# Patient Record
Sex: Female | Born: 1945
Health system: Southern US, Community
[De-identification: ages and names within clinical notes are randomized; demographics above are authoritative.]

## PROBLEM LIST (undated history)

## (undated) DIAGNOSIS — Z201 Contact with and (suspected) exposure to tuberculosis: Secondary | ICD-10-CM

## (undated) DIAGNOSIS — I10 Essential (primary) hypertension: Secondary | ICD-10-CM

## (undated) DIAGNOSIS — Z205 Contact with and (suspected) exposure to viral hepatitis: Secondary | ICD-10-CM

## (undated) DIAGNOSIS — N814 Uterovaginal prolapse, unspecified: Secondary | ICD-10-CM

## (undated) DIAGNOSIS — N390 Urinary tract infection, site not specified: Secondary | ICD-10-CM

## (undated) DIAGNOSIS — D219 Benign neoplasm of connective and other soft tissue, unspecified: Secondary | ICD-10-CM

## (undated) DIAGNOSIS — C801 Malignant (primary) neoplasm, unspecified: Secondary | ICD-10-CM

## (undated) DIAGNOSIS — E119 Type 2 diabetes mellitus without complications: Secondary | ICD-10-CM

## (undated) DIAGNOSIS — D649 Anemia, unspecified: Secondary | ICD-10-CM

## (undated) DIAGNOSIS — D179 Benign lipomatous neoplasm, unspecified: Secondary | ICD-10-CM

## (undated) DIAGNOSIS — Z923 Personal history of irradiation: Secondary | ICD-10-CM

## (undated) DIAGNOSIS — N189 Chronic kidney disease, unspecified: Secondary | ICD-10-CM

## (undated) HISTORY — DX: Essential (primary) hypertension: I10

## (undated) HISTORY — DX: Uterovaginal prolapse, unspecified: N81.4

## (undated) HISTORY — DX: Contact with and (suspected) exposure to viral hepatitis: Z20.5

## (undated) HISTORY — DX: Type 2 diabetes mellitus without complications: E11.9

## (undated) HISTORY — PX: OTHER SURGICAL HISTORY: SHX169

## (undated) HISTORY — DX: Contact with and (suspected) exposure to tuberculosis: Z20.1

---

## 1971-01-15 DIAGNOSIS — Z201 Contact with and (suspected) exposure to tuberculosis: Secondary | ICD-10-CM

## 1971-01-15 HISTORY — DX: Contact with and (suspected) exposure to tuberculosis: Z20.1

## 1998-05-30 ENCOUNTER — Other Ambulatory Visit: Admission: RE | Admit: 1998-05-30 | Discharge: 1998-05-30 | Payer: Self-pay | Admitting: Gynecology

## 1998-10-18 ENCOUNTER — Other Ambulatory Visit: Admission: RE | Admit: 1998-10-18 | Discharge: 1998-10-18 | Payer: Self-pay | Admitting: Gynecology

## 1998-10-18 ENCOUNTER — Encounter (INDEPENDENT_AMBULATORY_CARE_PROVIDER_SITE_OTHER): Payer: Self-pay

## 2004-08-23 ENCOUNTER — Other Ambulatory Visit: Admission: RE | Admit: 2004-08-23 | Discharge: 2004-08-23 | Payer: Self-pay | Admitting: Gynecology

## 2005-08-12 ENCOUNTER — Ambulatory Visit (HOSPITAL_COMMUNITY): Admission: RE | Admit: 2005-08-12 | Discharge: 2005-08-12 | Payer: Self-pay | Admitting: Internal Medicine

## 2005-09-23 ENCOUNTER — Ambulatory Visit (HOSPITAL_BASED_OUTPATIENT_CLINIC_OR_DEPARTMENT_OTHER): Admission: RE | Admit: 2005-09-23 | Discharge: 2005-09-23 | Payer: Self-pay | Admitting: General Surgery

## 2005-09-23 ENCOUNTER — Encounter (INDEPENDENT_AMBULATORY_CARE_PROVIDER_SITE_OTHER): Payer: Self-pay | Admitting: *Deleted

## 2006-12-22 ENCOUNTER — Ambulatory Visit (HOSPITAL_COMMUNITY): Admission: RE | Admit: 2006-12-22 | Discharge: 2006-12-22 | Payer: Self-pay | Admitting: Gynecology

## 2006-12-29 ENCOUNTER — Other Ambulatory Visit: Admission: RE | Admit: 2006-12-29 | Discharge: 2006-12-29 | Payer: Self-pay | Admitting: Gynecology

## 2008-12-19 ENCOUNTER — Encounter: Admission: RE | Admit: 2008-12-19 | Discharge: 2008-12-19 | Payer: Self-pay | Admitting: Gynecology

## 2010-02-04 ENCOUNTER — Encounter: Payer: Self-pay | Admitting: Gynecology

## 2010-02-08 ENCOUNTER — Encounter
Admission: RE | Admit: 2010-02-08 | Discharge: 2010-02-08 | Payer: Self-pay | Source: Home / Self Care | Attending: Gynecology | Admitting: Gynecology

## 2010-05-29 ENCOUNTER — Encounter (INDEPENDENT_AMBULATORY_CARE_PROVIDER_SITE_OTHER): Payer: Self-pay | Admitting: Surgery

## 2010-06-01 NOTE — Op Note (Signed)
NAME:  Jamie Macdonald, Jamie Macdonald                   ACCOUNT NO.:  [id number]   MEDICAL RECORD NO.:  [id number]          PATIENT TYPE:  AMB   LOCATION:  [facility]                         FACILITY:  [facility]   PHYSICIAN:  Timothy E. Earlene Plater, M.D. DATE OF BIRTH:  21-May-1945   DATE OF PROCEDURE:  DATE OF DISCHARGE:                                 OPERATIVE REPORT   PREOPERATIVE DIAGNOSIS:  Lipoma, left shoulder.   POSTOPERATIVE DIAGNOSIS:  Lipoma, left shoulder.   PROCEDURES PERFORMED:  Excision, lipoma, left shoulder.   SURGEON:  Timothy E. Earlene Plater, M.D.   ANESTHESIA:  Local with standby.   [name] is otherwise healthy 43 with an enlarging, painful, chronic lipoma  posterior left shoulder.  She wishes now to have this removed.  It does  measure 10 x 11 cm vertical horizontal.  She is seen, identified, the area  marked and the permit signed.   She is taken to the operating room where she positions herself right  lateral, left shoulder exposed.  IV sedation given until she is comfortable.  The area is prepped and draped in the usual fashion.  Marcaine 0.5% with  epinephrine is used throughout for local anesthesia.  The skin is carefully  marked along the skin line.  A skin incision is made, and the multilobulated  lipoma is carefully dissected from the surrounding, otherwise normal tissue.  There are no unusual characteristics.  It is completely removed.  Bleeding  is carefully controlled with the Bovie, and it measures 13 x 11 cm when  measured flat.  Because of the large amount of space I did place a 10-mm  flat drain, brought it through the inferior skin margins, sutured that to  the skin with a 2-0 nylon, carefully inspected the wound again.  It is all  clean and dry.  The wound is closed in layers with 3-0 Vicryl, 4-0 Monocryl,  Steri-Strips.  Final counts correct.  She tolerated it well and was removed  to the recovery room in good condition.  Written and verbal instructions  given to her and her  daughters, and she will be followed in the office.      Timothy E. Earlene Plater, M.D.  Electronically Signed     TED/MEDQ  D:  09/23/2005  T:  09/24/2005  Job:  [id number]

## 2010-06-19 ENCOUNTER — Other Ambulatory Visit: Payer: Self-pay | Admitting: Gynecology

## 2012-02-19 ENCOUNTER — Other Ambulatory Visit: Payer: Self-pay | Admitting: Gynecology

## 2012-02-19 DIAGNOSIS — Z1231 Encounter for screening mammogram for malignant neoplasm of breast: Secondary | ICD-10-CM

## 2012-02-25 ENCOUNTER — Ambulatory Visit: Payer: Self-pay

## 2012-10-27 ENCOUNTER — Other Ambulatory Visit (HOSPITAL_COMMUNITY)
Admission: RE | Admit: 2012-10-27 | Discharge: 2012-10-27 | Disposition: A | Discharge status: Home / Self Care | Payer: Medicare Other | Source: Ambulatory Visit | Attending: Family Medicine | Admitting: Family Medicine

## 2012-10-27 ENCOUNTER — Other Ambulatory Visit: Payer: Self-pay | Admitting: Family Medicine

## 2012-10-27 DIAGNOSIS — Z124 Encounter for screening for malignant neoplasm of cervix: Secondary | ICD-10-CM | POA: Insufficient documentation

## 2013-04-06 ENCOUNTER — Other Ambulatory Visit: Payer: Self-pay

## 2013-04-06 DIAGNOSIS — Z1231 Encounter for screening mammogram for malignant neoplasm of breast: Secondary | ICD-10-CM

## 2013-04-08 ENCOUNTER — Ambulatory Visit
Admission: RE | Admit: 2013-04-08 | Discharge: 2013-04-08 | Disposition: A | Discharge status: Home / Self Care | Payer: Medicare Other | Source: Ambulatory Visit

## 2013-04-08 DIAGNOSIS — Z1231 Encounter for screening mammogram for malignant neoplasm of breast: Secondary | ICD-10-CM

## 2013-04-15 ENCOUNTER — Other Ambulatory Visit: Payer: Self-pay | Admitting: Obstetrics & Gynecology

## 2013-04-15 ENCOUNTER — Other Ambulatory Visit: Payer: Self-pay

## 2013-04-15 DIAGNOSIS — N644 Mastodynia: Secondary | ICD-10-CM

## 2013-04-21 ENCOUNTER — Ambulatory Visit
Admission: RE | Admit: 2013-04-21 | Discharge: 2013-04-21 | Disposition: A | Discharge status: Home / Self Care | Payer: Medicare Other | Source: Ambulatory Visit | Attending: Obstetrics & Gynecology | Admitting: Obstetrics & Gynecology

## 2013-04-21 ENCOUNTER — Ambulatory Visit
Admission: RE | Admit: 2013-04-21 | Discharge: 2013-04-21 | Disposition: A | Discharge status: Home / Self Care | Payer: Self-pay | Source: Ambulatory Visit | Attending: Obstetrics & Gynecology | Admitting: Obstetrics & Gynecology

## 2013-04-21 DIAGNOSIS — N644 Mastodynia: Secondary | ICD-10-CM

## 2014-03-18 ENCOUNTER — Other Ambulatory Visit: Payer: Self-pay | Admitting: Obstetrics & Gynecology

## 2014-03-18 DIAGNOSIS — N644 Mastodynia: Secondary | ICD-10-CM

## 2014-03-22 ENCOUNTER — Other Ambulatory Visit: Payer: Self-pay

## 2014-03-22 DIAGNOSIS — E2839 Other primary ovarian failure: Secondary | ICD-10-CM

## 2014-04-27 ENCOUNTER — Ambulatory Visit
Admission: RE | Admit: 2014-04-27 | Discharge: 2014-04-27 | Disposition: A | Discharge status: Home / Self Care | Payer: Medicare Other | Source: Ambulatory Visit | Attending: Obstetrics & Gynecology | Admitting: Obstetrics & Gynecology

## 2014-04-27 ENCOUNTER — Other Ambulatory Visit: Payer: Self-pay | Admitting: Obstetrics & Gynecology

## 2014-04-27 DIAGNOSIS — N644 Mastodynia: Secondary | ICD-10-CM

## 2016-04-11 DIAGNOSIS — E119 Type 2 diabetes mellitus without complications: Secondary | ICD-10-CM | POA: Diagnosis not present

## 2016-04-11 DIAGNOSIS — H5210 Myopia, unspecified eye: Secondary | ICD-10-CM | POA: Diagnosis not present

## 2016-04-11 DIAGNOSIS — I10 Essential (primary) hypertension: Secondary | ICD-10-CM | POA: Diagnosis not present

## 2016-04-11 DIAGNOSIS — H2513 Age-related nuclear cataract, bilateral: Secondary | ICD-10-CM | POA: Diagnosis not present

## 2016-04-23 DIAGNOSIS — Z1389 Encounter for screening for other disorder: Secondary | ICD-10-CM | POA: Diagnosis not present

## 2016-04-23 DIAGNOSIS — N189 Chronic kidney disease, unspecified: Secondary | ICD-10-CM | POA: Diagnosis not present

## 2016-04-23 DIAGNOSIS — N183 Chronic kidney disease, stage 3 (moderate): Secondary | ICD-10-CM | POA: Diagnosis not present

## 2016-04-23 DIAGNOSIS — I10 Essential (primary) hypertension: Secondary | ICD-10-CM | POA: Diagnosis not present

## 2016-04-23 DIAGNOSIS — F419 Anxiety disorder, unspecified: Secondary | ICD-10-CM | POA: Diagnosis not present

## 2016-04-23 DIAGNOSIS — E1122 Type 2 diabetes mellitus with diabetic chronic kidney disease: Secondary | ICD-10-CM | POA: Diagnosis not present

## 2016-04-23 DIAGNOSIS — E781 Pure hyperglyceridemia: Secondary | ICD-10-CM | POA: Diagnosis not present

## 2016-04-23 DIAGNOSIS — M810 Age-related osteoporosis without current pathological fracture: Secondary | ICD-10-CM | POA: Diagnosis not present

## 2016-04-23 DIAGNOSIS — Z0000 Encounter for general adult medical examination without abnormal findings: Secondary | ICD-10-CM | POA: Diagnosis not present

## 2016-05-03 DIAGNOSIS — N183 Chronic kidney disease, stage 3 (moderate): Secondary | ICD-10-CM | POA: Diagnosis not present

## 2016-05-09 DIAGNOSIS — I129 Hypertensive chronic kidney disease with stage 1 through stage 4 chronic kidney disease, or unspecified chronic kidney disease: Secondary | ICD-10-CM | POA: Diagnosis not present

## 2016-05-09 DIAGNOSIS — N183 Chronic kidney disease, stage 3 (moderate): Secondary | ICD-10-CM | POA: Diagnosis not present

## 2016-05-09 DIAGNOSIS — E1122 Type 2 diabetes mellitus with diabetic chronic kidney disease: Secondary | ICD-10-CM | POA: Diagnosis not present

## 2016-05-14 DIAGNOSIS — Z1211 Encounter for screening for malignant neoplasm of colon: Secondary | ICD-10-CM | POA: Diagnosis not present

## 2016-05-23 DIAGNOSIS — M545 Low back pain: Secondary | ICD-10-CM | POA: Diagnosis not present

## 2016-05-24 ENCOUNTER — Other Ambulatory Visit: Payer: Self-pay | Admitting: Obstetrics & Gynecology

## 2016-05-24 ENCOUNTER — Other Ambulatory Visit: Payer: Self-pay

## 2016-05-24 DIAGNOSIS — Z1239 Encounter for other screening for malignant neoplasm of breast: Secondary | ICD-10-CM

## 2016-05-31 ENCOUNTER — Ambulatory Visit: Payer: Medicare HMO

## 2016-05-31 DIAGNOSIS — Z1231 Encounter for screening mammogram for malignant neoplasm of breast: Secondary | ICD-10-CM | POA: Diagnosis not present

## 2016-05-31 DIAGNOSIS — Z1239 Encounter for other screening for malignant neoplasm of breast: Secondary | ICD-10-CM

## 2016-06-12 DIAGNOSIS — N94819 Vulvodynia, unspecified: Secondary | ICD-10-CM | POA: Diagnosis not present

## 2016-06-12 DIAGNOSIS — N949 Unspecified condition associated with female genital organs and menstrual cycle: Secondary | ICD-10-CM | POA: Diagnosis not present

## 2016-06-12 DIAGNOSIS — N952 Postmenopausal atrophic vaginitis: Secondary | ICD-10-CM | POA: Diagnosis not present

## 2016-06-12 DIAGNOSIS — N814 Uterovaginal prolapse, unspecified: Secondary | ICD-10-CM | POA: Diagnosis not present

## 2016-08-28 DIAGNOSIS — N812 Incomplete uterovaginal prolapse: Secondary | ICD-10-CM | POA: Diagnosis not present

## 2016-08-28 DIAGNOSIS — N952 Postmenopausal atrophic vaginitis: Secondary | ICD-10-CM | POA: Diagnosis not present

## 2016-10-29 DIAGNOSIS — Z23 Encounter for immunization: Secondary | ICD-10-CM | POA: Diagnosis not present

## 2016-11-05 DIAGNOSIS — N183 Chronic kidney disease, stage 3 (moderate): Secondary | ICD-10-CM | POA: Diagnosis not present

## 2016-11-11 DIAGNOSIS — N183 Chronic kidney disease, stage 3 (moderate): Secondary | ICD-10-CM | POA: Diagnosis not present

## 2016-11-11 DIAGNOSIS — E1122 Type 2 diabetes mellitus with diabetic chronic kidney disease: Secondary | ICD-10-CM | POA: Diagnosis not present

## 2016-11-11 DIAGNOSIS — I129 Hypertensive chronic kidney disease with stage 1 through stage 4 chronic kidney disease, or unspecified chronic kidney disease: Secondary | ICD-10-CM | POA: Diagnosis not present

## 2016-11-18 DIAGNOSIS — M810 Age-related osteoporosis without current pathological fracture: Secondary | ICD-10-CM | POA: Diagnosis not present

## 2016-11-18 DIAGNOSIS — E559 Vitamin D deficiency, unspecified: Secondary | ICD-10-CM | POA: Diagnosis not present

## 2016-11-18 DIAGNOSIS — E1122 Type 2 diabetes mellitus with diabetic chronic kidney disease: Secondary | ICD-10-CM | POA: Diagnosis not present

## 2016-11-18 DIAGNOSIS — N183 Chronic kidney disease, stage 3 (moderate): Secondary | ICD-10-CM | POA: Diagnosis not present

## 2016-11-22 DIAGNOSIS — N183 Chronic kidney disease, stage 3 (moderate): Secondary | ICD-10-CM | POA: Diagnosis not present

## 2016-11-22 DIAGNOSIS — E1122 Type 2 diabetes mellitus with diabetic chronic kidney disease: Secondary | ICD-10-CM | POA: Diagnosis not present

## 2016-11-22 DIAGNOSIS — M810 Age-related osteoporosis without current pathological fracture: Secondary | ICD-10-CM | POA: Diagnosis not present

## 2016-11-22 DIAGNOSIS — E559 Vitamin D deficiency, unspecified: Secondary | ICD-10-CM | POA: Diagnosis not present

## 2016-12-30 DIAGNOSIS — M810 Age-related osteoporosis without current pathological fracture: Secondary | ICD-10-CM | POA: Diagnosis not present

## 2016-12-30 DIAGNOSIS — N189 Chronic kidney disease, unspecified: Secondary | ICD-10-CM | POA: Diagnosis not present

## 2016-12-30 DIAGNOSIS — I10 Essential (primary) hypertension: Secondary | ICD-10-CM | POA: Diagnosis not present

## 2016-12-30 DIAGNOSIS — E1122 Type 2 diabetes mellitus with diabetic chronic kidney disease: Secondary | ICD-10-CM | POA: Diagnosis not present

## 2016-12-30 DIAGNOSIS — Z7984 Long term (current) use of oral hypoglycemic drugs: Secondary | ICD-10-CM | POA: Diagnosis not present

## 2016-12-30 DIAGNOSIS — E119 Type 2 diabetes mellitus without complications: Secondary | ICD-10-CM | POA: Diagnosis not present

## 2017-04-11 DIAGNOSIS — H5210 Myopia, unspecified eye: Secondary | ICD-10-CM | POA: Diagnosis not present

## 2017-04-11 DIAGNOSIS — E119 Type 2 diabetes mellitus without complications: Secondary | ICD-10-CM | POA: Diagnosis not present

## 2017-04-11 DIAGNOSIS — I10 Essential (primary) hypertension: Secondary | ICD-10-CM | POA: Diagnosis not present

## 2017-05-28 DIAGNOSIS — N183 Chronic kidney disease, stage 3 (moderate): Secondary | ICD-10-CM | POA: Diagnosis not present

## 2017-06-03 DIAGNOSIS — I129 Hypertensive chronic kidney disease with stage 1 through stage 4 chronic kidney disease, or unspecified chronic kidney disease: Secondary | ICD-10-CM | POA: Diagnosis not present

## 2017-06-03 DIAGNOSIS — E1122 Type 2 diabetes mellitus with diabetic chronic kidney disease: Secondary | ICD-10-CM | POA: Diagnosis not present

## 2017-06-03 DIAGNOSIS — N183 Chronic kidney disease, stage 3 (moderate): Secondary | ICD-10-CM | POA: Diagnosis not present

## 2017-06-05 DIAGNOSIS — I10 Essential (primary) hypertension: Secondary | ICD-10-CM | POA: Diagnosis not present

## 2017-06-05 DIAGNOSIS — E1122 Type 2 diabetes mellitus with diabetic chronic kidney disease: Secondary | ICD-10-CM | POA: Diagnosis not present

## 2017-06-05 DIAGNOSIS — N183 Chronic kidney disease, stage 3 (moderate): Secondary | ICD-10-CM | POA: Diagnosis not present

## 2017-06-05 DIAGNOSIS — Z7984 Long term (current) use of oral hypoglycemic drugs: Secondary | ICD-10-CM | POA: Diagnosis not present

## 2017-06-05 DIAGNOSIS — E781 Pure hyperglyceridemia: Secondary | ICD-10-CM | POA: Diagnosis not present

## 2017-09-22 DIAGNOSIS — H524 Presbyopia: Secondary | ICD-10-CM | POA: Diagnosis not present

## 2017-09-22 DIAGNOSIS — H5213 Myopia, bilateral: Secondary | ICD-10-CM | POA: Diagnosis not present

## 2017-10-13 DIAGNOSIS — Z23 Encounter for immunization: Secondary | ICD-10-CM | POA: Diagnosis not present

## 2017-11-18 DIAGNOSIS — N183 Chronic kidney disease, stage 3 (moderate): Secondary | ICD-10-CM | POA: Diagnosis not present

## 2017-11-18 DIAGNOSIS — M810 Age-related osteoporosis without current pathological fracture: Secondary | ICD-10-CM | POA: Diagnosis not present

## 2017-11-18 DIAGNOSIS — E1122 Type 2 diabetes mellitus with diabetic chronic kidney disease: Secondary | ICD-10-CM | POA: Diagnosis not present

## 2017-11-18 DIAGNOSIS — E781 Pure hyperglyceridemia: Secondary | ICD-10-CM | POA: Diagnosis not present

## 2017-11-25 DIAGNOSIS — I10 Essential (primary) hypertension: Secondary | ICD-10-CM | POA: Diagnosis not present

## 2017-11-25 DIAGNOSIS — N183 Chronic kidney disease, stage 3 (moderate): Secondary | ICD-10-CM | POA: Diagnosis not present

## 2017-11-25 DIAGNOSIS — E1122 Type 2 diabetes mellitus with diabetic chronic kidney disease: Secondary | ICD-10-CM | POA: Diagnosis not present

## 2017-11-25 DIAGNOSIS — Z1211 Encounter for screening for malignant neoplasm of colon: Secondary | ICD-10-CM | POA: Diagnosis not present

## 2017-11-26 DIAGNOSIS — Z1211 Encounter for screening for malignant neoplasm of colon: Secondary | ICD-10-CM | POA: Diagnosis not present

## 2017-11-27 DIAGNOSIS — N183 Chronic kidney disease, stage 3 (moderate): Secondary | ICD-10-CM | POA: Diagnosis not present

## 2017-11-28 DIAGNOSIS — N814 Uterovaginal prolapse, unspecified: Secondary | ICD-10-CM | POA: Diagnosis not present

## 2017-11-28 DIAGNOSIS — N898 Other specified noninflammatory disorders of vagina: Secondary | ICD-10-CM | POA: Diagnosis not present

## 2017-11-28 DIAGNOSIS — N952 Postmenopausal atrophic vaginitis: Secondary | ICD-10-CM | POA: Diagnosis not present

## 2017-11-28 DIAGNOSIS — N939 Abnormal uterine and vaginal bleeding, unspecified: Secondary | ICD-10-CM | POA: Diagnosis not present

## 2017-12-04 DIAGNOSIS — N183 Chronic kidney disease, stage 3 (moderate): Secondary | ICD-10-CM | POA: Diagnosis not present

## 2017-12-04 DIAGNOSIS — E1122 Type 2 diabetes mellitus with diabetic chronic kidney disease: Secondary | ICD-10-CM | POA: Diagnosis not present

## 2017-12-04 DIAGNOSIS — I129 Hypertensive chronic kidney disease with stage 1 through stage 4 chronic kidney disease, or unspecified chronic kidney disease: Secondary | ICD-10-CM | POA: Diagnosis not present

## 2017-12-17 DIAGNOSIS — N950 Postmenopausal bleeding: Secondary | ICD-10-CM | POA: Diagnosis not present

## 2017-12-17 DIAGNOSIS — N952 Postmenopausal atrophic vaginitis: Secondary | ICD-10-CM | POA: Diagnosis not present

## 2017-12-17 DIAGNOSIS — F418 Other specified anxiety disorders: Secondary | ICD-10-CM | POA: Diagnosis not present

## 2017-12-17 DIAGNOSIS — N814 Uterovaginal prolapse, unspecified: Secondary | ICD-10-CM | POA: Diagnosis not present

## 2017-12-17 DIAGNOSIS — N858 Other specified noninflammatory disorders of uterus: Secondary | ICD-10-CM | POA: Diagnosis not present

## 2017-12-31 DIAGNOSIS — Z4689 Encounter for fitting and adjustment of other specified devices: Secondary | ICD-10-CM | POA: Diagnosis not present

## 2017-12-31 DIAGNOSIS — N814 Uterovaginal prolapse, unspecified: Secondary | ICD-10-CM | POA: Diagnosis not present

## 2017-12-31 DIAGNOSIS — N952 Postmenopausal atrophic vaginitis: Secondary | ICD-10-CM | POA: Diagnosis not present

## 2017-12-31 DIAGNOSIS — F418 Other specified anxiety disorders: Secondary | ICD-10-CM | POA: Diagnosis not present

## 2018-01-02 DIAGNOSIS — Z0000 Encounter for general adult medical examination without abnormal findings: Secondary | ICD-10-CM | POA: Diagnosis not present

## 2018-01-02 DIAGNOSIS — Z7984 Long term (current) use of oral hypoglycemic drugs: Secondary | ICD-10-CM | POA: Diagnosis not present

## 2018-01-02 DIAGNOSIS — R9431 Abnormal electrocardiogram [ECG] [EKG]: Secondary | ICD-10-CM | POA: Diagnosis not present

## 2018-01-02 DIAGNOSIS — M810 Age-related osteoporosis without current pathological fracture: Secondary | ICD-10-CM | POA: Diagnosis not present

## 2018-01-02 DIAGNOSIS — I10 Essential (primary) hypertension: Secondary | ICD-10-CM | POA: Diagnosis not present

## 2018-01-02 DIAGNOSIS — E1122 Type 2 diabetes mellitus with diabetic chronic kidney disease: Secondary | ICD-10-CM | POA: Diagnosis not present

## 2018-01-02 DIAGNOSIS — N183 Chronic kidney disease, stage 3 (moderate): Secondary | ICD-10-CM | POA: Diagnosis not present

## 2018-01-02 DIAGNOSIS — Z01818 Encounter for other preprocedural examination: Secondary | ICD-10-CM | POA: Diagnosis not present

## 2018-01-02 DIAGNOSIS — F419 Anxiety disorder, unspecified: Secondary | ICD-10-CM | POA: Diagnosis not present

## 2018-01-12 ENCOUNTER — Telehealth: Payer: Self-pay

## 2018-01-12 NOTE — Telephone Encounter (Signed)
Error

## 2018-01-12 NOTE — Telephone Encounter (Signed)
-----   Message from Corinna Lines sent at 01/12/2018  8:05 AM EST ----- Regarding: questions about appointment   Good Morning Jamie Macdonald  This patient has an appointment with Dr. Oval Linsey on 01-16-18 and has questions as to whether or not she needs another EKG with Dr. Oval Linsey and other questions regarding her appointment.  Thanks [facility]

## 2018-01-12 NOTE — Telephone Encounter (Signed)
Call and spoke with pt regarding the staff message received below. Adv pt that she will more than likely need to have an EKG repeated when she sees Dr.Spreckels on 01/16/18 for consult. Pt was ref by per pcp for abnormal EKG and pt is scheduled for sx in late Jan 2020. Adv pt that we will be able to access a copy of her EKG for Dr.Magnolia to review at her visit. She will need to contact her insurance co regarding any co-pays that will be associated with the appt. Verified date and location of the appt. Pt questions answered nothing further needed at this time.

## 2018-01-13 ENCOUNTER — Encounter: Payer: Self-pay | Admitting: *Deleted

## 2018-01-16 ENCOUNTER — Ambulatory Visit: Payer: Medicare HMO | Admitting: Cardiovascular Disease

## 2018-01-16 ENCOUNTER — Encounter: Payer: Self-pay | Admitting: Cardiovascular Disease

## 2018-01-16 VITALS — BP 144/80 | HR 86 | Ht 60.0 in | Wt 141.2 lb

## 2018-01-16 DIAGNOSIS — N814 Uterovaginal prolapse, unspecified: Secondary | ICD-10-CM | POA: Diagnosis not present

## 2018-01-16 DIAGNOSIS — Z01818 Encounter for other preprocedural examination: Secondary | ICD-10-CM | POA: Diagnosis not present

## 2018-01-16 DIAGNOSIS — I10 Essential (primary) hypertension: Secondary | ICD-10-CM

## 2018-01-16 DIAGNOSIS — E119 Type 2 diabetes mellitus without complications: Secondary | ICD-10-CM

## 2018-01-16 HISTORY — DX: Essential (primary) hypertension: I10

## 2018-01-16 HISTORY — DX: Uterovaginal prolapse, unspecified: N81.4

## 2018-01-16 HISTORY — DX: Type 2 diabetes mellitus without complications: E11.9

## 2018-01-16 NOTE — Patient Instructions (Signed)
Medication Instructions:  NO CHANGE If you need a refill on your cardiac medications before your next appointment, please call your pharmacy.   Lab work: If you have labs (blood work) drawn today and your tests are completely normal, you will receive your results only by: . MyChart Message (if you have MyChart) OR . A paper copy in the mail If you have any lab test that is abnormal or we need to change your treatment, we will call you to review the results.  Follow-Up: At [facility], you and your health needs are our priority.  As part of our continuing mission to provide you with exceptional heart care, we have created designated Provider Care Teams.  These Care Teams include your primary Cardiologist (physician) and Advanced Practice Providers (APPs -  Physician Assistants and Nurse Practitioners) who all work together to provide you with the care you need, when you need it. Your physician recommends that you schedule a follow-up appointment in: AS NEEDED     

## 2018-01-16 NOTE — Progress Notes (Signed)
Cardiology Office Note   Date:  01/16/2018   ID:  Jamie Macdonald, DOB 08/21/1945, MRN [id number]  PCP:  Leighton Ruff, MD  Cardiologist:   Skeet Latch, MD   Chief Complaint  Patient presents with  . Medical Clearance    biopsy on uterus     History of Present Illness: Jamie Macdonald is a 73 y.o. female with diabetes and hypertension who is being seen today for the evaluation of presurgical risk assessment at the request of Leighton Ruff, MD.  Jamie Macdonald is scheduled for [facility] and possible surgery for uterine prolapse/uterine bladder prolapse.She had an EKG that reported ventricular couplets though none were seen on the tracing.  She was referred to cardiology for clearance.  In general she has been feeling well.  She previously liked to walk for 2 miles daily.  However she had to stop doing that due to urinary urgency.  She develops a lot of pressure in her lower abdomen that makes it hard for her to walk long distances.  She has no exertional chest pain or shortness of breath.  She also denies lower extremity edema, orthopnea, or PND.  She had palpitations that lasted a few seconds.  This occurred twice over the last year.  It is not associated with lightheadedness or dizziness.  It is not associated with exertion.  She is able to walk around [place] without exertional symptoms.  She is concerned about the estrogen therapy that was prescribed.  She is also very averse to taking any medications and is concerned about potential side effects.  She brings a log of her blood pressure at home showing that it has been in the 314H systolic.    Past Medical History:  Diagnosis Date  . Diabetes mellitus   . Diabetes mellitus type 2 in nonobese ([employer]) 01/16/2018  . Essential hypertension 01/16/2018  . Exposure to hepatitis C   . Hypertension    SLIGHT  . Uterine prolapse 01/16/2018    Past Surgical History:  Procedure Laterality Date  . CERVICAL CONING  74 OR 75     Current Outpatient  Medications  Medication Sig Dispense Refill  . Cholecalciferol (VITAMIN D) 50 MCG (2000 UT) tablet Take 2,000 Units by mouth daily.    . Flax Oil-Fish Oil-Borage Oil (FISH OIL-FLAX OIL-BORAGE OIL PO) Take by mouth.    Marland Kitchen lisinopril (PRINIVIL,ZESTRIL) 10 MG tablet Take 10 mg by mouth daily.    . metFORMIN (GLUCOPHAGE) 500 MG tablet Take 1 tablet by mouth 2 (two) times daily.    . Multiple Vitamins-Minerals (MULTIVITAMIN ADULT PO) Take by mouth.     No current facility-administered medications for this visit.     Allergies:   Patient has no known allergies.    Social History:  The patient  reports that she has never smoked. She has never used smokeless tobacco. She reports that she does not drink alcohol or use drugs.   Family History:  The patient's family history includes Diabetes in her father and sister; Heart disease in her father; Prostate cancer in her father; Stroke in her mother.    ROS:  Please see the history of present illness.   Otherwise, review of systems are positive for none.   All other systems are reviewed and negative.    PHYSICAL EXAM: VS:  BP (!) 144/80   Pulse 86   Ht 5' (1.524 m)   Wt 141 lb 3.2 oz (64 kg)   BMI 27.58 kg/m  ,  BMI Body mass index is 27.58 kg/m. GENERAL:  Well appearing HEENT:  Pupils equal round and reactive, fundi not visualized, oral mucosa unremarkable NECK:  No jugular venous distention, waveform within normal limits, carotid upstroke brisk and symmetric, no bruits LUNGS:  Clear to auscultation bilaterally HEART:  RRR.  PMI not displaced or sustained,S1 and S2 within normal limits, no S3, no S4, no clicks, no rubs, no murmurs ABD:  Flat, positive bowel sounds normal in frequency in pitch, no bruits, no rebound, no guarding, no midline pulsatile mass, no hepatomegaly, no splenomegaly EXT:  2 plus pulses throughout, no edema, no cyanosis no clubbing SKIN:  No rashes no nodules NEURO:  Cranial nerves II through XII grossly intact, motor  grossly intact throughout PSYCH:  Cognitively intact, oriented to person place and time   EKG:  EKG is ordered today. The ekg ordered today demonstrates sinus rhythm.  Rate 86 bpm.   Recent Labs: No results found for requested labs within last 8760 hours.   11/18/2017: Sodium 137, potassium 4.8, BUN 24, creatinine 1.5 AST 20, ALT 17 Hemoglobin A1c 5.8% Total cholesterol 156, triglycerides 94, HDL 72, LDL 65   Lipid Panel No results found for: CHOL, TRIG, HDL, CHOLHDL, VLDL, LDLCALC, LDLDIRECT    Wt Readings from Last 3 Encounters:  01/16/18 141 lb 3.2 oz (64 kg)      ASSESSMENT AND PLAN:  # Pre-surgical risk assessment: The patient does not have any unstable cardiac conditions.  Upon evaluation today, she can achieve 4 METs or greater without anginal symptoms.  According to [facility] and AHA guidelines, she requires no further cardiac workup prior to her noncardiac surgery and should be at acceptable risk.  her NSQIP risk of peri-procedural MI or cardiac arrest is 0.2%.  Our service is available as necessary in the perioperative period.  No abnormalities were noted on her prior EKG or the one performed in our office today.  # Hypertension:  BP is poorly controlled here but has been well-controlled at home.  She is quite anxious and averse to medication.  Continue lisinopril and continue to monitor at home.  I recommended that she bring her BP machine to her next PCP appointment.    Current medicines are reviewed at length with the patient today.  The patient does not have concerns regarding medicines.  The following changes have been made:  no change  Labs/ tests ordered today include:   Orders Placed This Encounter  Procedures  . EKG 12-Lead     Disposition:   FU with Jamie Mcfadden C. Jamie Linsey, MD, [facility] as needed     Signed, [place] Jamie Linsey, MD, [facility]  01/16/2018 9:49 AM    [place]

## 2018-01-22 ENCOUNTER — Other Ambulatory Visit: Payer: Self-pay

## 2018-01-22 ENCOUNTER — Encounter (HOSPITAL_BASED_OUTPATIENT_CLINIC_OR_DEPARTMENT_OTHER): Payer: Self-pay | Admitting: *Deleted

## 2018-01-22 DIAGNOSIS — N858 Other specified noninflammatory disorders of uterus: Secondary | ICD-10-CM | POA: Diagnosis not present

## 2018-01-22 DIAGNOSIS — F418 Other specified anxiety disorders: Secondary | ICD-10-CM | POA: Diagnosis not present

## 2018-01-22 DIAGNOSIS — N950 Postmenopausal bleeding: Secondary | ICD-10-CM | POA: Diagnosis not present

## 2018-01-22 DIAGNOSIS — N952 Postmenopausal atrophic vaginitis: Secondary | ICD-10-CM | POA: Diagnosis not present

## 2018-01-22 DIAGNOSIS — N814 Uterovaginal prolapse, unspecified: Secondary | ICD-10-CM | POA: Diagnosis not present

## 2018-01-22 DIAGNOSIS — Z01818 Encounter for other preprocedural examination: Secondary | ICD-10-CM | POA: Diagnosis not present

## 2018-01-26 ENCOUNTER — Encounter (HOSPITAL_BASED_OUTPATIENT_CLINIC_OR_DEPARTMENT_OTHER)
Admission: RE | Admit: 2018-01-26 | Discharge: 2018-01-26 | Disposition: A | Discharge status: Home / Self Care | Payer: Medicare HMO | Source: Ambulatory Visit | Attending: Obstetrics & Gynecology | Admitting: Obstetrics & Gynecology

## 2018-01-26 DIAGNOSIS — Z01812 Encounter for preprocedural laboratory examination: Secondary | ICD-10-CM | POA: Insufficient documentation

## 2018-01-26 LAB — COMPREHENSIVE METABOLIC PANEL
ALBUMIN: 3.9 g/dL (ref 3.5–5.0)
ALT: 17 U/L (ref 0–44)
AST: 22 U/L (ref 15–41)
Alkaline Phosphatase: 92 U/L (ref 38–126)
Anion gap: 6 (ref 5–15)
BUN: 22 mg/dL (ref 8–23)
CALCIUM: 9.9 mg/dL (ref 8.9–10.3)
CO2: 25 mmol/L (ref 22–32)
Chloride: 106 mmol/L (ref 98–111)
Creatinine, Ser: 1.5 mg/dL — ABNORMAL HIGH (ref 0.44–1.00)
GFR calc Af Amer: 40 mL/min — ABNORMAL LOW (ref >60)
GFR calc non Af Amer: 34 mL/min — ABNORMAL LOW (ref >60)
Glucose, Bld: 172 mg/dL — ABNORMAL HIGH (ref 70–99)
Potassium: 4.7 mmol/L (ref 3.5–5.1)
SODIUM: 137 mmol/L (ref 135–145)
Total Bilirubin: 0.6 mg/dL (ref 0.3–1.2)
Total Protein: 7 g/dL (ref 6.5–8.1)

## 2018-01-26 LAB — CBC
HCT: 39.5 % (ref 36.0–46.0)
Hemoglobin: 12.9 g/dL (ref 12.0–15.0)
MCH: 28.2 pg (ref 26.0–34.0)
MCHC: 32.7 g/dL (ref 30.0–36.0)
MCV: 86.4 fL (ref 80.0–100.0)
Platelets: 191 10*3/uL (ref 150–400)
RBC: 4.57 MIL/uL (ref 3.87–5.11)
RDW: 12.8 % (ref 11.5–15.5)
WBC: 4.6 10*3/uL (ref 4.0–10.5)
nRBC: 0 % (ref 0.0–0.2)

## 2018-01-26 NOTE — Progress Notes (Signed)
Pt evaluated by Dr Marcie Bal for anesthesia consult. She had many questions and concerns regarding her kidney function and staying hydrated for surgery. Per Dr Marcie Bal, pt is ok for day surgery center, and is allowed to drink clear liquids until 0530 DOS. Pt verbalized understanding.

## 2018-01-27 NOTE — H&P (Signed)
73yo PM female who presents for [facility], D&C and possible polypectomy- scheduled for 1/17 due to postmenopausal bleeding. She is feeling better than she was before, but did have one trace episode of light pink spotting 2 weeks ago, no further bleeding since that time. Pessary in place and trying to use the estradiol cream as best as she can. In review, she had reported a few days of bright red spotting. It was unclear if the bleeding was due to atrophy vs another underlying etiology. [place] completed today: 7.3 cm uterus with 3.5 post submucosal fibroid. Fluid-filled endometrium with ? hyperechoic fundal mass ~ 2.6cm, no blood flow noted. Neither ovary visualized. Last time she had mentioned that she thought it was related to her activity level, but that has not changed -POP: Currently pessary in place and denies any issues. Using estradiol cream. In review, she was previously seen by Dr. Zigmund Daniel to discuss management options and was placed with a pessary- she wishes to reconsider surgery once the bleeidng issue has been clarified.   Current Medications  Taking   Metformin HCl 500 MG Tablet 1 tablet Orally Twice a day, Notes: BARNES   Lisinopril 10 MG Tablet 1 tablet Orally Once a day, Notes: BARNES   Accu-Chek Aviva Plus(Blood Glucose Test) w/Device Kit as directed once a day testing (Dx E11.9), Notes: BARNES   Accu-Chek AvivaTest Strp(Blood Glucose Test) as directed twice a day Dx E11.22, Notes: BARNES   Accu-Chek Softclix Lancets - Miscellaneous as directed twice a day Dx E11.22, Notes: BARNES   Fish Oil with flax seed daily, Notes: OTC   Multivitamin . Tablet 1 tablet by mouth Once daily, Notes: OTC   Vitamin C daily, Notes: OTC   Vitamin D3 2000 UNIT Capsule 1 capsule Orally Once a day, Notes: OTC   Premarin(Estrogens Conjugated) 0.625 MG/GM Cream as directed Vaginal   Medication List reviewed and reconciled with the patient    Past Medical History  Diabetes.   Dysphagia with vomiting.   History  of anxiety and depression.   chronic kidney disease (Dr. Neta Ehlers).   Type 2 diabetes mellitus with diabetic chronic kidney disease (her nephrologist does not want her on statins as long as the cholesterol is okay.).   Hypertension.   vitamin D deficiency.   osteoporosis, refused Prolia (Dr.Levy).   GYN, Dr. Nelda Marseille.   Dentist, currently seeking a provider.   Ophtha, Dr. Kenton Kingfisher, [place].           Surgical History  surgical coning 1975  fatty tumor removal from shoulder 2000  tubal ligation    Family History  Father: deceased, prostate cancer, heart problems, diabetes, diagnosed with Diabetes, Prostate CA  Mother: deceased, parkinsons, stroke, CVA  Sister 1: deceased, diabetes, hypertension, Diabetes, Hypertension  Patient states that she does have children but wishes to not list them on family history // denies any GYN family cancer hx/dc // No known family history of colon cancer.   Social History  General:  Alcohol: yes, rare.  Children: two.  Caffeine: yes, mostly decaf, coffee in the am sometimes.  DIET: healthy, regular.  Tobacco use  cigarettes: Never smoked Tobacco history last updated 01/02/2018 Marital Status: single, Divorced.  no Recreational drug use.  OCCUPATION: retired.  Exercise: yes, walking 2-3 miles daily.    Gyn History  Sexual activity not currently sexually active.  Periods : postmenopausal.  LMP 2001.  Last pap smear date 10/27/12 Normal.  Last mammogram date 05/31/2016-normal .  Denies H/O Abnormal pap smear.  Denies H/O STD.  Other: Does have a pessary but rarely uses it.    OB History  Pregnancy # 1 live birth, vaginal delivery, girl.  Pregnancy # 2 live birth, vaginal delivery, girl.    Allergies  N.K.D.A.   Hospitalization/Major Diagnostic Procedure  childbirth x 2   Not in the past year 12/2017   Review of Systems  CONSTITUTIONAL:  no Appetite changes. no Chills. no Fatigue. no Fever.  CARDIOLOGY:  no Chest pain.   RESPIRATORY:  no Shortness of breath. no Cough.  GASTROENTEROLOGY:  no Abdominal pain. no Change in bowel habits. no Change in bowel movements.  FEMALE REPRODUCTIVE:  no Abnormal vaginal discharge. no Breast lumps or discharge. no Breast pain. no Hot flashes. Sexual problems N/A. Vaginal irritation yes, see HPI. no Vaginal itching.  NEUROLOGY:  no Dizziness. no Headache.  PSYCHOLOGY:  no Anxiety. no Depression.  SKIN:  no Rash. no Hives.  HEMATOLOGY/LYMPH:  no Anemia. Using Blood Thinners no.     Vital Signs  Wt 140.7, Wt change -2.3 lb, Ht 60, BMI 27.48, BP sitting 130/80.   Examination  General Examination: CONSTITUTIONAL: well developed, well nourished.  SKIN: warm and dry, no rashes.  NECK: supple, normal appearance.  LUNGS: clear to auscultation bilaterally, no wheezes, rhonchi, rales.  HEART: no murmurs, regular rate and rhythm.  ABDOMEN: soft and not tender, no rebound, no rigidity.  MUSCULOSKELETAL no calf tenderness bilaterally.  EXTREMITIES: no edema present.  NEUROLOGIC EXAM: alert and oriented x 3.  PSYCH: appropriate mood and affect.     A/P: 73yo postmenopausal female who presents for hysteroscopy, D&C, myosure polypectomy -NPO -LR @ 125cc/hr -SCDs to OR -Risk/benefits reviewed with patient including but not limited to risk of bleeding, infection and potential injury to surrounding organs due to uterine perforation.  Pt aware, questions were addressed and desires to proceed  Janyth Pupa, DO [phone] (cell) [phone] (office)

## 2018-01-29 ENCOUNTER — Encounter (HOSPITAL_BASED_OUTPATIENT_CLINIC_OR_DEPARTMENT_OTHER): Payer: Self-pay | Admitting: Anesthesiology

## 2018-01-29 NOTE — Anesthesia Preprocedure Evaluation (Addendum)
Anesthesia Evaluation  Patient identified by MRN, date of birth, ID band Patient awake    Reviewed: Allergy & Precautions, NPO status , Patient's Chart, lab work & pertinent test results  Airway Mallampati: II  TM Distance: >3 FB Neck ROM: Full    Dental  (+) Teeth Intact, Dental Advisory Given   Pulmonary neg pulmonary ROS,    breath sounds clear to auscultation       Cardiovascular hypertension, Pt. on medications  Rhythm:Regular Rate:Normal     Neuro/Psych negative neurological ROS     GI/Hepatic negative GI ROS, Neg liver ROS,   Endo/Other  diabetes, Type 2, Oral Hypoglycemic Agents  Renal/GU Renal InsufficiencyRenal disease     Musculoskeletal negative musculoskeletal ROS (+)   Abdominal Normal abdominal exam  (+)   Peds  Hematology negative hematology ROS (+)   Anesthesia Other Findings   Reproductive/Obstetrics                            Anesthesia Physical Anesthesia Plan  ASA: II  Anesthesia Plan: General   Post-op Pain Management:    Induction: Intravenous  PONV Risk Score and Plan: 4 or greater and Ondansetron, Dexamethasone, Midazolam and Scopolamine patch - Pre-op  Airway Management Planned: LMA  Additional Equipment: None  Intra-op Plan:   Post-operative Plan: Extubation in OR  Informed Consent:   Plan Discussed with: CRNA  Anesthesia Plan Comments:         Anesthesia Quick Evaluation

## 2018-01-30 ENCOUNTER — Encounter

## 2018-01-30 ENCOUNTER — Ambulatory Visit (HOSPITAL_BASED_OUTPATIENT_CLINIC_OR_DEPARTMENT_OTHER)
Admission: RE | Admit: 2018-01-30 | Discharge: 2018-01-30 | Disposition: A | Discharge status: Home / Self Care | Payer: Medicare HMO | Attending: Obstetrics & Gynecology | Admitting: Obstetrics & Gynecology

## 2018-01-30 ENCOUNTER — Other Ambulatory Visit: Payer: Self-pay

## 2018-01-30 ENCOUNTER — Ambulatory Visit (HOSPITAL_BASED_OUTPATIENT_CLINIC_OR_DEPARTMENT_OTHER): Payer: Medicare HMO | Admitting: Anesthesiology

## 2018-01-30 ENCOUNTER — Encounter (HOSPITAL_BASED_OUTPATIENT_CLINIC_OR_DEPARTMENT_OTHER): Payer: Self-pay | Admitting: Certified Registered"

## 2018-01-30 ENCOUNTER — Encounter (HOSPITAL_BASED_OUTPATIENT_CLINIC_OR_DEPARTMENT_OTHER)
Admission: RE | Disposition: A | Discharge status: Home / Self Care | Payer: Self-pay | Source: Home / Self Care | Attending: Obstetrics & Gynecology

## 2018-01-30 DIAGNOSIS — M810 Age-related osteoporosis without current pathological fracture: Secondary | ICD-10-CM | POA: Insufficient documentation

## 2018-01-30 DIAGNOSIS — E559 Vitamin D deficiency, unspecified: Secondary | ICD-10-CM | POA: Diagnosis not present

## 2018-01-30 DIAGNOSIS — Z79890 Hormone replacement therapy (postmenopausal): Secondary | ICD-10-CM | POA: Diagnosis not present

## 2018-01-30 DIAGNOSIS — Z833 Family history of diabetes mellitus: Secondary | ICD-10-CM | POA: Insufficient documentation

## 2018-01-30 DIAGNOSIS — Z79899 Other long term (current) drug therapy: Secondary | ICD-10-CM | POA: Insufficient documentation

## 2018-01-30 DIAGNOSIS — E1122 Type 2 diabetes mellitus with diabetic chronic kidney disease: Secondary | ICD-10-CM | POA: Insufficient documentation

## 2018-01-30 DIAGNOSIS — C541 Malignant neoplasm of endometrium: Secondary | ICD-10-CM | POA: Diagnosis not present

## 2018-01-30 DIAGNOSIS — N858 Other specified noninflammatory disorders of uterus: Secondary | ICD-10-CM | POA: Diagnosis not present

## 2018-01-30 DIAGNOSIS — Z8249 Family history of ischemic heart disease and other diseases of the circulatory system: Secondary | ICD-10-CM | POA: Diagnosis not present

## 2018-01-30 DIAGNOSIS — I129 Hypertensive chronic kidney disease with stage 1 through stage 4 chronic kidney disease, or unspecified chronic kidney disease: Secondary | ICD-10-CM | POA: Insufficient documentation

## 2018-01-30 DIAGNOSIS — Z7984 Long term (current) use of oral hypoglycemic drugs: Secondary | ICD-10-CM | POA: Insufficient documentation

## 2018-01-30 DIAGNOSIS — E119 Type 2 diabetes mellitus without complications: Secondary | ICD-10-CM | POA: Diagnosis not present

## 2018-01-30 DIAGNOSIS — N189 Chronic kidney disease, unspecified: Secondary | ICD-10-CM | POA: Insufficient documentation

## 2018-01-30 DIAGNOSIS — N950 Postmenopausal bleeding: Secondary | ICD-10-CM | POA: Diagnosis not present

## 2018-01-30 DIAGNOSIS — I10 Essential (primary) hypertension: Secondary | ICD-10-CM | POA: Diagnosis not present

## 2018-01-30 HISTORY — PX: DILATATION & CURETTAGE/HYSTEROSCOPY WITH MYOSURE: SHX6511

## 2018-01-30 HISTORY — DX: Chronic kidney disease, unspecified: N18.9

## 2018-01-30 LAB — GLUCOSE, CAPILLARY
Glucose-Capillary: 149 mg/dL — ABNORMAL HIGH (ref 70–99)
Glucose-Capillary: 144 mg/dL — ABNORMAL HIGH (ref 70–99)

## 2018-01-30 SURGERY — DILATATION & CURETTAGE/HYSTEROSCOPY WITH MYOSURE
Anesthesia: General | Site: Perineum

## 2018-01-30 MED ORDER — SILVER NITRATE-POT NITRATE 75-25 % EX MISC
CUTANEOUS | Status: AC
  Filled 2018-01-30: qty 1

## 2018-01-30 MED ORDER — GLYCOPYRROLATE PF 0.2 MG/ML IJ SOSY
PREFILLED_SYRINGE | INTRAMUSCULAR | Status: AC
  Filled 2018-01-30: qty 1

## 2018-01-30 MED ORDER — PROPOFOL 10 MG/ML IV BOLUS
INTRAVENOUS | Status: DC | PRN
  Administered 2018-01-30: 100 mg via INTRAVENOUS

## 2018-01-30 MED ORDER — ONDANSETRON HCL 4 MG/2ML IJ SOLN
INTRAMUSCULAR | Status: DC | PRN
  Administered 2018-01-30: 4 mg via INTRAVENOUS

## 2018-01-30 MED ORDER — DEXMEDETOMIDINE HCL IN NACL 200 MCG/50ML IV SOLN
INTRAVENOUS | Status: AC
  Filled 2018-01-30: qty 50

## 2018-01-30 MED ORDER — SODIUM CHLORIDE 0.9 % IR SOLN
Status: DC | PRN
  Administered 2018-01-30: 1

## 2018-01-30 MED ORDER — MIDAZOLAM HCL 2 MG/2ML IJ SOLN: 1.0000 mg | INTRAMUSCULAR | Status: DC | PRN

## 2018-01-30 MED ORDER — LACTATED RINGERS IV SOLN: INTRAVENOUS | Status: DC

## 2018-01-30 MED ORDER — LIDOCAINE-EPINEPHRINE 0.5 %-1:200000 IJ SOLN
INTRAMUSCULAR | Status: AC
  Filled 2018-01-30: qty 1

## 2018-01-30 MED ORDER — FENTANYL CITRATE (PF) 100 MCG/2ML IJ SOLN
INTRAMUSCULAR | Status: AC
  Filled 2018-01-30: qty 2

## 2018-01-30 MED ORDER — LIDOCAINE-EPINEPHRINE 1 %-1:100000 IJ SOLN
INTRAMUSCULAR | Status: DC | PRN
  Administered 2018-01-30: 20 mL

## 2018-01-30 MED ORDER — LIDOCAINE 2% (20 MG/ML) 5 ML SYRINGE
INTRAMUSCULAR | Status: AC
  Filled 2018-01-30: qty 15

## 2018-01-30 MED ORDER — DEXAMETHASONE SODIUM PHOSPHATE 4 MG/ML IJ SOLN
INTRAMUSCULAR | Status: DC | PRN
  Administered 2018-01-30: 8 mg via INTRAVENOUS

## 2018-01-30 MED ORDER — FENTANYL CITRATE (PF) 100 MCG/2ML IJ SOLN
50.0000 ug | INTRAMUSCULAR | Status: DC | PRN
  Administered 2018-01-30: 50 ug via INTRAVENOUS

## 2018-01-30 MED ORDER — PHENYLEPHRINE HCL 10 MG/ML IJ SOLN
INTRAMUSCULAR | Status: DC | PRN
  Administered 2018-01-30 (×2): 40 ug via INTRAVENOUS

## 2018-01-30 MED ORDER — PROMETHAZINE HCL 25 MG/ML IJ SOLN: 6.2500 mg | INTRAMUSCULAR | Status: DC | PRN

## 2018-01-30 MED ORDER — SCOPOLAMINE 1 MG/3DAYS TD PT72: 1.0000 | MEDICATED_PATCH | Freq: Once | TRANSDERMAL | Status: DC | PRN

## 2018-01-30 MED ORDER — CIPROFLOXACIN-DEXAMETHASONE 0.3-0.1 % OT SUSP
OTIC | Status: AC
  Filled 2018-01-30: qty 7.5

## 2018-01-30 MED ORDER — DEXAMETHASONE SODIUM PHOSPHATE 10 MG/ML IJ SOLN
INTRAMUSCULAR | Status: AC
  Filled 2018-01-30: qty 3

## 2018-01-30 MED ORDER — LIDOCAINE-EPINEPHRINE 1 %-1:100000 IJ SOLN
INTRAMUSCULAR | Status: AC
  Filled 2018-01-30: qty 1

## 2018-01-30 MED ORDER — FENTANYL CITRATE (PF) 100 MCG/2ML IJ SOLN: 25.0000 ug | INTRAMUSCULAR | Status: DC | PRN

## 2018-01-30 MED ORDER — ONDANSETRON HCL 4 MG/2ML IJ SOLN
INTRAMUSCULAR | Status: AC
  Filled 2018-01-30: qty 10

## 2018-01-30 MED ORDER — OXYCODONE HCL 5 MG/5ML PO SOLN: 5.0000 mg | Freq: Once | ORAL | Status: DC | PRN

## 2018-01-30 MED ORDER — MEPERIDINE HCL 25 MG/ML IJ SOLN: 6.2500 mg | INTRAMUSCULAR | Status: DC | PRN

## 2018-01-30 MED ORDER — ACETAMINOPHEN 500 MG PO TABS: 1000.0000 mg | ORAL_TABLET | Freq: Once | ORAL | Status: DC

## 2018-01-30 MED ORDER — LACTATED RINGERS IV SOLN
INTRAVENOUS | Status: DC
  Administered 2018-01-30: 07:00:00 via INTRAVENOUS

## 2018-01-30 MED ORDER — OXYCODONE HCL 5 MG PO TABS: 5.0000 mg | ORAL_TABLET | Freq: Once | ORAL | Status: DC | PRN

## 2018-01-30 MED ORDER — LIDOCAINE HCL (CARDIAC) PF 100 MG/5ML IV SOSY
PREFILLED_SYRINGE | INTRAVENOUS | Status: DC | PRN
  Administered 2018-01-30: 60 mg via INTRAVENOUS

## 2018-01-30 SURGICAL SUPPLY — 24 items
BRIEF STRETCH FOR OB PAD XXL (UNDERPADS AND DIAPERS) ×2 IMPLANT
CANISTER SUCT 3000ML PPV (MISCELLANEOUS) ×1 IMPLANT
CATH ROBINSON RED A/P 16FR (CATHETERS) ×2 IMPLANT
DEVICE MYOSURE LITE (MISCELLANEOUS) IMPLANT
DEVICE MYOSURE REACH (MISCELLANEOUS) ×1 IMPLANT
DILATOR CANAL MILEX (MISCELLANEOUS) IMPLANT
GLOVE BIO SURGEON STRL SZ 6.5 (GLOVE) ×1 IMPLANT
GLOVE BIO SURGEON STRL SZ7 (GLOVE) ×1 IMPLANT
GLOVE BIOGEL PI IND STRL 6.5 (GLOVE) ×1 IMPLANT
GLOVE BIOGEL PI IND STRL 7.0 (GLOVE) ×1 IMPLANT
GLOVE BIOGEL PI IND STRL 7.5 (GLOVE) IMPLANT
GLOVE BIOGEL PI INDICATOR 6.5 (GLOVE) ×1
GLOVE BIOGEL PI INDICATOR 7.0 (GLOVE) ×2
GLOVE BIOGEL PI INDICATOR 7.5 (GLOVE) ×2
GLOVE ECLIPSE 6.5 STRL STRAW (GLOVE) ×2 IMPLANT
GOWN STRL REUS W/ TWL LRG LVL3 (GOWN DISPOSABLE) IMPLANT
GOWN STRL REUS W/TWL LRG LVL3 (GOWN DISPOSABLE) ×7 IMPLANT
KIT PROCEDURE FLUENT (KITS) ×2 IMPLANT
PACK VAGINAL MINOR WOMEN LF (CUSTOM PROCEDURE TRAY) ×2 IMPLANT
PAD OB MATERNITY 4.3X12.25 (PERSONAL CARE ITEMS) ×2 IMPLANT
PAD PREP 24X48 CUFFED NSTRL (MISCELLANEOUS) ×2 IMPLANT
SEAL ROD LENS SCOPE MYOSURE (ABLATOR) ×2 IMPLANT
SLEEVE SCD COMPRESS KNEE MED (MISCELLANEOUS) ×2 IMPLANT
TOWEL GREEN STERILE FF (TOWEL DISPOSABLE) ×4 IMPLANT

## 2018-01-30 NOTE — Op Note (Signed)
Operative Report  PreOp: postmenopausal bleeding, uterine mass PostOp: same Procedure:  Hysteroscopy, Dilation and Curettage, Endometrial ablation Surgeon: Dr. Janyth Pupa Anesthesia: General Complications:none EBL: 65KP UOP: 30cc IVF:800 Discrepancy: 320cc  Findings:7cm anteverted uterus, thickened endometrium with 2 small polyps noted within cavity, ostia visualized bilaterally  Specimens: 1) endometrial curetting with polyp  Procedure: The patient was taken to the operating room where she underwent general anesthesia without difficulty. The patient was placed in a low lithotomy position using Allen stirrups. The patient was examined with the findings as noted above.  She was then prepped and draped in the normal sterile fashion. The bladder was drained using a red rubber urethral catheter. A sterile speculum was inserted into the vagina. A single tooth tenaculum was placed on the anterior lip of the cervix. The uterus was then sounded to 7cm. The endocervical canal was then serially dilated to using Hank dilators.  The diagnostic hysteroscope was then inserted without difficulty and noted to have the findings as listed above. Visualization was achieved using NS as a distending medium. The myosure Reach was then used for resection of the uterine polyps.  The hysteroscope was removed and sharp curettage was performed. The tissue was sent to pathology. The hysteroscope was reinserted, no uterine perforation was seen. All instrument were then removed. Hemostasis was observed at the cervical site. The patient was repositioned to the supine position. The patient tolerated the procedure without any complications and taken to recovery in stable condition.   Janyth Pupa, DO [phone] (pager) [phone] (office)

## 2018-01-30 NOTE — Transfer of Care (Signed)
Immediate Anesthesia Transfer of Care Note  Patient: Jamie Macdonald  Procedure(s) Performed: DILATATION & CURETTAGE/HYSTEROSCOPY WITH MYOSURE (N/A )  Patient Location: PACU  Anesthesia Type:General  Level of Consciousness: sedated and patient cooperative  Airway & Oxygen Therapy: Patient Spontanous Breathing and Patient connected to face mask oxygen  Post-op Assessment: Report given to RN and Post -op Vital signs reviewed and stable  Post vital signs: Reviewed and stable  Last Vitals:  Vitals Value Taken Time  BP    Temp    Pulse 101 01/30/2018  8:10 AM  Resp    SpO2 100 % 01/30/2018  8:10 AM  Vitals shown include unvalidated device data.  Last Pain:  Vitals:   01/30/18 0634  TempSrc: Oral  PainSc: 0-No pain      Patients Stated Pain Goal: 0 (44/73/95 [id number])  Complications: No apparent anesthesia complications

## 2018-01-30 NOTE — Anesthesia Postprocedure Evaluation (Signed)
Anesthesia Post Note  Patient: Jamie Macdonald  Procedure(s) Performed: DILATATION & CURETTAGE/HYSTEROSCOPY WITH MYOSURE (N/A Perineum)     Patient location during evaluation: PACU Anesthesia Type: General Level of consciousness: awake and alert Pain management: pain level controlled Vital Signs Assessment: post-procedure vital signs reviewed and stable Respiratory status: spontaneous breathing, nonlabored ventilation, respiratory function stable and patient connected to nasal cannula oxygen Cardiovascular status: blood pressure returned to baseline and stable Postop Assessment: no apparent nausea or vomiting Anesthetic complications: no    Last Vitals:  Vitals:   01/30/18 0830 01/30/18 0848  BP:    Pulse:    Resp: 16 18  Temp:  36.5 C  SpO2: 98% 97%    Last Pain:  Vitals:   01/30/18 0848  TempSrc:   PainSc: 0-No pain                 Effie Berkshire

## 2018-01-30 NOTE — Interval H&P Note (Signed)
History and Physical Interval Note:  01/30/2018 7:04 AM  [place]  has presented today for surgery, with the diagnosis of N85.8 uterine mass N95.0 postmenopausal bleeding  The various methods of treatment have been discussed with the patient and family. After consideration of risks, benefits and other options for treatment, the patient has consented to  Procedure(s): [place] (N/A) as a surgical intervention .  The patient's history has been reviewed, patient examined, no change in status, stable for surgery.  I have reviewed the patient's chart and labs.  Questions were answered to the patient's satisfaction.     Annalee Genta

## 2018-01-30 NOTE — Discharge Instructions (Addendum)
HOME INSTRUCTIONS  Please note any unusual or excessive bleeding, pain, swelling. Mild dizziness or drowsiness are normal for about 24 hours after surgery.   Shower when comfortable  Restrictions: No driving for 24 hours or while taking pain medications.  Activity:  No heavy lifting (> 10 lbs), nothing in vagina (no tampons, douching, or intercourse) x 2 weeks; no tub baths for 2 weeks Vaginal spotting is expected but if your bleeding is heavy, period like,  please call the office   Diet:  You may return to your regular diet.  Do not eat large meals.  Eat small frequent meals throughout the day.  Continue to drink a good amount of water at least 6-8 glasses of water per day, hydration is very important for the healing process.  Pain Management: Take Tylenol over the counter as needed  Always take prescription pain medication with food, it may cause constipation, increase fluids and fiber and you may want to take an over-the-counter stool softener like Colace as needed up to 2x a day.    Alcohol -- Avoid for 24 hours and while taking pain medications.  Nausea: Take sips of ginger ale or soda  Fever -- Call physician if temperature over 101 degrees  Follow up:  If you do not already have a follow up appointment scheduled, please call the office at [phone].  If you experience fever (a temperature greater than 100.4), pain unrelieved by pain medication, shortness of breath, swelling of a single leg, or any other symptoms which are concerning to you please the office immediately.    Post Anesthesia Home Care Instructions  Activity: Get plenty of rest for the remainder of the day. A responsible individual must stay with you for 24 hours following the procedure.  For the next 24 hours, DO NOT: -Drive a car -Paediatric nurse -Drink alcoholic beverages -Take any medication unless instructed by your physician -Make any legal decisions or sign important papers.  Meals: Start with  liquid foods such as gelatin or soup. Progress to regular foods as tolerated. Avoid greasy, spicy, heavy foods. If nausea and/or vomiting occur, drink only clear liquids until the nausea and/or vomiting subsides. Call your physician if vomiting continues.  Special Instructions/Symptoms: Your throat may feel dry or sore from the anesthesia or the breathing tube placed in your throat during surgery. If this causes discomfort, gargle with warm salt water. The discomfort should disappear within 24 hours.  If you had a scopolamine patch placed behind your ear for the management of post- operative nausea and/or vomiting:  1. The medication in the patch is effective for 72 hours, after which it should be removed.  Wrap patch in a tissue and discard in the trash. Wash hands thoroughly with soap and water. 2. You may remove the patch earlier than 72 hours if you experience unpleasant side effects which may include dry mouth, dizziness or visual disturbances. 3. Avoid touching the patch. Wash your hands with soap and water after contact with the patch.

## 2018-01-30 NOTE — Anesthesia Procedure Notes (Signed)
Procedure Name: LMA Insertion Date/Time: 01/30/2018 7:25 AM Performed by: Signe Colt, CRNA Pre-anesthesia Checklist: Patient identified, Emergency Drugs available, Suction available and Patient being monitored Patient Re-evaluated:Patient Re-evaluated prior to induction Oxygen Delivery Method: Circle system utilized Preoxygenation: Pre-oxygenation with 100% oxygen Induction Type: IV induction Ventilation: Mask ventilation without difficulty LMA: LMA inserted LMA Size: 4.0 Number of attempts: 1 Airway Equipment and Method: Bite block Placement Confirmation: positive ETCO2 Tube secured with: Tape Dental Injury: Teeth and Oropharynx as per pre-operative assessment

## 2018-02-02 ENCOUNTER — Encounter (HOSPITAL_BASED_OUTPATIENT_CLINIC_OR_DEPARTMENT_OTHER): Payer: Self-pay | Admitting: Obstetrics & Gynecology

## 2018-02-02 NOTE — Addendum Note (Signed)
Addendum  created 02/02/18 1215 by Tawni Millers, CRNA   Charge Capture section accepted

## 2018-02-04 ENCOUNTER — Telehealth: Payer: Self-pay | Admitting: *Deleted

## 2018-02-04 NOTE — Telephone Encounter (Signed)
Called and left Jamie Macdonald at [facility] with the appt date/time of 1/31 at 11:30am. Patient not aware of appt yet, placed a hold for the appt.

## 2018-02-06 DIAGNOSIS — Z4689 Encounter for fitting and adjustment of other specified devices: Secondary | ICD-10-CM | POA: Diagnosis not present

## 2018-02-06 DIAGNOSIS — C541 Malignant neoplasm of endometrium: Secondary | ICD-10-CM | POA: Diagnosis not present

## 2018-02-06 DIAGNOSIS — N819 Female genital prolapse, unspecified: Secondary | ICD-10-CM | POA: Diagnosis not present

## 2018-02-11 ENCOUNTER — Telehealth: Payer: Self-pay

## 2018-02-11 ENCOUNTER — Telehealth: Payer: Self-pay | Admitting: *Deleted

## 2018-02-11 NOTE — Telephone Encounter (Addendum)
Patient called regarding her new patient appt this Friday. Patient had several questions and concerns, all that were answered. Patient stated that "I only want to have one surgery if possible, everything done in the same day.Plus I was told that you are under [facility], but I want my surgery with [facility]." Explained to the patient that all her concerns will be addressed at her appt.  Gave the patient the address and phone number to the clinic. Also told the patient about the free valet and pelvic exam.

## 2018-02-11 NOTE — Telephone Encounter (Signed)
Dr. Nelda Marseille wanted to inform Dr. Denman George that Ms Nagorski has very high anxiety about her health and especially so with her new endometrial cancer diagnosis. Ms Makepeace also has horrible prolapse. She uses a pessary. She was going to refer her to Dr. Zigmund Daniel prior to the cancer diagnosis. Dr. Nelda Marseille wanted Dr. Denman George to know this prior to her consultation.  If at all possible to do a joint case to help manage Ms Bedingfield's symptoms. Dr. West Pugh cell number is [phone].

## 2018-02-12 DIAGNOSIS — F418 Other specified anxiety disorders: Secondary | ICD-10-CM | POA: Diagnosis not present

## 2018-02-13 ENCOUNTER — Encounter: Payer: Self-pay | Admitting: Gynecologic Oncology

## 2018-02-13 ENCOUNTER — Inpatient Hospital Stay: Payer: Medicare HMO | Attending: Gynecologic Oncology | Admitting: Gynecologic Oncology

## 2018-02-13 VITALS — BP 157/88 | HR 88 | Temp 98.3°F | Resp 20 | Ht 60.0 in | Wt 140.3 lb

## 2018-02-13 DIAGNOSIS — C541 Malignant neoplasm of endometrium: Secondary | ICD-10-CM | POA: Insufficient documentation

## 2018-02-13 NOTE — Patient Instructions (Signed)
Preparing for your Surgerywith Dr.Emma Denman George at [facility] will be scheduled for a Robotic Assisted Total Hysterectomy, Bilateral Salpingo-oophorectomy, and Sentinel Lymph Node Biopsy.  We will call you with a surgery date once joint case with Dr. Louis Meckel is arranged. Dr. Carlton Adam office will call you with the new patient appointment.   Pre-operative Testing -You will receive a phone call from presurgical testing at [facility] to arrange for a pre-operative testing appointment before your surgery.  This appointment normally occurs one to two weeks before your scheduled surgery.   -Bring your insurance card, copy of an advanced directive if applicable, medication list  -At that visit, you will be asked to sign a consent for a possible blood transfusion in case a transfusion becomes necessary during surgery.  The need for a blood transfusion is rare but having consent is a necessary part of your care.     -You should not be taking blood thinners or aspirin at least ten days prior to surgery unless instructed by your surgeon.  Day Before Surgery at [facility] will be asked to take in a light diet the day before surgery.  Avoid carbonated beverages.  You will be advised to have nothing to eat or drink after midnight the evening before.    Eat a light diet the day before surgery.  Examples including soups, broths, toast, yogurt, mashed potatoes.  Things to avoid include carbonated beverages (fizzy beverages), raw fruits and raw vegetables, or beans.   If your bowels are filled with gas, your surgeon will have difficulty visualizing your pelvic organs which increases your surgical risks.  Your role in recovery Your role is to become active as soon as directed by your doctor, while still giving yourself time to heal.  Rest when you feel tired. You will be asked to do the following in order to speed your recovery:  - Cough and breathe deeply. This helps toclear and expand  your lungs and can prevent pneumonia. You may be given a spirometer to practice deep breathing. A staff member will show you how to use the spirometer. - Do mild physical activity. Walking or moving your legs help your circulation and body functions return to normal. A staff member will help you when you try to walk and will provide you with simple exercises. Do not try to get up or walk alone the first time. - Actively manage your pain. Managing your pain lets you move in comfort. We will ask you to rate your pain on a scale of zero to 10. It is your responsibility to tell your doctor or nurse where and how much you hurt so your pain can be treated.  Special Considerations -If you are diabetic, you may be placed on insulin after surgery to have closer control over your blood sugars to promote healing and recovery.  This does not mean that you will be discharged on insulin.  If applicable, your oral antidiabetics will be resumed when you are tolerating a solid diet.  -Your final pathology results from surgery should be available around one week after surgery and the results will be relayed to you when available.  -Hadley Pen is the Surgeon that assists your GYN Oncologist with surgery.  The next day after your surgery you will either see your GYN Oncologist, Dr. Everitt Amber or Dr. Lahoma Crocker.  -FMLA forms can be faxed to [phone] and please allow 5-7 business days for completion.  Eat a light diet the day  before surgery.  Examples including soups, broths, toast, yogurt, mashed potatoes.  Things to avoid include carbonated beverages (fizzy beverages), raw fruits and raw vegetables, or beans.   If your bowels are filled with gas, your surgeon will have difficulty visualizing your pelvic organs which increases your surgical risks.  Blood Transfusion Information WHAT IS A BLOOD TRANSFUSION? A transfusion is the replacement of blood or some of its parts. Blood is made up of  multiple cells which provide different functions.  Red blood cells carry oxygen and are used for blood loss replacement.  White blood cells fight against infection.  Platelets control bleeding.  Plasma helps clot blood.  Other blood products are available for specialized needs, such as hemophilia or other clotting disorders. BEFORE THE TRANSFUSION  Who gives blood for transfusions?   You may be able to donate blood to be used at a later date on yourself (autologous donation).  Relatives can be asked to donate blood. This is generally not any safer than if you have received blood from a stranger. The same precautions are taken to ensure safety when a relative's blood is donated.  Healthy volunteers who are fully evaluated to make sure their blood is safe. This is blood bank blood. Transfusion therapy is the safest it has ever been in the practice of medicine. Before blood is taken from a donor, a complete history is taken to make sure that person has no history of diseases nor engages in risky social behavior (examples are intravenous drug use or sexual activity with multiple partners). The donor's travel history is screened to minimize risk of transmitting infections, such as malaria. The donated blood is tested for signs of infectious diseases, such as HIV and hepatitis. The blood is then tested to be sure it is compatible with you in order to minimize the chance of a transfusion reaction. If you or a relative donates blood, this is often done in anticipation of surgery and is not appropriate for emergency situations. It takes many days to process the donated blood. RISKS AND COMPLICATIONS Although transfusion therapy is very safe and saves many lives, the main dangers of transfusion include:   Getting an infectious disease.  Developing a transfusion reaction. This is an allergic reaction to something in the blood you were given. Every precaution is taken to prevent this. The decision to  have a blood transfusion has been considered carefully by your caregiver before blood is given. Blood is not given unless the benefits outweigh the risks.

## 2018-02-16 ENCOUNTER — Telehealth: Payer: Self-pay | Admitting: Oncology

## 2018-02-16 NOTE — Progress Notes (Signed)
Consult Note: Gyn-Onc  Consult was requested by Dr. Nelda Marseille for the evaluation of Jamie Macdonald 73 y.o. female  CC:  Chief Complaint  Patient presents with  . Endometrial cancer [facility])    Assessment/Plan:  Jamie. Jamie Macdonald  is a 73 y.o.  year old with FIGO grade 1 endometrioid endometrial cancer and severe symptomatic pelvic organ prolapse. She desires both to be surgically managed at the same time, understanding that this may result in a potential for delay in scheduling surgery know to coordinate procedures.  A detailed discussion was held with the patient and her family with regard to to her endometrial cancer diagnosis. We discussed the standard management options for uterine cancer which includes surgery followed possibly by adjuvant therapy depending on the results of surgery. The options for surgical management include a hysterectomy and removal of the tubes and ovaries possibly with removal of pelvic and para-aortic lymph nodes.If feasible, a minimally invasive approach including a robotic hysterectomy or laparoscopic hysterectomy have benefits including shorter hospital stay, recovery time and better wound healing than with open surgery. The patient has been counseled about these surgical options and the risks of surgery in general including infection, bleeding, damage to surrounding structures (including bowel, bladder, ureters, nerves or vessels), and the postoperative risks of PE/ DVT, and lymphedema. I extensively reviewed the additional risks of robotic hysterectomy including possible need for conversion to open laparotomy.  I discussed positioning during surgery of trendelenberg and risks of minor facial swelling and care we take in preoperative positioning.  After counseling and consideration of her options, she desires to proceed with robotic assisted total hysterectomy with bilateral sapingo-oophorectomy and SLN biopsy.   She will be seen by anesthesia for preoperative clearance and  discussion of postoperative pain management.  She was given the opportunity to ask questions, which were answered to her satisfaction, and she is agreement with the above mentioned plan of care.  We will reach out to Dr. Zigmund Daniel and see if we can facilitate timing of her surgery at [facility] expeditiously.  The patient is interested in [place] also reaching out to our colleagues with [facility] in case we are able to develop a more expeditious consultation and coordinated surgery with them.  I discussed that likely they will recommend abdominal sacrocolpopexy, which may involve the placement of mesh.  The patient has some reservations about mesh placement, however I explained that this is frequently necessary in this type of surgery but will be better counseled by the surgeon who was performing this component of her procedure.   HPI: Jamie Macdonald is a 73 year old woman who is seen in consultation at the request of Dr Nelda Marseille for grade 1 endometrial cancer.   The patient reports a history of occasional bleeding symptoms since the beginning of November 2019.  She was on estradiol while using her pessary which had been placed in 2019 for severe pelvic organ prolapse.  She was evaluated by Dr. Nelda Marseille who performed a D&C on January 30, 2018.  This revealed well differentiated endometrial adenocarcinoma with foci approaching moderate differentiation.  The patient has a history of severe pelvic organ prolapse that is symptomatic.  She saw Dr. [place] Pink in May 2018 who prescribed vaginal estrogen.  She then was seen back later in the year and recommended hysterectomy with a tacking procedure.  The patient was concerned about the use of vaginal mesh and therefore declined at this time.  She subsequently saw Dr. Baruch Goldmann who  placed a vaginal pessary which does help with her symptoms of prolapse however the patient does not like having to wear a pessary and desires definitive surgical management.  Her  medical history is significant for very well controlled type 2 diabetes mellitus.  She regular checks her blood glucose levels and takes oral agent (metformin) but no insulin for this.  Her last hemoglobin A1c was less than 6%.  She also has hypertension and some anxiety and depression.  Her surgical history is significant for a surgical cone and a tubal ligation but no other major abdominal surgeries.  Her family history is unremarkable for malignancy  Current Meds:  Outpatient Encounter Medications as of 02/13/2018  Medication Sig  . Cholecalciferol (VITAMIN D) 50 MCG (2000 UT) tablet Take 2,000 Units by mouth daily.  . Flax Oil-Fish Oil-Borage Oil (FISH OIL-FLAX OIL-BORAGE OIL PO) Take by mouth daily.   Marland Kitchen lisinopril (PRINIVIL,ZESTRIL) 10 MG tablet Take 10 mg by mouth daily.  . metFORMIN (GLUCOPHAGE) 500 MG tablet Take 1 tablet by mouth 2 (two) times daily.  . Multiple Vitamins-Minerals (MULTIVITAMIN ADULT PO) Take by mouth daily.    No facility-administered encounter medications on file as of 02/13/2018.     Allergy:  Allergies  Allergen Reactions  . Nsaids Other (See Comments)    Due to chronic kidney disease pt does not take any celebrex, motrin, aleve etc.    Social Hx:   Social History   Socioeconomic History  . Marital status: Divorced    Spouse name: Not on file  . Number of children: Not on file  . Years of education: Not on file  . Highest education level: Not on file  Occupational History  . Not on file  Social Needs  . Financial resource strain: Not on file  . Food insecurity:    Worry: Not on file    Inability: Not on file  . Transportation needs:    Medical: Not on file    Non-medical: Not on file  Tobacco Use  . Smoking status: Never Smoker  . Smokeless tobacco: Never Used  Substance and Sexual Activity  . Alcohol use: No  . Drug use: Never  . Sexual activity: Not on file  Lifestyle  . Physical activity:    Days per week: Not on file    Minutes per  session: Not on file  . Stress: Not on file  Relationships  . Social connections:    Talks on phone: Not on file    Gets together: Not on file    Attends religious service: Not on file    Active member of club or organization: Not on file    Attends meetings of clubs or organizations: Not on file    Relationship status: Not on file  . Intimate partner violence:    Fear of current or ex partner: Not on file    Emotionally abused: Not on file    Physically abused: Not on file    Forced sexual activity: Not on file  Other Topics Concern  . Not on file  Social History Narrative  . Not on file    Past Surgical Hx:  Past Surgical History:  Procedure Laterality Date  . CERVICAL CONING  74 OR 75  . DILATATION & CURETTAGE/HYSTEROSCOPY WITH MYOSURE N/A 01/30/2018   Procedure: DILATATION & CURETTAGE/HYSTEROSCOPY WITH MYOSURE;  Surgeon: Janyth Pupa, DO;  Location: [facility];  Service: Gynecology;  Laterality: N/A;    Past Medical Hx:  Past Medical  History:  Diagnosis Date  . Chronic kidney disease    ckd  . Diabetes mellitus   . Diabetes mellitus type 2 in nonobese ([place]) 01/16/2018  . Essential hypertension 01/16/2018  . Exposure to hepatitis C   . Exposure to TB 1973  . Hypertension    SLIGHT  . Uterine prolapse 01/16/2018    Past Gynecological History:  See HPI No LMP recorded. Patient is postmenopausal.  Family Hx:  Family History  Problem Relation Age of Onset  . Stroke Mother   . Parkinson's disease Mother   . Heart disease Father        HEART ATTACK  . Diabetes Father   . Prostate cancer Father   . Diabetes Sister   . Cancer Sister     Review of Systems:  Constitutional  Feels well,    ENT Normal appearing ears and nares bilaterally Skin/Breast  No rash, sores, jaundice, itching, dryness Cardiovascular  No chest pain, shortness of breath, or edema  Pulmonary  No cough or wheeze.  Gastro Intestinal  No nausea, vomitting, or diarrhoea. No  bright red blood per rectum, no abdominal pain, change in bowel movement, or constipation.  Genito Urinary  No frequency, urgency, dysuria, + bleeding, + prolapse Musculo Skeletal  No myalgia, arthralgia, joint swelling or pain  Neurologic  No weakness, numbness, change in gait,  Psychology  No depression, anxiety, insomnia.   Vitals:  Blood pressure (!) 157/88, pulse 88, temperature 98.3 F (36.8 C), temperature source Oral, resp. rate 20, height 5' (1.524 m), weight 140 lb 4.8 oz (63.6 kg), SpO2 100 %.  Physical Exam: WD in NAD Neck  Supple NROM, without any enlargements.  Lymph Node Survey No cervical supraclavicular or inguinal adenopathy Cardiovascular  Pulse normal rate, regularity and rhythm. S1 and S2 normal.  Lungs  Clear to auscultation bilateraly, without wheezes/crackles/rhonchi. Good air movement.  Skin  No rash/lesions/breakdown  Psychiatry  Alert and oriented to person, place, and time  Abdomen  Normoactive bowel sounds, abdomen soft, non-tender and not obese without evidence of hernia.  Back No CVA tenderness Genito Urinary  Vulva/vagina: Normal external female genitalia.   No lesions. No discharge or bleeding.  Bladder/urethra:  No lesions or masses, prolapsed bladder  Vagina: (pessary removed for exam, then replaced) + prolapse, no lesions  Cervix: Normal appearing, no lesions.  Uterus:  Small, mobile, no parametrial involvement or nodularity.  Adnexa: no palpable masses. Rectal  deferred Extremities  No bilateral cyanosis, clubbing or edema.   Thereasa Solo, MD  02/16/2018, 4:58 PM

## 2018-02-16 NOTE — Telephone Encounter (Signed)
[facility] and made appointment for patient to see Dr. Louis Meckel on 02/19/18 at 12:15 pm.  Called patient and let her know appointment date, time and location.  She verbalized agreement.

## 2018-02-19 ENCOUNTER — Telehealth: Payer: Self-pay | Admitting: *Deleted

## 2018-02-19 DIAGNOSIS — N819 Female genital prolapse, unspecified: Secondary | ICD-10-CM | POA: Diagnosis not present

## 2018-02-19 NOTE — Telephone Encounter (Signed)
Patient called and stated "I just the urology doctor and he is ok with doing the joint surgery. But now he will be out of the office the last week in February. What does that mean for my surgery? Can we do it earlier or will it have to wait?" Explained to the patient that I will give the message to both Dr. Denman George and Lenna Sciara APP and we will call  Her back tomorrow.

## 2018-02-20 ENCOUNTER — Telehealth: Payer: Self-pay | Admitting: Oncology

## 2018-02-20 NOTE — Telephone Encounter (Signed)
Jamie Macdonald called and asked what her co-pay will be for her surgery.  Gave her patient billing phone number and advised her to call back if they are not able to answer her questions.

## 2018-02-24 ENCOUNTER — Telehealth: Payer: Self-pay | Admitting: Gynecologic Oncology

## 2018-02-24 NOTE — Telephone Encounter (Signed)
Returned call to patient.  Patient has multiple questions about her surgery with Dr. Louis Meckel.  Advised her that these questions would need to be addressed with Dr. Louis Meckel.  Attempted to answer all appropriate questions.  Advised to call for any other needs or concerns.

## 2018-03-02 ENCOUNTER — Other Ambulatory Visit: Payer: Self-pay | Admitting: Gynecologic Oncology

## 2018-03-02 ENCOUNTER — Other Ambulatory Visit: Payer: Self-pay | Admitting: Urology

## 2018-03-02 DIAGNOSIS — R351 Nocturia: Secondary | ICD-10-CM | POA: Diagnosis not present

## 2018-03-02 DIAGNOSIS — R3914 Feeling of incomplete bladder emptying: Secondary | ICD-10-CM | POA: Diagnosis not present

## 2018-03-02 DIAGNOSIS — C541 Malignant neoplasm of endometrium: Secondary | ICD-10-CM

## 2018-03-09 NOTE — Patient Instructions (Signed)
Jamie Macdonald  03/09/2018   Your procedure is scheduled on: 03-19-2018  Report to [facility] Main  Entrance  Report to admitting at 915 AM    Call this number if you have problems the morning of surgery [phone]    Remember: [place], NO [place].  NO SOLID FOOD AFTER MIDNIGHT THE NIGHT PRIOR TO SURGERY. NOTHING BY MOUTH EXCEPT CLEAR LIQUIDS UNTIL 3 HOURS PRIOR TO [place] SURGERY. PLEASE FINISH ENSURE DRINK PER SURGEON ORDER 3 HOURS PRIOR TO SCHEDULED SURGERY TIME WHICH NEEDS TO BE COMPLETED AT 815 am.    CLEAR LIQUID DIET   Foods Allowed                                                                     Foods Excluded  Coffee and tea, regular and decaf                             liquids that you cannot  Plain Jell-O in any flavor                                             see through such as: Fruit ices (not with fruit pulp)                                     milk, soups, orange juice  Iced Popsicles                                    All solid food                                     Cranberry, grape and apple juices Sports drinks like Gatorade Lightly seasoned clear broth or consume(fat free) Sugar, honey syrup  Sample Menu Breakfast                                Lunch                                     Supper Cranberry juice                    Beef broth                            Chicken broth Jell-O  Grape juice                           Apple juice Coffee or tea                        Jell-O                                      Popsicle                                                Coffee or tea                        Coffee or tea Eat a light diet the day before surgery on 03-18-2018.  Examples including soups, broths, toast, yogurt, mashed potatoes.  Things to avoid include carbonated beverages (fizzy beverages), raw fruits and raw  vegetables, or beans.   If your bowels are filled with gas, your surgeon will have difficulty visualizing your pelvic organs which increases your surgical risks. _____________________________________________________________________    Take these medicines the morning of surgery with A SIP OF WATER: none  DO NOT TAKE ANY DIABETIC MEDICATIONS DAY OF YOUR SURGERY                  How to Manage Your Diabetes Before and After Surgery  Why is it important to control my blood sugar before and after surgery? . Improving blood sugar levels before and after surgery helps healing and can limit problems. . A way of improving blood sugar control is eating a healthy diet by: o  Eating less sugar and carbohydrates o  Increasing activity/exercise o  Talking with your doctor about reaching your blood sugar goals . High blood sugars (greater than 180 mg/dL) can raise your risk of infections and slow your recovery, so you will need to focus on controlling your diabetes during the weeks before surgery. . Make sure that the doctor who takes care of your diabetes knows about your planned surgery including the date and location.  How do I manage my blood sugar before surgery? . Check your blood sugar at least 4 times a day, starting 2 days before surgery, to make sure that the level is not too high or low. o Check your blood sugar the morning of your surgery when you wake up and every 2 hours until you get to the Short Stay unit. . If your blood sugar is less than 70 mg/dL, you will need to treat for low blood sugar: o Do not take insulin. o Treat a low blood sugar (less than 70 mg/dL) with  cup of clear juice (cranberry or apple), 4 glucose tablets, OR glucose gel. o Recheck blood sugar in 15 minutes after treatment (to make sure it is greater than 70 mg/dL). If your blood sugar is not greater than 70 mg/dL on recheck, call [phone] for further instructions. . Report your blood sugar to the short stay  nurse when you get to Short Stay.  . If you are admitted to the hospital after surgery: o Your blood sugar will be checked by the staff and you will probably be given insulin after surgery (  instead of oral diabetes medicines) to make sure you have good blood sugar levels. o The goal for blood sugar control after surgery is 80-180 mg/dL.   WHAT DO I DO ABOUT MY DIABETES MEDICATION?  Marland Kitchen Do not take oral diabetes medicines (pills) the morning of surgery.  THE  DAY BEFORE SURGERY TAKE METFORMIN AS USUAL. . THE MORNING OF SURGERY DO NOT TAKE METFORMIN    Reviewed and Endorsed by [facility] Patient Education Committee, August 2015              You may not have any metal on your body including hair pins and              piercings  Do not wear jewelry, make-up, lotions, powders or perfumes, deodorant             Do not wear nail polish.  Do not shave  48 hours prior to surgery.              Men may shave face and neck.   Do not bring valuables to the hospital. [place].  Contacts, dentures or bridgework may not be worn into surgery.  Leave suitcase in the car. After surgery it may be brought to your room.    ___________  [facility] - Preparing for Surgery Before surgery, you can play an important role.  Because skin is not sterile, your skin needs to be as free of germs as possible.  You can reduce the number of germs on your skin by washing with CHG (chlorahexidine gluconate) soap before surgery.  CHG is an antiseptic cleaner which kills germs and bonds with the skin to continue killing germs even after washing. Please DO NOT use if you have an allergy to CHG or antibacterial soaps.  If your skin becomes reddened/irritated stop using the CHG and inform your nurse when you arrive at Short Stay. Do not shave (including legs and underarms) for at least 48 hours prior to the first CHG shower.  You may shave your face/neck. Please follow these  instructions carefully:  1.  Shower with CHG Soap the night before surgery and the  morning of Surgery.  2.  If you choose to wash your hair, wash your hair first as usual with your  normal  shampoo.  3.  After you shampoo, rinse your hair and body thoroughly to remove the  shampoo.                           4.  Use CHG as you would any other liquid soap.  You can apply chg directly  to the skin and wash                       Gently with a scrungie or clean washcloth.  5.  Apply the CHG Soap to your body ONLY FROM THE NECK DOWN.   Do not use on face/ open                           Wound or open sores. Avoid contact with eyes, ears mouth and genitals (private parts).                       Wash face,  Development worker, international aid (private  parts) with your normal soap.             6.  Wash thoroughly, paying special attention to the area where your surgery  will be performed.  7.  Thoroughly rinse your body with warm water from the neck down.  8.  DO NOT shower/wash with your normal soap after using and rinsing off  the CHG Soap.                9.  Pat yourself dry with a clean towel.            10.  Wear clean pajamas.            11.  Place clean sheets on your bed the night of your first shower and do not  sleep with pets. Day of Surgery : Do not apply any lotions/deodorants the morning of surgery.  Please wear clean clothes to the hospital/surgery center.  FAILURE TO FOLLOW THESE INSTRUCTIONS MAY RESULT IN THE CANCELLATION OF YOUR SURGERY PATIENT SIGNATURE_________________________________  NURSE SIGNATURE__________________________________  ________________________________________________________________________   Adam Phenix  An incentive spirometer is a tool that can help keep your lungs clear and active. This tool measures how well you are filling your lungs with each breath. Taking long deep breaths may help reverse or decrease the chance of developing breathing (pulmonary) problems (especially  infection) following:  A long period of time when you are unable to move or be active. BEFORE THE PROCEDURE   If the spirometer includes an indicator to show your best effort, your nurse or respiratory therapist will set it to a desired goal.  If possible, sit up straight or lean slightly forward. Try not to slouch.  Hold the incentive spirometer in an upright position. INSTRUCTIONS FOR USE  1. Sit on the edge of your bed if possible, or sit up as far as you can in bed or on a chair. 2. Hold the incentive spirometer in an upright position. 3. Breathe out normally. 4. Place the mouthpiece in your mouth and seal your lips tightly around it. 5. Breathe in slowly and as deeply as possible, raising the piston or the ball toward the top of the column. 6. Hold your breath for 3-5 seconds or for as long as possible. Allow the piston or ball to fall to the bottom of the column. 7. Remove the mouthpiece from your mouth and breathe out normally. 8. Rest for a few seconds and repeat Steps 1 through 7 at least 10 times every 1-2 hours when you are awake. Take your time and take a few normal breaths between deep breaths. 9. The spirometer may include an indicator to show your best effort. Use the indicator as a goal to work toward during each repetition. 10. After each set of 10 deep breaths, practice coughing to be sure your lungs are clear. If you have an incision (the cut made at the time of surgery), support your incision when coughing by placing a pillow or rolled up towels firmly against it. Once you are able to get out of bed, walk around indoors and cough well. You may stop using the incentive spirometer when instructed by your caregiver.  RISKS AND COMPLICATIONS  Take your time so you do not get dizzy or light-headed.  If you are in pain, you may need to take or ask for pain medication before doing incentive spirometry. It is harder to take a deep breath if you are having pain. AFTER  USE  Rest  and breathe slowly and easily.  It can be helpful to keep track of a log of your progress. Your caregiver can provide you with a simple table to help with this. If you are using the spirometer at home, follow these instructions: [place] IF:   You are having difficultly using the spirometer.  You have trouble using the spirometer as often as instructed.  Your pain medication is not giving enough relief while using the spirometer.  You develop fever of 100.5 F (38.1 C) or higher. SEEK IMMEDIATE MEDICAL CARE IF:   You cough up bloody sputum that had not been present before.  You develop fever of 102 F (38.9 C) or greater.  You develop worsening pain at or near the incision site. MAKE SURE YOU:   Understand these instructions.  Will watch your condition.  Will get help right away if you are not doing well or get worse. Document Released: 05/13/2006 Document Revised: 03/25/2011 Document Reviewed: 07/14/2006 ExitCare Patient Information 2014 ExitCare, [place].   ________________________________________________________________________  WHAT IS A BLOOD TRANSFUSION? Blood Transfusion Information  A transfusion is the replacement of blood or some of its parts. Blood is made up of multiple cells which provide different functions.  Red blood cells carry oxygen and are used for blood loss replacement.  White blood cells fight against infection.  Platelets control bleeding.  Plasma helps clot blood.  Other blood products are available for specialized needs, such as hemophilia or other clotting disorders. BEFORE THE TRANSFUSION  Who gives blood for transfusions?   Healthy volunteers who are fully evaluated to make sure their blood is safe. This is blood bank blood. Transfusion therapy is the safest it has ever been in the practice of medicine. Before blood is taken from a donor, a complete history is taken to make sure that person has no history of diseases  nor engages in risky social behavior (examples are intravenous drug use or sexual activity with multiple partners). The donor's travel history is screened to minimize risk of transmitting infections, such as malaria. The donated blood is tested for signs of infectious diseases, such as HIV and hepatitis. The blood is then tested to be sure it is compatible with you in order to minimize the chance of a transfusion reaction. If you or a relative donates blood, this is often done in anticipation of surgery and is not appropriate for emergency situations. It takes many days to process the donated blood. RISKS AND COMPLICATIONS Although transfusion therapy is very safe and saves many lives, the main dangers of transfusion include:   Getting an infectious disease.  Developing a transfusion reaction. This is an allergic reaction to something in the blood you were given. Every precaution is taken to prevent this. The decision to have a blood transfusion has been considered carefully by your caregiver before blood is given. Blood is not given unless the benefits outweigh the risks. AFTER THE TRANSFUSION  Right after receiving a blood transfusion, you will usually feel much better and more energetic. This is especially true if your red blood cells have gotten low (anemic). The transfusion raises the level of the red blood cells which carry oxygen, and this usually causes an energy increase.  The nurse administering the transfusion will monitor you carefully for complications. HOME CARE INSTRUCTIONS  No special instructions are needed after a transfusion. You may find your energy is better. Speak with your caregiver about any limitations on activity for underlying diseases you may have. [place]  CARE IF:   Your condition is not improving after your transfusion.  You develop redness or irritation at the intravenous (IV) site. SEEK IMMEDIATE MEDICAL CARE IF:  Any of the following symptoms occur over the  next 12 hours:  Shaking chills.  You have a temperature by mouth above 102 F (38.9 C), not controlled by medicine.  Chest, back, or muscle pain.  People around you feel you are not acting correctly or are confused.  Shortness of breath or difficulty breathing.  Dizziness and fainting.  You get a rash or develop hives.  You have a decrease in urine output.  Your urine turns a dark color or changes to pink, red, or brown. Any of the following symptoms occur over the next 10 days:  You have a temperature by mouth above 102 F (38.9 C), not controlled by medicine.  Shortness of breath.  Weakness after normal activity.  The white part of the eye turns yellow (jaundice).  You have a decrease in the amount of urine or are urinating less often.  Your urine turns a dark color or changes to pink, red, or brown. Document Released: 12/29/1999 Document Revised: 03/25/2011 Document Reviewed: 08/17/2007 [facility] Patient Information 2014 [place], [place].  _______________________________________________________________________

## 2018-03-09 NOTE — Progress Notes (Signed)
CARDIAC CLEARANCE NOTE DR [facility] AND EKG BOTH DONE 01-16-2018 EPIC

## 2018-03-11 DIAGNOSIS — R3914 Feeling of incomplete bladder emptying: Secondary | ICD-10-CM | POA: Diagnosis not present

## 2018-03-12 ENCOUNTER — Encounter (HOSPITAL_COMMUNITY): Payer: Self-pay

## 2018-03-12 ENCOUNTER — Encounter (HOSPITAL_COMMUNITY)
Admission: RE | Admit: 2018-03-12 | Discharge: 2018-03-12 | Disposition: A | Discharge status: Home / Self Care | Payer: Medicare HMO | Source: Ambulatory Visit | Attending: Gynecologic Oncology | Admitting: Gynecologic Oncology

## 2018-03-12 ENCOUNTER — Other Ambulatory Visit: Payer: Self-pay

## 2018-03-12 DIAGNOSIS — R3914 Feeling of incomplete bladder emptying: Secondary | ICD-10-CM | POA: Diagnosis not present

## 2018-03-12 DIAGNOSIS — C541 Malignant neoplasm of endometrium: Secondary | ICD-10-CM | POA: Insufficient documentation

## 2018-03-12 DIAGNOSIS — N819 Female genital prolapse, unspecified: Secondary | ICD-10-CM | POA: Diagnosis not present

## 2018-03-12 DIAGNOSIS — Z01812 Encounter for preprocedural laboratory examination: Secondary | ICD-10-CM | POA: Diagnosis not present

## 2018-03-12 DIAGNOSIS — R351 Nocturia: Secondary | ICD-10-CM | POA: Diagnosis not present

## 2018-03-12 DIAGNOSIS — N814 Uterovaginal prolapse, unspecified: Secondary | ICD-10-CM | POA: Diagnosis not present

## 2018-03-12 HISTORY — DX: Benign neoplasm of connective and other soft tissue, unspecified: D21.9

## 2018-03-12 HISTORY — DX: Benign lipomatous neoplasm, unspecified: D17.9

## 2018-03-12 HISTORY — DX: Urinary tract infection, site not specified: N39.0

## 2018-03-12 HISTORY — DX: Malignant (primary) neoplasm, unspecified: C80.1

## 2018-03-12 HISTORY — DX: Anemia, unspecified: D64.9

## 2018-03-12 LAB — COMPREHENSIVE METABOLIC PANEL
ALK PHOS: 94 U/L (ref 38–126)
ALT: 15 U/L (ref 0–44)
AST: 23 U/L (ref 15–41)
Albumin: 4.7 g/dL (ref 3.5–5.0)
Anion gap: 10 (ref 5–15)
BUN: 22 mg/dL (ref 8–23)
CO2: 22 mmol/L (ref 22–32)
CREATININE: 1.35 mg/dL — AB (ref 0.44–1.00)
Calcium: 10 mg/dL (ref 8.9–10.3)
Chloride: 106 mmol/L (ref 98–111)
GFR calc Af Amer: 45 mL/min — ABNORMAL LOW (ref >60)
GFR calc non Af Amer: 39 mL/min — ABNORMAL LOW (ref >60)
Glucose, Bld: 170 mg/dL — ABNORMAL HIGH (ref 70–99)
Potassium: 4.1 mmol/L (ref 3.5–5.1)
Sodium: 138 mmol/L (ref 135–145)
Total Bilirubin: 0.6 mg/dL (ref 0.3–1.2)
Total Protein: 7.4 g/dL (ref 6.5–8.1)

## 2018-03-12 LAB — URINALYSIS, ROUTINE W REFLEX MICROSCOPIC
Bilirubin Urine: NEGATIVE
Glucose, UA: NEGATIVE mg/dL
HGB URINE DIPSTICK: NEGATIVE
Ketones, ur: NEGATIVE mg/dL
NITRITE: NEGATIVE
Protein, ur: NEGATIVE mg/dL
Specific Gravity, Urine: 1.008 (ref 1.005–1.030)
pH: 5 (ref 5.0–8.0)

## 2018-03-12 LAB — CBC
HCT: 39.9 % (ref 36.0–46.0)
Hemoglobin: 13.2 g/dL (ref 12.0–15.0)
MCH: 28.9 pg (ref 26.0–34.0)
MCHC: 33.1 g/dL (ref 30.0–36.0)
MCV: 87.5 fL (ref 80.0–100.0)
Platelets: 188 10*3/uL (ref 150–400)
RBC: 4.56 MIL/uL (ref 3.87–5.11)
RDW: 13.2 % (ref 11.5–15.5)
WBC: 8.2 10*3/uL (ref 4.0–10.5)
nRBC: 0 % (ref 0.0–0.2)

## 2018-03-12 LAB — ABO/RH: ABO/RH(D): O POS

## 2018-03-12 LAB — GLUCOSE, CAPILLARY: Glucose-Capillary: 166 mg/dL — ABNORMAL HIGH (ref 70–99)

## 2018-03-12 LAB — HEMOGLOBIN A1C
Hgb A1c MFr Bld: 5.6 % (ref 4.8–5.6)
Mean Plasma Glucose: 114.02 mg/dL

## 2018-03-12 NOTE — Progress Notes (Signed)
Anesthesia Chart Review   Case:  [id number] Date/Time:  03/19/18 1100   Procedures:      XI ROBOTIC ASSISTED TOTAL HYSTERECTOMY WITH BILATERAL SALPINGO OOPHORECTOMY (Bilateral )     SENTINEL NODE BIOPSY (Bilateral )     XI ROBOTIC ASSISTED LAPAROSCOPIC SACROCOLPOPEXY (N/A )   Anesthesia type:  General   Pre-op diagnosis:  ENDOMETRIAL CANCER, UTERINE PROLAPSE   Location:  [facility] ROOM 03 / [facility]   Surgeon:  Everitt Amber, MD; Ardis Hughs, MD      DISCUSSION: 73 yo never smoker with h/o DM II, anemia, HTN, CKD Stage III, endometrial cancer, uterine prolapse scheduled for above procedure 03/19/18 with Dr. Everitt Amber and Dr. Louis Meckel.   Pt last seen by nephrologist, Dr. Nydia Bouton Nwobu 12/04/17.  At this visit stable.  6 month follow up recommended.  Creatinine 1.35 at PAT visit 03/19/18 which is stable.  Labs reviewed in [place].   Pt referred to cardiology for surgical clearance.  Seen by Dr. Skeet Latch on 01/16/2018. Per her note, "The patient does not have any unstable cardiac conditions.  Upon evaluation today, she can achieve 4 METs or greater without anginal symptoms.  According to [facility] and AHA guidelines, she requires no further cardiac workup prior to her noncardiac surgery and should be at acceptable risk.  her NSQIP risk of peri-procedural MI or cardiac arrest is 0.2%.  Our service is available as necessary in the perioperative period.  No abnormalities were noted on her prior EKG or the one performed in our office today."   Urinalysis routed to [name], NP.  Pt can proceed with planned procedure barring acute status change.  VS: BP (!) 159/69 (BP Location: Left Arm)   Pulse (!) 101   Temp 37.2 C (Oral)   Resp 18   Ht 5' (1.524 m)   SpO2 98%   BMI 27.40 kg/m   PROVIDERS: Leighton Ruff, MD is PCP   Red Christians, MD is Nephrologist   Skeet Latch, MD is Cardiologist  LABS: Labs reviewed: Acceptable for surgery. (all labs ordered are  listed, but only abnormal results are displayed)  Labs Reviewed  GLUCOSE, CAPILLARY - Abnormal; Notable for the following components:      Result Value   Glucose-Capillary 166 (*)    All other components within normal limits  COMPREHENSIVE METABOLIC PANEL - Abnormal; Notable for the following components:   Glucose, Bld 170 (*)    Creatinine, Ser 1.35 (*)    GFR calc non Af Amer 39 (*)    GFR calc Af Amer 45 (*)    All other components within normal limits  URINALYSIS, ROUTINE W REFLEX MICROSCOPIC - Abnormal; Notable for the following components:   Leukocytes,Ua LARGE (*)    Bacteria, UA RARE (*)    All other components within normal limits  URINE CULTURE  CBC  HEMOGLOBIN A1C  TYPE AND SCREEN  ABO/RH     IMAGES:   EKG: 01/16/2018 Rate 86 bpm Normal sinus rhythm  Normal ECG  CV:  Past Medical History:  Diagnosis Date  . Anemia   . Cancer [facility])    ENDOMETRIAL CANCER  . Chronic kidney disease    ckd STAGE 3LOV DR Janae Sauce NEPHROLOGY 12-12-17  . Diabetes mellitus   . Diabetes mellitus type 2 in nonobese ([place]) 01/16/2018  . Essential hypertension 01/16/2018  . Exposure to hepatitis C    AS CHILD  . Exposure to TB 1973   AS CHILD  . Fatty  tumor    LEFT SHOULDER REMOVED  . Fibroids   . Hypertension    SLIGHT  . Uterine prolapse 01/16/2018    Past Surgical History:  Procedure Laterality Date  . CERVICAL CONING  74 OR 75  . DILATATION & CURETTAGE/HYSTEROSCOPY WITH MYOSURE N/A 01/30/2018   Procedure: DILATATION & CURETTAGE/HYSTEROSCOPY WITH MYOSURE;  Surgeon: Janyth Pupa, DO;  Location: [place];  Service: Gynecology;  Laterality: N/A;    MEDICATIONS: . Cholecalciferol (VITAMIN D) 50 MCG (2000 UT) tablet  . Flax Oil-Fish Oil-Borage Oil (FISH OIL-FLAX OIL-BORAGE OIL PO)  . lisinopril (PRINIVIL,ZESTRIL) 10 MG tablet  . metFORMIN (GLUCOPHAGE) 500 MG tablet  . Multiple Vitamins-Minerals (MULTIVITAMIN ADULT PO)   No current facility-administered  medications for this encounter.      Maia Plan [facility] Pre-Surgical Testing [phone] 03/12/18 9:16 AM

## 2018-03-13 LAB — URINE CULTURE: Culture: 40000 — AB

## 2018-03-13 NOTE — Progress Notes (Signed)
PCP: Leighton Ruff  CARDIOLOGIST: dr Oval Linsey   INFO IN Epic:cardiac clearance dr Oval Linsey 01-16-2018 epic, ekg 01-16-2018, labs done 03-12-2018; CBC, CMET, UA (ABNORMAL), URINE CULTURE (ABNORMAL), TYPE AND SCREEN, HEMAGLOBIN A1C. NEPHROLOGY LOV 12-03-17 [facility]  INFO ON CHART:  BLOOD THINNERS AND LAST DOSES: NONE ____________________________________  PATIENT SYMPTOMS AT TIME OF PREOP: NONE

## 2018-03-16 ENCOUNTER — Telehealth: Payer: Self-pay | Admitting: *Deleted

## 2018-03-16 ENCOUNTER — Telehealth: Payer: Self-pay | Admitting: Gynecologic Oncology

## 2018-03-16 ENCOUNTER — Encounter (HOSPITAL_COMMUNITY): Payer: Self-pay

## 2018-03-16 NOTE — Telephone Encounter (Signed)
Returned call to patient.  Patient stating she wanted to have a MOST form filled our prior to going into surgery in case she goes into kidney failure.  She states she has chronic kidney disease and she absolutely does not want to do on dialysis.Marland KitchenMarland Kitchen"I would rather die a natural death."  Advised her that the form would probably best be filled out with her and her PCP esp since she is focused around her kidney disease at the end of life.  Asked her several questions about her current health status including if she were to walk into the cancer center today and go into cardiac arrest would she want CPR and she stated "well I guess so."  Advised her that I would discuss what proper steps should be taken moving forward to ensure her wishes are taken into consideration with her surgery coming up with Dr. Denman George and contact her tomorrow.  All questions answered. She is advised to call for any needs.

## 2018-03-16 NOTE — Telephone Encounter (Signed)
Patient called and left a message that she would like to talk with either Dr. Denman George or a nurse regarding having a MOST order done before surgery. Message given to [facility] APP.

## 2018-03-17 ENCOUNTER — Telehealth: Payer: Self-pay | Admitting: *Deleted

## 2018-03-17 NOTE — Telephone Encounter (Signed)
Returned the patient's call and explained that once Dr. Denman George was back over in clinic [facility] APP will talk with her. [facility] APP will call the patient back after talking with Dr. Denman George

## 2018-03-19 ENCOUNTER — Ambulatory Visit (HOSPITAL_COMMUNITY): Payer: Medicare HMO | Admitting: Certified Registered Nurse Anesthetist

## 2018-03-19 ENCOUNTER — Ambulatory Visit (HOSPITAL_COMMUNITY): Payer: Medicare HMO | Admitting: Physician Assistant

## 2018-03-19 ENCOUNTER — Encounter (HOSPITAL_COMMUNITY)
Admission: AD | Disposition: A | Discharge status: Home / Self Care | Payer: Self-pay | Source: Home / Self Care | Attending: Gynecologic Oncology

## 2018-03-19 ENCOUNTER — Inpatient Hospital Stay (HOSPITAL_COMMUNITY)
Admission: AD | Admit: 2018-03-19 | Discharge: 2018-03-23 | DRG: 739 | Disposition: A | Discharge status: Home / Self Care | Payer: Medicare HMO | Attending: Urology | Admitting: Urology

## 2018-03-19 ENCOUNTER — Encounter (HOSPITAL_COMMUNITY): Payer: Self-pay | Admitting: Gynecologic Oncology

## 2018-03-19 ENCOUNTER — Other Ambulatory Visit: Payer: Self-pay

## 2018-03-19 DIAGNOSIS — D62 Acute posthemorrhagic anemia: Secondary | ICD-10-CM | POA: Diagnosis not present

## 2018-03-19 DIAGNOSIS — Z201 Contact with and (suspected) exposure to tuberculosis: Secondary | ICD-10-CM | POA: Diagnosis present

## 2018-03-19 DIAGNOSIS — Z886 Allergy status to analgesic agent status: Secondary | ICD-10-CM | POA: Diagnosis not present

## 2018-03-19 DIAGNOSIS — E1122 Type 2 diabetes mellitus with diabetic chronic kidney disease: Secondary | ICD-10-CM | POA: Diagnosis present

## 2018-03-19 DIAGNOSIS — C541 Malignant neoplasm of endometrium: Principal | ICD-10-CM

## 2018-03-19 DIAGNOSIS — F419 Anxiety disorder, unspecified: Secondary | ICD-10-CM | POA: Diagnosis present

## 2018-03-19 DIAGNOSIS — I129 Hypertensive chronic kidney disease with stage 1 through stage 4 chronic kidney disease, or unspecified chronic kidney disease: Secondary | ICD-10-CM | POA: Diagnosis present

## 2018-03-19 DIAGNOSIS — Z8249 Family history of ischemic heart disease and other diseases of the circulatory system: Secondary | ICD-10-CM | POA: Diagnosis not present

## 2018-03-19 DIAGNOSIS — Z87440 Personal history of urinary (tract) infections: Secondary | ICD-10-CM | POA: Diagnosis not present

## 2018-03-19 DIAGNOSIS — Z205 Contact with and (suspected) exposure to viral hepatitis: Secondary | ICD-10-CM | POA: Diagnosis present

## 2018-03-19 DIAGNOSIS — N814 Uterovaginal prolapse, unspecified: Secondary | ICD-10-CM | POA: Diagnosis present

## 2018-03-19 DIAGNOSIS — E871 Hypo-osmolality and hyponatremia: Secondary | ICD-10-CM | POA: Diagnosis not present

## 2018-03-19 DIAGNOSIS — I10 Essential (primary) hypertension: Secondary | ICD-10-CM | POA: Diagnosis not present

## 2018-03-19 DIAGNOSIS — R339 Retention of urine, unspecified: Secondary | ICD-10-CM | POA: Diagnosis not present

## 2018-03-19 DIAGNOSIS — G9341 Metabolic encephalopathy: Secondary | ICD-10-CM

## 2018-03-19 DIAGNOSIS — E119 Type 2 diabetes mellitus without complications: Secondary | ICD-10-CM

## 2018-03-19 DIAGNOSIS — IMO0001 Reserved for inherently not codable concepts without codable children: Secondary | ICD-10-CM

## 2018-03-19 DIAGNOSIS — N183 Chronic kidney disease, stage 3 (moderate): Secondary | ICD-10-CM | POA: Diagnosis present

## 2018-03-19 DIAGNOSIS — Z794 Long term (current) use of insulin: Secondary | ICD-10-CM

## 2018-03-19 DIAGNOSIS — Z79899 Other long term (current) drug therapy: Secondary | ICD-10-CM | POA: Diagnosis not present

## 2018-03-19 DIAGNOSIS — E611 Iron deficiency: Secondary | ICD-10-CM | POA: Diagnosis not present

## 2018-03-19 DIAGNOSIS — Z7984 Long term (current) use of oral hypoglycemic drugs: Secondary | ICD-10-CM

## 2018-03-19 DIAGNOSIS — R351 Nocturia: Secondary | ICD-10-CM | POA: Diagnosis present

## 2018-03-19 DIAGNOSIS — F329 Major depressive disorder, single episode, unspecified: Secondary | ICD-10-CM | POA: Diagnosis present

## 2018-03-19 DIAGNOSIS — E876 Hypokalemia: Secondary | ICD-10-CM | POA: Diagnosis not present

## 2018-03-19 DIAGNOSIS — N819 Female genital prolapse, unspecified: Secondary | ICD-10-CM | POA: Diagnosis not present

## 2018-03-19 DIAGNOSIS — N8189 Other female genital prolapse: Secondary | ICD-10-CM | POA: Diagnosis not present

## 2018-03-19 DIAGNOSIS — C542 Malignant neoplasm of myometrium: Secondary | ICD-10-CM | POA: Diagnosis not present

## 2018-03-19 HISTORY — PX: SENTINEL NODE BIOPSY: SHX6608

## 2018-03-19 HISTORY — PX: ROBOTIC ASSISTED TOTAL HYSTERECTOMY WITH BILATERAL SALPINGO OOPHERECTOMY: SHX6086

## 2018-03-19 HISTORY — PX: ROBOTIC ASSISTED LAPAROSCOPIC SACROCOLPOPEXY: SHX5388

## 2018-03-19 LAB — TYPE AND SCREEN
ABO/RH(D): O POS
Antibody Screen: NEGATIVE

## 2018-03-19 LAB — GLUCOSE, CAPILLARY
Glucose-Capillary: 237 mg/dL — ABNORMAL HIGH (ref 70–99)
Glucose-Capillary: 159 mg/dL — ABNORMAL HIGH (ref 70–99)
Glucose-Capillary: 210 mg/dL — ABNORMAL HIGH (ref 70–99)
Glucose-Capillary: 186 mg/dL — ABNORMAL HIGH (ref 70–99)
Glucose-Capillary: 184 mg/dL — ABNORMAL HIGH (ref 70–99)
Glucose-Capillary: 215 mg/dL — ABNORMAL HIGH (ref 70–99)

## 2018-03-19 SURGERY — HYSTERECTOMY, TOTAL, ROBOT-ASSISTED, LAPAROSCOPIC, WITH BILATERAL SALPINGO-OOPHORECTOMY
Anesthesia: General

## 2018-03-19 MED ORDER — CEFAZOLIN SODIUM-DEXTROSE 2-4 GM/100ML-% IV SOLN: 2.0000 g | INTRAVENOUS | Status: DC

## 2018-03-19 MED ORDER — ESTRADIOL 0.1 MG/GM VA CREA
TOPICAL_CREAM | VAGINAL | Status: DC | PRN
  Administered 2018-03-19: 1 via VAGINAL

## 2018-03-19 MED ORDER — INSULIN ASPART 100 UNIT/ML ~~LOC~~ SOLN
0.0000 [IU] | Freq: Every day | SUBCUTANEOUS | Status: DC
  Administered 2018-03-19: 2 [IU] via SUBCUTANEOUS

## 2018-03-19 MED ORDER — BUPIVACAINE HCL 0.25 % IJ SOLN
INTRAMUSCULAR | Status: DC | PRN
  Administered 2018-03-19: 17 mL

## 2018-03-19 MED ORDER — TRAMADOL HCL 50 MG PO TABS: 100.0000 mg | ORAL_TABLET | Freq: Two times a day (BID) | ORAL | Status: DC | PRN

## 2018-03-19 MED ORDER — INSULIN ASPART 100 UNIT/ML ~~LOC~~ SOLN
SUBCUTANEOUS | Status: AC
  Filled 2018-03-19: qty 1

## 2018-03-19 MED ORDER — MEPERIDINE HCL 50 MG/ML IJ SOLN: 6.2500 mg | INTRAMUSCULAR | Status: DC | PRN

## 2018-03-19 MED ORDER — FENTANYL CITRATE (PF) 100 MCG/2ML IJ SOLN
INTRAMUSCULAR | Status: DC | PRN
  Administered 2018-03-19 (×4): 50 ug via INTRAVENOUS

## 2018-03-19 MED ORDER — SUGAMMADEX SODIUM 500 MG/5ML IV SOLN
INTRAVENOUS | Status: DC | PRN
  Administered 2018-03-19: 250 mg via INTRAVENOUS

## 2018-03-19 MED ORDER — ONDANSETRON HCL 4 MG/2ML IJ SOLN
INTRAMUSCULAR | Status: DC | PRN
  Administered 2018-03-19 (×2): 4 mg via INTRAVENOUS

## 2018-03-19 MED ORDER — DEXMEDETOMIDINE HCL IN NACL 200 MCG/50ML IV SOLN
INTRAVENOUS | Status: AC
  Filled 2018-03-19: qty 50

## 2018-03-19 MED ORDER — SODIUM CHLORIDE (PF) 0.9 % IJ SOLN
INTRAMUSCULAR | Status: AC
  Filled 2018-03-19: qty 20

## 2018-03-19 MED ORDER — INSULIN ASPART 100 UNIT/ML ~~LOC~~ SOLN
0.0000 [IU] | Freq: Three times a day (TID) | SUBCUTANEOUS | Status: DC
  Administered 2018-03-19: 5 [IU] via SUBCUTANEOUS
  Administered 2018-03-20: 8 [IU] via SUBCUTANEOUS
  Administered 2018-03-20: 5 [IU] via SUBCUTANEOUS
  Administered 2018-03-21 (×2): 2 [IU] via SUBCUTANEOUS

## 2018-03-19 MED ORDER — HYDROMORPHONE HCL 1 MG/ML IJ SOLN: 0.5000 mg | INTRAMUSCULAR | Status: DC | PRN

## 2018-03-19 MED ORDER — ONDANSETRON HCL 4 MG/2ML IJ SOLN
INTRAMUSCULAR | Status: AC
  Filled 2018-03-19: qty 2

## 2018-03-19 MED ORDER — DEXAMETHASONE SODIUM PHOSPHATE 4 MG/ML IJ SOLN: 4.0000 mg | INTRAMUSCULAR | Status: DC

## 2018-03-19 MED ORDER — LACTATED RINGERS IR SOLN
Status: DC | PRN
  Administered 2018-03-19: 1000 mL

## 2018-03-19 MED ORDER — MIDAZOLAM HCL 5 MG/5ML IJ SOLN
INTRAMUSCULAR | Status: DC | PRN
  Administered 2018-03-19: 2 mg via INTRAVENOUS

## 2018-03-19 MED ORDER — STERILE WATER FOR INJECTION IJ SOLN
INTRAMUSCULAR | Status: AC
  Filled 2018-03-19: qty 10

## 2018-03-19 MED ORDER — ACETAMINOPHEN 500 MG PO TABS
1000.0000 mg | ORAL_TABLET | ORAL | Status: AC
  Administered 2018-03-19: 1000 mg via ORAL
  Filled 2018-03-19: qty 2

## 2018-03-19 MED ORDER — DEXAMETHASONE SODIUM PHOSPHATE 10 MG/ML IJ SOLN
INTRAMUSCULAR | Status: DC | PRN
  Administered 2018-03-19: 5 mg via INTRAVENOUS

## 2018-03-19 MED ORDER — INSULIN GLARGINE 100 UNIT/ML ~~LOC~~ SOLN
12.0000 [IU] | Freq: Every day | SUBCUTANEOUS | Status: DC
  Administered 2018-03-19 – 2018-03-22 (×4): 12 [IU] via SUBCUTANEOUS
  Filled 2018-03-19 (×4): qty 0.12

## 2018-03-19 MED ORDER — SCOPOLAMINE 1 MG/3DAYS TD PT72
1.0000 | MEDICATED_PATCH | TRANSDERMAL | Status: DC
  Administered 2018-03-19: 1.5 mg via TRANSDERMAL
  Filled 2018-03-19: qty 1

## 2018-03-19 MED ORDER — PROPOFOL 10 MG/ML IV BOLUS
INTRAVENOUS | Status: DC | PRN
  Administered 2018-03-19: 150 mg via INTRAVENOUS

## 2018-03-19 MED ORDER — CEFAZOLIN SODIUM-DEXTROSE 2-4 GM/100ML-% IV SOLN
2.0000 g | INTRAVENOUS | Status: AC
  Administered 2018-03-19: 2 g via INTRAVENOUS
  Filled 2018-03-19: qty 100

## 2018-03-19 MED ORDER — DEXAMETHASONE SODIUM PHOSPHATE 10 MG/ML IJ SOLN
INTRAMUSCULAR | Status: AC
  Filled 2018-03-19: qty 1

## 2018-03-19 MED ORDER — ROCURONIUM BROMIDE 100 MG/10ML IV SOLN
INTRAVENOUS | Status: AC
  Filled 2018-03-19: qty 1

## 2018-03-19 MED ORDER — SENNOSIDES-DOCUSATE SODIUM 8.6-50 MG PO TABS
2.0000 | ORAL_TABLET | Freq: Every day | ORAL | Status: DC
  Administered 2018-03-19 – 2018-03-20 (×2): 2 via ORAL
  Filled 2018-03-19 (×4): qty 2

## 2018-03-19 MED ORDER — LIDOCAINE 2% (20 MG/ML) 5 ML SYRINGE
INTRAMUSCULAR | Status: DC | PRN
  Administered 2018-03-19: 50 mg via INTRAVENOUS

## 2018-03-19 MED ORDER — LIDOCAINE 2% (20 MG/ML) 5 ML SYRINGE
INTRAMUSCULAR | Status: AC
  Filled 2018-03-19: qty 5

## 2018-03-19 MED ORDER — OXYCODONE HCL 5 MG PO TABS: 5.0000 mg | ORAL_TABLET | ORAL | Status: DC | PRN

## 2018-03-19 MED ORDER — ENOXAPARIN SODIUM 40 MG/0.4ML ~~LOC~~ SOLN
40.0000 mg | SUBCUTANEOUS | Status: AC
  Administered 2018-03-19: 40 mg via SUBCUTANEOUS
  Filled 2018-03-19: qty 0.4

## 2018-03-19 MED ORDER — FENTANYL CITRATE (PF) 100 MCG/2ML IJ SOLN
INTRAMUSCULAR | Status: AC
  Filled 2018-03-19: qty 2

## 2018-03-19 MED ORDER — LACTATED RINGERS IV SOLN
INTRAVENOUS | Status: DC
  Administered 2018-03-19 (×2): via INTRAVENOUS

## 2018-03-19 MED ORDER — ROCURONIUM BROMIDE 50 MG/5ML IV SOSY
PREFILLED_SYRINGE | INTRAVENOUS | Status: DC | PRN
  Administered 2018-03-19: 20 mg via INTRAVENOUS
  Administered 2018-03-19: 60 mg via INTRAVENOUS
  Administered 2018-03-19: 20 mg via INTRAVENOUS

## 2018-03-19 MED ORDER — MIDAZOLAM HCL 2 MG/2ML IJ SOLN
INTRAMUSCULAR | Status: AC
  Filled 2018-03-19: qty 2

## 2018-03-19 MED ORDER — BUPIVACAINE LIPOSOME 1.3 % IJ SUSP
20.0000 mL | Freq: Once | INTRAMUSCULAR | Status: AC
  Administered 2018-03-19: 20 mL
  Filled 2018-03-19: qty 20

## 2018-03-19 MED ORDER — PHENYLEPHRINE 40 MCG/ML (10ML) SYRINGE FOR IV PUSH (FOR BLOOD PRESSURE SUPPORT)
PREFILLED_SYRINGE | INTRAVENOUS | Status: DC | PRN
  Administered 2018-03-19 (×4): 80 ug via INTRAVENOUS

## 2018-03-19 MED ORDER — LISINOPRIL 10 MG PO TABS
10.0000 mg | ORAL_TABLET | Freq: Every day | ORAL | Status: DC
  Administered 2018-03-20 – 2018-03-22 (×3): 10 mg via ORAL
  Filled 2018-03-19 (×3): qty 1

## 2018-03-19 MED ORDER — LACTATED RINGERS IV SOLN
INTRAVENOUS | Status: DC
  Administered 2018-03-19: 10:00:00 via INTRAVENOUS

## 2018-03-19 MED ORDER — KCL IN DEXTROSE-NACL 20-5-0.45 MEQ/L-%-% IV SOLN
INTRAVENOUS | Status: DC
  Administered 2018-03-19 – 2018-03-20 (×2): via INTRAVENOUS
  Filled 2018-03-19 (×2): qty 1000

## 2018-03-19 MED ORDER — ENOXAPARIN SODIUM 30 MG/0.3ML ~~LOC~~ SOLN
30.0000 mg | SUBCUTANEOUS | Status: DC
  Administered 2018-03-20 – 2018-03-22 (×3): 30 mg via SUBCUTANEOUS
  Filled 2018-03-19 (×3): qty 0.3

## 2018-03-19 MED ORDER — HYDROMORPHONE HCL 1 MG/ML IJ SOLN: 0.2500 mg | INTRAMUSCULAR | Status: DC | PRN

## 2018-03-19 MED ORDER — PROMETHAZINE HCL 25 MG/ML IJ SOLN: 6.2500 mg | INTRAMUSCULAR | Status: DC | PRN

## 2018-03-19 MED ORDER — PROPOFOL 10 MG/ML IV BOLUS
INTRAVENOUS | Status: AC
  Filled 2018-03-19: qty 20

## 2018-03-19 MED ORDER — STERILE WATER FOR IRRIGATION IR SOLN
Status: DC | PRN
  Administered 2018-03-19: 1000 mL

## 2018-03-19 MED ORDER — ESTRADIOL 0.1 MG/GM VA CREA
TOPICAL_CREAM | VAGINAL | Status: AC
  Filled 2018-03-19: qty 42.5

## 2018-03-19 MED ORDER — GABAPENTIN 300 MG PO CAPS
300.0000 mg | ORAL_CAPSULE | ORAL | Status: AC
  Administered 2018-03-19: 300 mg via ORAL
  Filled 2018-03-19: qty 1

## 2018-03-19 MED ORDER — ONDANSETRON HCL 4 MG PO TABS: 4.0000 mg | ORAL_TABLET | Freq: Four times a day (QID) | ORAL | Status: DC | PRN

## 2018-03-19 MED ORDER — BUPIVACAINE HCL (PF) 0.25 % IJ SOLN
INTRAMUSCULAR | Status: AC
  Filled 2018-03-19: qty 30

## 2018-03-19 MED ORDER — SUGAMMADEX SODIUM 500 MG/5ML IV SOLN
INTRAVENOUS | Status: AC
  Filled 2018-03-19: qty 5

## 2018-03-19 MED ORDER — ONDANSETRON HCL 4 MG/2ML IJ SOLN
4.0000 mg | Freq: Four times a day (QID) | INTRAMUSCULAR | Status: DC | PRN
  Administered 2018-03-19 – 2018-03-20 (×3): 4 mg via INTRAVENOUS
  Filled 2018-03-19 (×3): qty 2

## 2018-03-19 MED ORDER — PHENYLEPHRINE HCL 10 MG/ML IJ SOLN
INTRAMUSCULAR | Status: AC
  Filled 2018-03-19: qty 1

## 2018-03-19 MED ORDER — ACETAMINOPHEN 10 MG/ML IV SOLN
1000.0000 mg | Freq: Four times a day (QID) | INTRAVENOUS | Status: AC
  Administered 2018-03-19 – 2018-03-20 (×3): 1000 mg via INTRAVENOUS
  Filled 2018-03-19 (×3): qty 100

## 2018-03-19 MED ORDER — STERILE WATER FOR INJECTION IJ SOLN
INTRAMUSCULAR | Status: DC | PRN
  Administered 2018-03-19: 10 mL

## 2018-03-19 MED ORDER — DEXMEDETOMIDINE HCL 200 MCG/2ML IV SOLN
INTRAVENOUS | Status: DC | PRN
  Administered 2018-03-19 (×2): 8 ug via INTRAVENOUS

## 2018-03-19 SURGICAL SUPPLY — 80 items
ADH SKN CLS APL DERMABOND .7 (GAUZE/BANDAGES/DRESSINGS) ×2
AGENT HMST KT MTR STRL THRMB (HEMOSTASIS)
APL ESCP 34 STRL LF DISP (HEMOSTASIS)
APPLICATOR SURGIFLO ENDO (HEMOSTASIS) IMPLANT
BAG LAPAROSCOPIC 12 15 PORT 16 (BASKET) IMPLANT
BAG RETRIEVAL 12/15 (BASKET)
BAG SPEC RTRVL LRG 6X4 10 (ENDOMECHANICALS)
BAG URINE DRAINAGE (UROLOGICAL SUPPLIES) ×1 IMPLANT
CATH FOLEY 3WAY 30CC 20FR (CATHETERS) ×1 IMPLANT
CHLORAPREP W/TINT 26ML (MISCELLANEOUS) ×3 IMPLANT
CLIP VESOLOCK LG 6/CT PURPLE (CLIP) ×3 IMPLANT
CLIP VESOLOCK MED LG 6/CT (CLIP) IMPLANT
COVER BACK TABLE 60X90IN (DRAPES) ×3 IMPLANT
COVER SURGICAL LIGHT HANDLE (MISCELLANEOUS) ×3 IMPLANT
COVER TIP SHEARS 8 DVNC (MISCELLANEOUS) ×4 IMPLANT
COVER TIP SHEARS 8MM DA VINCI (MISCELLANEOUS) ×1
COVER WAND RF STERILE (DRAPES) IMPLANT
DECANTER SPIKE VIAL GLASS SM (MISCELLANEOUS) ×2 IMPLANT
DERMABOND ADVANCED (GAUZE/BANDAGES/DRESSINGS) ×1
DERMABOND ADVANCED .7 DNX12 (GAUZE/BANDAGES/DRESSINGS) ×4 IMPLANT
DRAPE ARM DVNC X/XI (DISPOSABLE) ×16 IMPLANT
DRAPE COLUMN DVNC XI (DISPOSABLE) ×4 IMPLANT
DRAPE DA VINCI XI ARM (DISPOSABLE) ×4
DRAPE DA VINCI XI COLUMN (DISPOSABLE) ×1
DRAPE SHEET LG 3/4 BI-LAMINATE (DRAPES) ×7 IMPLANT
DRAPE SURG IRRIG POUCH 19X23 (DRAPES) ×5 IMPLANT
ELECT PENCIL ROCKER SW 15FT (MISCELLANEOUS) ×3 IMPLANT
ELECT REM PT RETURN 15FT ADLT (MISCELLANEOUS) ×5 IMPLANT
GLOVE BIO SURGEON STRL SZ 6 (GLOVE) ×12 IMPLANT
GLOVE BIO SURGEON STRL SZ 6.5 (GLOVE) ×4 IMPLANT
GLOVE BIOGEL M STRL SZ7.5 (GLOVE) ×9 IMPLANT
GOWN STRL REUS W/ TWL LRG LVL3 (GOWN DISPOSABLE) ×4 IMPLANT
GOWN STRL REUS W/ TWL XL LVL3 (GOWN DISPOSABLE) ×4 IMPLANT
GOWN STRL REUS W/TWL LRG LVL3 (GOWN DISPOSABLE) ×10 IMPLANT
GOWN STRL REUS W/TWL XL LVL3 (GOWN DISPOSABLE) ×6
HOLDER FOLEY CATH W/STRAP (MISCELLANEOUS) ×5 IMPLANT
IRRIG SUCT STRYKERFLOW 2 WTIP (MISCELLANEOUS) ×3
IRRIGATION SUCT STRKRFLW 2 WTP (MISCELLANEOUS) ×4 IMPLANT
KIT BASIN OR (CUSTOM PROCEDURE TRAY) ×2 IMPLANT
KIT PROCEDURE DA VINCI SI (MISCELLANEOUS) ×1
KIT PROCEDURE DVNC SI (MISCELLANEOUS) IMPLANT
MANIPULATOR UTERINE 4.5 ZUMI (MISCELLANEOUS) ×3 IMPLANT
MARKER SKIN DUAL TIP RULER LAB (MISCELLANEOUS) ×2 IMPLANT
MESH Y UPSYLON VAGINAL (Mesh General) ×1 IMPLANT
NDL SPNL 18GX3.5 QUINCKE PK (NEEDLE) IMPLANT
NEEDLE HYPO 22GX1.5 SAFETY (NEEDLE) ×1 IMPLANT
NEEDLE SPNL 18GX3.5 QUINCKE PK (NEEDLE) ×3 IMPLANT
OBTURATOR OPTICAL STANDARD 8MM (TROCAR) ×1
OBTURATOR OPTICAL STND 8 DVNC (TROCAR) ×2
OBTURATOR OPTICALSTD 8 DVNC (TROCAR) ×2 IMPLANT
OCCLUDER COLPOPNEUMO (BALLOONS) IMPLANT
PACK ROBOT GYN CUSTOM WL (TRAY / TRAY PROCEDURE) ×3 IMPLANT
PACKING VAGINAL (PACKING) ×1 IMPLANT
PAD POSITIONING PINK XL (MISCELLANEOUS) ×5 IMPLANT
PLUG CATH AND CAP STER (CATHETERS) ×3 IMPLANT
PORT ACCESS TROCAR AIRSEAL 12 (TROCAR) ×4 IMPLANT
PORT ACCESS TROCAR AIRSEAL 5M (TROCAR) ×1
POUCH SPECIMEN RETRIEVAL 10MM (ENDOMECHANICALS) IMPLANT
SEAL CANN UNIV 5-8 DVNC XI (MISCELLANEOUS) ×16 IMPLANT
SEAL XI 5MM-8MM UNIVERSAL (MISCELLANEOUS) ×4
SET TRI-LUMEN FLTR TB AIRSEAL (TUBING) ×5 IMPLANT
SOLUTION ELECTROLUBE (MISCELLANEOUS) ×3 IMPLANT
SURGIFLO W/THROMBIN 8M KIT (HEMOSTASIS) IMPLANT
SUT PROLENE 2 0 CT 1 (SUTURE) ×3 IMPLANT
SUT VIC AB 0 CT1 27 (SUTURE)
SUT VIC AB 0 CT1 27XBRD ANTBC (SUTURE) ×2 IMPLANT
SUT VIC AB 2-0 SH 27 (SUTURE) ×15
SUT VIC AB 2-0 SH 27XBRD (SUTURE) ×10 IMPLANT
SUT VIC AB 3-0 SH 27 (SUTURE) ×6
SUT VIC AB 3-0 SH 27X BRD (SUTURE) IMPLANT
SUT VICRYL 0 UR6 27IN ABS (SUTURE) ×4 IMPLANT
SYR 10ML LL (SYRINGE) IMPLANT
SYR 50ML LL SCALE MARK (SYRINGE) IMPLANT
SYR BULB IRRIGATION 50ML (SYRINGE) IMPLANT
TOWEL OR NON WOVEN STRL DISP B (DISPOSABLE) ×3 IMPLANT
TRAP SPECIMEN MUCOUS 40CC (MISCELLANEOUS) IMPLANT
TRAY LAPAROSCOPIC (CUSTOM PROCEDURE TRAY) ×2 IMPLANT
TROCAR XCEL 12X100 BLDLESS (ENDOMECHANICALS) IMPLANT
UNDERPAD 30X30 (UNDERPADS AND DIAPERS) ×3 IMPLANT
WATER STERILE IRR 1000ML POUR (IV SOLUTION) ×7 IMPLANT

## 2018-03-19 NOTE — Op Note (Signed)
OPERATIVE NOTE 03/19/18  Surgeon: Donaciano Eva   Assistants: Dr Lahoma Crocker (an MD assistant was necessary for tissue manipulation, management of robotic instrumentation, retraction and positioning due to the complexity of the case and hospital policies).   Anesthesia: General endotracheal anesthesia  ASA Class: 3   Pre-operative Diagnosis: endometrial cancer grade 1  Post-operative Diagnosis: same,   Operation: Robotic-assisted laparoscopic total hysterectomy with bilateral salpingoophorectomy, SLN biopsy   Surgeon: Donaciano Eva  Assistant Surgeon: Lahoma Crocker MD  Anesthesia: GET  Urine Output: 100  Operative Findings:  : stage IV prolapse, normal appearing cervix, posterior uterine fibroid, hematometrium, normal ovaries and tubes, no suspicious nodes.  Estimated Blood Loss:  <20      Total IV Fluids: 700 ml         Specimens: uterus, cervix, bilateral tubes and ovaries, right and left external iliac SLN, left common iliac SLN         Complications:  None; patient tolerated the procedure well.         Disposition: PACU - hemodynamically stable.  Procedure Details  The patient was seen in the Holding Room. The risks, benefits, complications, treatment options, and expected outcomes were discussed with the patient.  The patient concurred with the proposed plan, giving informed consent.  The site of surgery properly noted/marked. The patient was identified as Jamie Macdonald and the procedure verified as a Robotic-assisted hysterectomy with bilateral salpingo oophorectomy with SLN biopsy. A Time Out was held and the above information confirmed.  After induction of anesthesia, the patient was draped and prepped in the usual sterile manner. Pt was placed in supine position after anesthesia and draped and prepped in the usual sterile manner. The abdominal drape was placed after the CholoraPrep had been allowed to dry for 3 minutes.  Her arms were tucked to  her side with all appropriate precautions.  The shoulders were stabilized with padded shoulder blocks applied to the acromium processes.  The patient was placed in the semi-lithotomy position in Jamie Macdonald.  The perineum was prepped with Betadine. The patient was then prepped. Foley catheter was placed.  A sterile speculum was placed in the vagina.  The cervix was grasped with a single-tooth tenaculum. 2mg  total of ICG was injected into the cervical stroma at 2 and 9 o'clock with 1cc injected at a 1cm and 39mm depth (concentration 0.5mg /ml) in all locations. The cervix was dilated with Jamie Macdonald dilators.  The Jamie Macdonald uterine manipulator with a medium colpotomizer ring was placed without difficulty.  A pneum occluder balloon was placed over the manipulator.  OG tube placement was confirmed and to suction.   Next, a 5 mm skin incision was made 1 cm below the subcostal margin in the midclavicular line.  The 5 mm Optiview port and scope was used for direct entry.  Opening pressure was under 10 mm CO2.  The abdomen was insufflated and the findings were noted as above.   At this point and all points during the procedure, the patient's intra-abdominal pressure did not exceed 15 mmHg. Next, a 10 mm skin incision was made in the umbilicus and a right and left port was placed about 10 cm lateral to the robot port on the right and left side.  A fourth arm was placed in the left lower quadrant 2 cm above and superior and medial to the anterior superior iliac spine.  All ports were placed under direct visualization.  The patient was placed in steep Trendelenburg.  Bowel  was folded away into the upper abdomen.  The robot was docked in the normal manner.  The right and left peritoneum were opened parallel to the IP ligament to open the retroperitoneal spaces bilaterally. The SLN mapping was performed in bilateral pelvic basins. The para rectal and paravesical spaces were opened up entirely with careful dissection below the level  of the ureters bilaterally and to the depth of the uterine artery origin in order to skeletonize the uterine "web" and ensure visualization of all parametrial channels. The para-aortic basins were carefully exposed and evaluated for isolated para-aortic SLN's. Lymphatic channels were identified travelling to the following visualized sentinel lymph node's: right external iliac SLN (this node was very small raising the question of whether this was channel versus node, left external iliac SLN, left common iliac (lateral) SLN. These SLN's were separated from their surrounding lymphatic tissue, removed and sent for permanent pathology.  The hysterectomy was started after the round ligament on the right side was incised and the retroperitoneum was entered and the pararectal space was developed.  The ureter was noted to be on the medial leaf of the broad ligament.  The peritoneum above the ureter was incised and stretched and the infundibulopelvic ligament was skeletonized, cauterized and cut.  The posterior peritoneum was taken down to the level of the KOH ring.  The anterior peritoneum was also taken down.  The bladder flap was created to the level of the KOH ring.  The uterine artery on the right side was skeletonized, cauterized and cut in the normal manner.  A similar procedure was performed on the left.  The colpotomy was made and the uterus, cervix, bilateral ovaries and tubes were amputated and delivered through the vagina.  Pedicles were inspected and excellent hemostasis was achieved.    The colpotomy at the vaginal cuff was closed with Vicryl on a CT1 needle in a running manner.  Irrigation was used and excellent hemostasis was achieved.  At this point my procedure was completed.  Dr Louis Meckel proceeded with robotic assisted sacrocolpopexy. The balloon was removed from the vagina.   Donaciano Eva, MD

## 2018-03-19 NOTE — Anesthesia Postprocedure Evaluation (Signed)
Anesthesia Post Note  Patient: Jamie Macdonald  Procedure(s) Performed: XI ROBOTIC ASSISTED TOTAL HYSTERECTOMY WITH BILATERAL SALPINGO OOPHORECTOMY (Bilateral ) SENTINEL NODE BIOPSY (Bilateral ) XI ROBOTIC ASSISTED LAPAROSCOPIC SACROCOLPOPEXY (N/A )     Patient location during evaluation: PACU Anesthesia Type: General Level of consciousness: awake and alert and oriented Pain management: pain level controlled Vital Signs Assessment: post-procedure vital signs reviewed and stable Respiratory status: spontaneous breathing, nonlabored ventilation and respiratory function stable Cardiovascular status: blood pressure returned to baseline and stable Postop Assessment: no apparent nausea or vomiting Anesthetic complications: no    Last Vitals:  Vitals:   03/19/18 1847 03/19/18 2002  BP: 139/76 (!) 143/70  Pulse: 68 74  Resp: 17 16  Temp: (!) 36.4 C (!) 36.4 C  SpO2: 100% 100%    Last Pain:  Vitals:   03/19/18 2002  TempSrc: Oral  PainSc:                  [name] A.

## 2018-03-19 NOTE — H&P (Signed)
Consult Note: Gyn-Onc  Consult was requested by Dr. Nelda Marseille for the evaluation of Jamie Macdonald 73 y.o. female  CC:  Endometrial cancer. Uterine prolapse  Assessment/Plan:  Jamie. DAVITA Macdonald  is a 73 y.o.  year old with FIGO grade 1 endometrioid endometrial cancer and severe symptomatic pelvic organ prolapse. She desires both to be surgically managed at the same time, understanding that this may result in a potential for delay in scheduling surgery know to coordinate procedures.  A detailed discussion was held with the patient and her family with regard to to her endometrial cancer diagnosis. We discussed the standard management options for uterine cancer which includes surgery followed possibly by adjuvant therapy depending on the results of surgery. The options for surgical management include a hysterectomy and removal of the tubes and ovaries possibly with removal of pelvic and para-aortic lymph nodes.If feasible, a minimally invasive approach including a robotic hysterectomy or laparoscopic hysterectomy have benefits including shorter hospital stay, recovery time and better wound healing than with open surgery. The patient has been counseled about these surgical options and the risks of surgery in general including infection, bleeding, damage to surrounding structures (including bowel, bladder, ureters, nerves or vessels), and the postoperative risks of PE/ DVT, and lymphedema. I extensively reviewed the additional risks of robotic hysterectomy including possible need for conversion to open laparotomy.  I discussed positioning during surgery of trendelenberg and risks of minor facial swelling and care we take in preoperative positioning.  After counseling and consideration of her options, she desires to proceed with robotic assisted total hysterectomy with bilateral sapingo-oophorectomy and SLN biopsy.   She will be seen by anesthesia for preoperative clearance and discussion of postoperative pain  management.  She was given the opportunity to ask questions, which were answered to her satisfaction, and she is agreement with the above mentioned plan of care.  HPI: Jamie Macdonald is a 73 year old woman who is seen in consultation at the request of Dr Nelda Marseille for grade 1 endometrial cancer.   The patient reports a history of occasional bleeding symptoms since the beginning of November 2019.  She was on estradiol while using her pessary which had been placed in 2019 for severe pelvic organ prolapse.  She was evaluated by Dr. Nelda Marseille who performed a D&C on January 30, 2018.  This revealed well differentiated endometrial adenocarcinoma with foci approaching moderate differentiation.  The patient has a history of severe pelvic organ prolapse that is symptomatic.  She saw Dr. [place] Pink in May 2018 who prescribed vaginal estrogen.  She then was seen back later in the year and recommended hysterectomy with a tacking procedure.  The patient was concerned about the use of vaginal mesh and therefore declined at this time.  She subsequently saw Dr. Baruch Goldmann who placed a vaginal pessary which does help with her symptoms of prolapse however the patient does not like having to wear a pessary and desires definitive surgical management.  Her medical history is significant for very well controlled type 2 diabetes mellitus.  She regular checks her blood glucose levels and takes oral agent (metformin) but no insulin for this.  Her last hemoglobin A1c was less than 6%.  She also has hypertension and some anxiety and depression.  Her surgical history is significant for a surgical cone and a tubal ligation but no other major abdominal surgeries.  Her family history is unremarkable for malignancy  Current Meds:  Outpatient Encounter Medications as of 02/13/2018  Medication Sig  .  Cholecalciferol (VITAMIN D) 50 MCG (2000 UT) tablet Take 2,000 Units by mouth daily.  . Flax Oil-Fish Oil-Borage Oil (FISH OIL-FLAX OIL-BORAGE OIL PO)  Take by mouth daily.   Marland Kitchen lisinopril (PRINIVIL,ZESTRIL) 10 MG tablet Take 10 mg by mouth daily.  . metFORMIN (GLUCOPHAGE) 500 MG tablet Take 1 tablet by mouth 2 (two) times daily.  . Multiple Vitamins-Minerals (MULTIVITAMIN ADULT PO) Take by mouth daily.    No facility-administered encounter medications on file as of 02/13/2018.     Allergy:  Allergies  Allergen Reactions  . Nsaids Other (See Comments)    Due to chronic kidney disease pt does not take any celebrex, motrin, aleve etc.    Social Hx:   Social History   Socioeconomic History  . Marital status: Divorced    Spouse name: Not on file  . Number of children: Not on file  . Years of education: Not on file  . Highest education level: Not on file  Occupational History  . Not on file  Social Needs  . Financial resource strain: Not on file  . Food insecurity:    Worry: Not on file    Inability: Not on file  . Transportation needs:    Medical: Not on file    Non-medical: Not on file  Tobacco Use  . Smoking status: Never Smoker  . Smokeless tobacco: Never Used  Substance and Sexual Activity  . Alcohol use: No  . Drug use: Never  . Sexual activity: Not on file  Lifestyle  . Physical activity:    Days per week: Not on file    Minutes per session: Not on file  . Stress: Not on file  Relationships  . Social connections:    Talks on phone: Not on file    Gets together: Not on file    Attends religious service: Not on file    Active member of club or organization: Not on file    Attends meetings of clubs or organizations: Not on file    Relationship status: Not on file  . Intimate partner violence:    Fear of current or ex partner: Not on file    Emotionally abused: Not on file    Physically abused: Not on file    Forced sexual activity: Not on file  Other Topics Concern  . Not on file  Social History Narrative  . Not on file    Past Surgical Hx:  Past Surgical History:  Procedure Laterality Date  .  CERVICAL CONING  74 OR 75  . DILATATION & CURETTAGE/HYSTEROSCOPY WITH MYOSURE N/A 01/30/2018   Procedure: DILATATION & CURETTAGE/HYSTEROSCOPY WITH MYOSURE;  Surgeon: Janyth Pupa, DO;  Location: [facility];  Service: Gynecology;  Laterality: N/A;    Past Medical Hx:  Past Medical History:  Diagnosis Date  . Anemia   . Cancer [facility])    ENDOMETRIAL CANCER  . Chronic kidney disease    ckd STAGE 3LOV DR Janae Sauce NEPHROLOGY 12-12-17  . Diabetes mellitus   . Diabetes mellitus type 2 in nonobese ([place]) 01/16/2018  . Essential hypertension 01/16/2018  . Exposure to hepatitis C    AS CHILD  . Exposure to TB 1973   AS CHILD  . Fatty tumor    LEFT SHOULDER REMOVED  . Fibroids   . Hypertension    SLIGHT  . Uterine prolapse 01/16/2018  . UTI (urinary tract infection)    STARTED AMOXICILLIAN FRIDAY 03-13-2018    Past Gynecological History:  See HPI No  LMP recorded. Patient is postmenopausal.  Family Hx:  Family History  Problem Relation Age of Onset  . Stroke Mother   . Parkinson's disease Mother   . Heart disease Father        HEART ATTACK  . Diabetes Father   . Prostate cancer Father   . Diabetes Sister   . Cancer Sister     Review of Systems:  Constitutional  Feels well,    ENT Normal appearing ears and nares bilaterally Skin/Breast  No rash, sores, jaundice, itching, dryness Cardiovascular  No chest pain, shortness of breath, or edema  Pulmonary  No cough or wheeze.  Gastro Intestinal  No nausea, vomitting, or diarrhoea. No bright red blood per rectum, no abdominal pain, change in bowel movement, or constipation.  Genito Urinary  No frequency, urgency, dysuria, + bleeding, + prolapse Musculo Skeletal  No myalgia, arthralgia, joint swelling or pain  Neurologic  No weakness, numbness, change in gait,  Psychology  No depression, anxiety, insomnia.   Vitals:  There were no vitals taken for this visit.  Physical Exam: WD in NAD Neck  Supple NROM, without  any enlargements.  Lymph Node Survey No cervical supraclavicular or inguinal adenopathy Cardiovascular  Pulse normal rate, regularity and rhythm. S1 and S2 normal.  Lungs  Clear to auscultation bilateraly, without wheezes/crackles/rhonchi. Good air movement.  Skin  No rash/lesions/breakdown  Psychiatry  Alert and oriented to person, place, and time  Abdomen  Normoactive bowel sounds, abdomen soft, non-tender and not obese without evidence of hernia.  Back No CVA tenderness Genito Urinary  Vulva/vagina: Normal external female genitalia.   No lesions. No discharge or bleeding.  Bladder/urethra:  No lesions or masses, prolapsed bladder  Vagina: (pessary removed for exam, then replaced) + prolapse, no lesions  Cervix: Normal appearing, no lesions.  Uterus:  Small, mobile, no parametrial involvement or nodularity.  Adnexa: no palpable masses. Rectal  deferred Extremities  No bilateral cyanosis, clubbing or edema.   Thereasa Solo, MD  03/19/2018, 9:15 AM

## 2018-03-19 NOTE — Transfer of Care (Signed)
Immediate Anesthesia Transfer of Care Note  Patient: Jamie Macdonald  Procedure(s) Performed: XI ROBOTIC ASSISTED TOTAL HYSTERECTOMY WITH BILATERAL SALPINGO OOPHORECTOMY (Bilateral ) SENTINEL NODE BIOPSY (Bilateral ) XI ROBOTIC ASSISTED LAPAROSCOPIC SACROCOLPOPEXY (N/A )  Patient Location: PACU  Anesthesia Type:General  Level of Consciousness: drowsy and patient cooperative  Airway & Oxygen Therapy: Patient Spontanous Breathing and Patient connected to face mask oxygen  Post-op Assessment: Report given to RN and Post -op Vital signs reviewed and stable  Post vital signs: Reviewed and stable  Last Vitals:  Vitals Value Taken Time  BP 148/73 03/19/2018  3:30 PM  Temp    Pulse 55 03/19/2018  3:34 PM  Resp 15 03/19/2018  3:34 PM  SpO2 100 % 03/19/2018  3:34 PM  Vitals shown include unvalidated device data.  Last Pain:  Vitals:   03/19/18 0920  TempSrc: Oral         Complications: No apparent anesthesia complications

## 2018-03-19 NOTE — H&P (Signed)
Pelvic organ prolapse  HPI: Jamie Macdonald is a 73 year-old female established patient who is here for further evaluation of her pelvic prolapse.  Referred by Dr. Denman George. Patient states she noticed a "bulging" in her vagina several years ago while picking up logs. Over the past 5-6 months she noticed worsening bulging. Prior to the patient wearing a pessary she was not having any significant urinary urge incontinence or stress incontinence. She felt as if she had a strong stream and empties her bladder completely. She did not have to strain to void. She did not require any posturing or splinting to void. She was having normal bowel movements as well. She is now wearing a pessary which she has been wearing for the past for 5 months, and tolerating it well.   Most recent surgery with Dr. Nelda Marseille of [facility] 01/30/2018 biopsy of uterus showing differentiated endometrial adencarcinoma with foci approaching moderate differentiation. Patient had been having occasional bleeding beginning 11/2017. She was prescribed vaginal estrogen by Dr. Zigmund Daniel 05/2016.  11/2017 creatinine 1.63; GFR 31.   03/02/18: Patient here today for urodynamic results. The patient has had no significant worsening of her lower urinary tract symptoms that she was last here. We replaced her pessary at the last visit and she states that it seems to be in a good spot if she is voiding fine. She does not strain to void and feels if she has a good strong stream.   The urodynamics demonstrated no evidence of stress urinary incontinence. There is no overactive bladder. She did have what appears to be possibly some ureteral reflux with some bladder trabeculation which would suggest high-pressure voiding. This may be result of the pessary, and does not appear to be clinically significant. She did have some mild elevation in her PVR. Again, this could be from her pessary.   The patient's past medical history is significant for chronic kidney insufficiency as well  as diabetes.. She has never had any intra-abdominal surgeries.   03/12/18:  This patient has been routinely wearing a pessary for approximately 1 month. Today, she states that it has become improperly positioned and uncomfortable since her last office visit on 03/02/18, when it was removed for UDS testing. She is no longer able to apply vaginal estrogen cream secondary to recent diagnosis of endometrial cancer. Today she complains of vaginal burning. She denies dysuria. She is scheduled for pelvic reconstructive surgery on 03/19/2018, and she prefers to continue with her pessary until then as this is more comfortable for her.   She first noticed her pelvic prolapse approximately 02/15/1999. She does have pelvic pain. Her symptoms have been worse over the last year.   She does not have an abnormal sensation when she needs to urinate. She is urinating more frequently now than usual. She does have a good size and strength to her urinary stream. She is having problems with emptying her bladder well.   She is having problems with urinary control or incontinence. She does not wear protective pads. The patient does not wear pads. She can get to the bathroom in time when she gets the urge to urinate. She does not leak urine when she coughs, laughs, sneezes or bears down. The patient does have a history of recurrent UTIs.   The patient does not posture when she voids or defecates. The patient does not have trouble with constipation.   The patient has no history of pelvic floor surgery - she maintains her uterus.     ALLERGIES:  No Allergies    MEDICATIONS: Lisinopril 10 mg tablet  Metformin Hcl 500 mg tablet  Fish Oil  Flax Oil 1,000 mg capsule  Mineral Rich  Multivitamin  Silica 4098 Mcg  Vitamin C  Vitamin D3     GU PSH: Complex cystometrogram, w/ void pressure and urethral pressure profile studies, any technique - 03/02/2018 Complex Uroflow - 03/02/2018 Emg surf Electrd - 03/02/2018 Inject For  cystogram - 03/02/2018 Intrabd voidng Press - 03/02/2018      PSH Notes: Cervical coning (1970s), D&C-biopsy of uterus (01/2018)   NON-GU PSH: No Non-GU PSH    GU PMH: Female genital prolapse, unspecified - 03/02/2018, - 02/19/2018 Incomplete bladder emptying - 03/02/2018 Nocturia - 03/02/2018      PMH Notes: uterine cancer   NON-GU PMH: Diabetes Type 2 Hypertension    FAMILY HISTORY: Death - Mother, Father Heart Attack - Father Hypertension - Mother Parkinson's Disease - Mother Prostate Cancer - Father stroke - Mother Tuberculosis - Aunt, Uncle   SOCIAL HISTORY: Marital Status: Divorced Preferred Language: English; Ethnicity: Not Hispanic Or Latino; Race: White Current Smoking Status: Patient has never smoked.   Tobacco Use Assessment Completed: Used Tobacco in last 30 days? Has never drank.  Does not drink caffeine. Patient's occupation is/was Retired.    REVIEW OF SYSTEMS:    GU Review Female:   Patient denies frequent urination, hard to postpone urination, burning /pain with urination, get up at night to urinate, leakage of urine, stream starts and stops, trouble starting your stream, have to strain to urinate, and being pregnant.  Gastrointestinal (Upper):   Patient denies indigestion/ heartburn, nausea, and vomiting.  Gastrointestinal (Lower):   Patient denies diarrhea and constipation.  Constitutional:   Patient denies fever, night sweats, weight loss, and fatigue.  Skin:   Patient denies skin rash/ lesion and itching.  Eyes:   Patient denies blurred vision and double vision.  Ears/ Nose/ Throat:   Patient denies sore throat and sinus problems.  Hematologic/Lymphatic:   Patient denies swollen glands and easy bruising.  Cardiovascular:   Patient denies leg swelling and chest pains.  Respiratory:   Patient denies cough and shortness of breath.  Endocrine:   Patient denies excessive thirst.  Musculoskeletal:   Patient denies back pain and joint pain.  Neurological:    Patient denies headaches and dizziness.  Psychologic:   Patient denies depression and anxiety.   VITAL SIGNS:      03/12/2018 09:51 AM  BP 157/87 mmHg  Pulse 104 /min  Temperature 97.5 F / 36.3 C   GU PHYSICAL EXAMINATION:    External Genitalia: No hirsutism, no rash, no scarring, no cyst, no erythematous lesion, no papular lesion, no blanched lesion, no warty lesion. No edema.  Urethral Meatus: Normal size. Normal position. No discharge.  Vagina: Mild vaginal atrophy. No stenosis. No rectocele. No cystocele. No enterocele. Well lubricated vaginal mucosa. No erythema, inflammation or ulcerations noted. No bleeding present. 2 3/4' ring with incontinence knob not in proper position.   MULTI-SYSTEM PHYSICAL EXAMINATION:    Constitutional: Well-nourished. No physical deformities. Normally developed. Good grooming.  Neck: Neck symmetrical, not swollen. Normal tracheal position.  Respiratory: No labored breathing, no use of accessory muscles.   Neurologic / Psychiatric: Oriented to time, oriented to place, oriented to person. No depression, no anxiety, no agitation.  Gastrointestinal: No mass, no tenderness, no rigidity, non obese abdomen.  Musculoskeletal: Normal gait and station of head and neck.     PAST DATA REVIEWED:  Source  Of History:  Patient  Urine Test Review:   Urinalysis   PROCEDURES: None   ASSESSMENT:      ICD-10 Details  1 GU:   Female genital prolapse, unspecified - N81.9   2   Incomplete bladder emptying - R39.14   3   Nocturia - R35.1    PLAN:           Schedule Return Visit/Planned Activity: Keep Scheduled Appointment          Document Letter(s):  Created for Patient: Clinical Summary         Notes:   This patient's pessary was not in proper position. She was experiencing vaginal burning and discomfort. Pelvic exam did not indicate any signs of vaginal infection, discharge, bleeding, or ulcerations. I uneventfully removed her pessary, cleaned it, and properly  reinserted it. She is now much more comfortable. I recommended she continue routine application of coconut oil, as she has been tolerating this well. I also recommended the option for her to try [place] or [employer] aloe vera lubricant. Urinalysis from yesterday is not consistent with a urinary tract infection. Urine culture results are pending. I plan to treat accordingly when her results are final. Will continue with her previously scheduled surgery under the care of Dr. Louis Meckel on 03/19/18.

## 2018-03-19 NOTE — Anesthesia Preprocedure Evaluation (Signed)
Anesthesia Evaluation  Patient identified by MRN, date of birth, ID band Patient awake    Reviewed: Allergy & Precautions, NPO status , Patient's Chart, lab work & pertinent test results  Airway Mallampati: II  TM Distance: >3 FB Neck ROM: Full    Dental  (+) Teeth Intact, Dental Advisory Given   Pulmonary neg pulmonary ROS,    breath sounds clear to auscultation       Cardiovascular hypertension, Pt. on medications  Rhythm:Regular Rate:Normal     Neuro/Psych negative neurological ROS     GI/Hepatic negative GI ROS, Neg liver ROS,   Endo/Other  diabetes, Type 2, Oral Hypoglycemic Agents  Renal/GU Renal InsufficiencyRenal disease     Musculoskeletal negative musculoskeletal ROS (+)   Abdominal Normal abdominal exam  (+)   Peds  Hematology negative hematology ROS (+) anemia ,   Anesthesia Other Findings   Reproductive/Obstetrics                            Anesthesia Physical  Anesthesia Plan  ASA: II  Anesthesia Plan: General   Post-op Pain Management:    Induction: Intravenous  PONV Risk Score and Plan: 4 or greater and Ondansetron, Dexamethasone, Midazolam and Scopolamine patch - Pre-op  Airway Management Planned: Oral ETT  Additional Equipment: None  Intra-op Plan:   Post-operative Plan: Extubation in OR  Informed Consent: I have reviewed the patients History and Physical, chart, labs and discussed the procedure including the risks, benefits and alternatives for the proposed anesthesia with the patient or authorized representative who has indicated his/her understanding and acceptance.     Dental advisory given  Plan Discussed with: CRNA  Anesthesia Plan Comments:         Anesthesia Quick Evaluation

## 2018-03-19 NOTE — Progress Notes (Signed)
MD was notified that patient had vomited three times coffee ground vomitus and urine is bloody. Patient is lethargic and has been un able to get up and ambulate, MD notified. Zofran IV every 6 hours PRN was given at 1939. Verbal order to give another dose of Zofran IV.

## 2018-03-19 NOTE — Op Note (Signed)
Preoperative diagnosis:  1. Pelvic organ prolapse  2. Uterine cancer he was brought in  Postoperative diagnosis:  1. same   Procedure: 1. Robotic assisted laparoscopic sacrocolpopexy   Surgeon: Ardis Hughs, MD 1st assistant: Basilio Cairo, MD Anesthesia: General  Complications: None  Intraoperative findings: Pringle Y-Mesh placed, cut to specific vaginal length  EBL: 50cc  Specimens: None  Indication: Jamie Macdonald is a 73 y.o. female patient with symptomatic pelvic organ prolapse.  After reviewing the management options for treatment, he elected to proceed with the above surgical procedure(s). We have discussed the potential benefits and risks of the procedure, side effects of the proposed treatment, the likelihood of the patient achieving the goals of the procedure, and any potential problems that might occur during the procedure or recuperation. Informed consent has been obtained.  Description of procedure:  Access was gained in the robot ports placed the robot subsequently docked by Dr. Denman George.  Please see her dictation for more details in this regard.  Once Dr. Denman George had completed the patient's radical hysterectomy I assumed the console and performed the following surgery.  I continued the posterior dissection started by Dr. Denman George by retracting down on the rectum and finding the avascular plane between the posterior vaginal wall and the rectum. I carried this dissection down as far as I could to along the area of the perineal body.  I then turned my attention to the anterior plane between the anterior vaginal wall and the bladder. I was able to obtain access to the avascular plane and with a combination of both monopolar cautery and blunt dissection was able to clean and nice down to the bladder neck.  Once this was completed I started dissecting at the sacral promontory located 3 cm medial to the location where the ureter crosses over the iliac vessels  at the pelvic brim. The posterior peritoneum was incised and the sacral prominence cleared off an area taking care to avoid the middle sacral vessels and the iliac branches. I then dissected along the retroperitoneum trial just under the posterior peritoneal plane..  This basically just extending the dissection that Dr. Denman George had begun.  Mesh was measured at approximately 7cm anteriorly and 7 cm posteriorly and I cut this on the back table. The mesh was then placed into the patient's abdomen through the assistant port and the anterior leaf was secured down onto the anterior vaginal wall with the apex at the bladder neck. The posterior leaf was then secured down on the posterior vaginal wall. These were sewn down with 2-0 Vicryl. Between 6 and 8 were done on each side. At this point I then went back to the previously dissected sacral promontory and posterior peritoneal tunnel and inserted a instrument through the tunnel and grasped the end of the mesh at the vaginal cuff and pull it up to the sacrum. I then checked to ensure that the sacral mesh was not too tight by performing a vaginal exam. I then secured the sacral leg of the mesh using a 0 Prolene. I then reapproximated the posterior peritoneum with a 2-0 Vicryl in a running fashion around the sacral promontory. The pelvic peritoneum was closed using a pursestring. The fascia of the 12 mm port was then closed with 0 Vicryl in a figure-of-eight fashion. The skin was closed with 4-0 Monocryl's. Dermabond was applied to the incision and exparel injected into the incisions.  The patient was subsequently extubated and returned to the PACU in excellent  condition.   Ardis Hughs, M.D.

## 2018-03-19 NOTE — Anesthesia Procedure Notes (Signed)
Procedure Name: Intubation Date/Time: 03/19/2018 11:21 AM Performed by: West Pugh, CRNA Pre-anesthesia Checklist: Patient identified, Emergency Drugs available, Suction available, Patient being monitored and Timeout performed Patient Re-evaluated:Patient Re-evaluated prior to induction Oxygen Delivery Method: Circle system utilized Preoxygenation: Pre-oxygenation with 100% oxygen Induction Type: IV induction Ventilation: Mask ventilation without difficulty Laryngoscope Size: Mac and 3 Grade View: Grade II Tube type: Oral Tube size: 7.0 mm Number of attempts: 1 Airway Equipment and Method: Stylet Placement Confirmation: ETT inserted through vocal cords under direct vision,  positive ETCO2,  CO2 detector and breath sounds checked- equal and bilateral Secured at: 21 cm Tube secured with: Tape Dental Injury: Teeth and Oropharynx as per pre-operative assessment

## 2018-03-20 ENCOUNTER — Encounter (HOSPITAL_COMMUNITY): Payer: Self-pay | Admitting: Gynecologic Oncology

## 2018-03-20 LAB — CBC
HCT: 27.1 % — ABNORMAL LOW (ref 36.0–46.0)
Hemoglobin: 9.2 g/dL — ABNORMAL LOW (ref 12.0–15.0)
MCH: 28.6 pg (ref 26.0–34.0)
MCHC: 33.9 g/dL (ref 30.0–36.0)
MCV: 84.2 fL (ref 80.0–100.0)
Platelets: 211 10*3/uL (ref 150–400)
RBC: 3.22 MIL/uL — ABNORMAL LOW (ref 3.87–5.11)
RDW: 12.8 % (ref 11.5–15.5)
WBC: 11.3 10*3/uL — ABNORMAL HIGH (ref 4.0–10.5)
nRBC: 0 % (ref 0.0–0.2)

## 2018-03-20 LAB — OSMOLALITY: Osmolality: 256 mOsm/kg — ABNORMAL LOW (ref 275–295)

## 2018-03-20 LAB — BASIC METABOLIC PANEL
Anion gap: 8 (ref 5–15)
Anion gap: 10 (ref 5–15)
BUN: 15 mg/dL (ref 8–23)
BUN: 17 mg/dL (ref 8–23)
CHLORIDE: 88 mmol/L — AB (ref 98–111)
CO2: 22 mmol/L (ref 22–32)
CO2: 20 mmol/L — AB (ref 22–32)
Calcium: 8.6 mg/dL — ABNORMAL LOW (ref 8.9–10.3)
Calcium: 8.7 mg/dL — ABNORMAL LOW (ref 8.9–10.3)
Chloride: 92 mmol/L — ABNORMAL LOW (ref 98–111)
Creatinine, Ser: 1.4 mg/dL — ABNORMAL HIGH (ref 0.44–1.00)
Creatinine, Ser: 1.58 mg/dL — ABNORMAL HIGH (ref 0.44–1.00)
GFR calc Af Amer: 43 mL/min — ABNORMAL LOW (ref >60)
GFR calc Af Amer: 37 mL/min — ABNORMAL LOW (ref >60)
GFR calc non Af Amer: 37 mL/min — ABNORMAL LOW (ref >60)
GFR calc non Af Amer: 32 mL/min — ABNORMAL LOW (ref >60)
GLUCOSE: 236 mg/dL — AB (ref 70–99)
Glucose, Bld: 183 mg/dL — ABNORMAL HIGH (ref 70–99)
POTASSIUM: 4.7 mmol/L (ref 3.5–5.1)
Potassium: 4.7 mmol/L (ref 3.5–5.1)
Sodium: 122 mmol/L — ABNORMAL LOW (ref 135–145)
Sodium: 118 mmol/L — CL (ref 135–145)

## 2018-03-20 LAB — OSMOLALITY, URINE: Osmolality, Ur: 240 mOsm/kg — ABNORMAL LOW (ref 300–900)

## 2018-03-20 LAB — MRSA PCR SCREENING: MRSA by PCR: NEGATIVE

## 2018-03-20 LAB — GLUCOSE, CAPILLARY
GLUCOSE-CAPILLARY: 106 mg/dL — AB (ref 70–99)
Glucose-Capillary: 258 mg/dL — ABNORMAL HIGH (ref 70–99)
Glucose-Capillary: 215 mg/dL — ABNORMAL HIGH (ref 70–99)
Glucose-Capillary: 132 mg/dL — ABNORMAL HIGH (ref 70–99)

## 2018-03-20 LAB — SODIUM, URINE, RANDOM: Sodium, Ur: <10 mmol/L

## 2018-03-20 LAB — SODIUM
SODIUM: 122 mmol/L — AB (ref 135–145)
Sodium: 119 mmol/L — CL (ref 135–145)
Sodium: 122 mmol/L — ABNORMAL LOW (ref 135–145)

## 2018-03-20 MED ORDER — CHLORHEXIDINE GLUCONATE CLOTH 2 % EX PADS
6.0000 | MEDICATED_PAD | Freq: Every day | CUTANEOUS | Status: DC
  Administered 2018-03-20 – 2018-03-22 (×3): 6 via TOPICAL

## 2018-03-20 MED ORDER — TRAMADOL HCL 50 MG PO TABS: 50.0000 mg | ORAL_TABLET | Freq: Four times a day (QID) | ORAL | 0 refills | Status: DC | PRN

## 2018-03-20 MED ORDER — SCOPOLAMINE 1 MG/3DAYS TD PT72
1.0000 | MEDICATED_PATCH | TRANSDERMAL | Status: DC
  Filled 2018-03-20: qty 1

## 2018-03-20 MED ORDER — ESTRADIOL 0.1 MG/GM VA CREA: TOPICAL_CREAM | VAGINAL | 1 refills | Status: DC

## 2018-03-20 MED ORDER — SODIUM CHLORIDE 0.9 % IV SOLN
INTRAVENOUS | Status: DC
  Administered 2018-03-20: 12:00:00 via INTRAVENOUS

## 2018-03-20 MED ORDER — SENNOSIDES-DOCUSATE SODIUM 8.6-50 MG PO TABS: 2.0000 | ORAL_TABLET | Freq: Every day | ORAL | 1 refills | Status: DC

## 2018-03-20 MED ORDER — SODIUM CHLORIDE 3 % IV SOLN
INTRAVENOUS | Status: AC
  Administered 2018-03-20: 50 mL/h via INTRAVENOUS
  Filled 2018-03-20 (×3): qty 500

## 2018-03-20 NOTE — Progress Notes (Signed)
Urology Inpatient Progress Report  ENDOMETRIAL CANCER, UTERINE PROLAPSE  Procedure(s): XI ROBOTIC ASSISTED TOTAL HYSTERECTOMY WITH BILATERAL SALPINGO OOPHORECTOMY SENTINEL NODE BIOPSY XI ROBOTIC ASSISTED LAPAROSCOPIC SACROCOLPOPEXY  1 Day Post-Op   Intv/Subj: Emesis overnight, with some persistant nausea this AM.  Seems to be holding clears down.  Minimal pain.   Foley and packing out.  Principal Problem:   Endometrial cancer [facility]) Active Problems:   Uterine prolapse  Current Facility-Administered Medications  Medication Dose Route Frequency Provider Last Rate Last Dose  . 0.9 %  sodium chloride infusion   Intravenous Continuous Ardis Hughs, MD      . acetaminophen (OFIRMEV) IV 1,000 mg  1,000 mg Intravenous Q6H Ardis Hughs, MD 400 mL/hr at 03/20/18 0537 1,000 mg at 03/20/18 0537  . enoxaparin (LOVENOX) injection 30 mg  30 mg Subcutaneous Q24H Lahoma Crocker, MD   30 mg at 03/20/18 1610  . HYDROmorphone (DILAUDID) injection 0.5 mg  0.5 mg Intravenous Q4H PRN Lahoma Crocker, MD      . insulin aspart (novoLOG) injection 0-15 Units  0-15 Units Subcutaneous TID WC Lahoma Crocker, MD   8 Units at 03/20/18 [phone]  . insulin aspart (novoLOG) injection 0-5 Units  0-5 Units Subcutaneous QHS Lahoma Crocker, MD   2 Units at 03/19/18 2256  . insulin glargine (LANTUS) injection 12 Units  12 Units Subcutaneous QHS Lahoma Crocker, MD   12 Units at 03/19/18 2256  . lisinopril (PRINIVIL,ZESTRIL) tablet 10 mg  10 mg Oral Daily Lahoma Crocker, MD      . ondansetron [facility]) tablet 4 mg  4 mg Oral Q6H PRN Lahoma Crocker, MD       Or  . ondansetron [facility]) injection 4 mg  4 mg Intravenous Q6H PRN Lahoma Crocker, MD   4 mg at 03/20/18 0705  . [START ON 03/22/2018] scopolamine (TRANSDERM-SCOP) 1 MG/3DAYS 1.5 mg  1 patch Transdermal Q72H Ardis Hughs, MD      . senna-docusate (Senokot-S) tablet 2 tablet  2 tablet Oral QHS Lahoma Crocker, MD    2 tablet at 03/19/18 2255  . traMADol (ULTRAM) tablet 100 mg  100 mg Oral Q12H PRN Lahoma Crocker, MD         Objective: Vital: Vitals:   03/19/18 2055 03/19/18 2306 03/20/18 0147 03/20/18 0518  BP: (!) 148/77 (!) 148/78 113/66 117/72  Pulse: 75 92 84 (!) 102  Resp: 16 16 16 16   Temp: 97.6 F (36.4 C) 98.6 F (37 C) 97.8 F (36.6 C) 99.3 F (37.4 C)  TempSrc: Oral Oral Oral Oral  SpO2: 100% 97% 97% 97%  Weight:       I/Os: I/O last 3 completed shifts: In: 3584.9 [P.O.:240; I.V.:3044.9; IV Piggyback:300] Out: 1975 [Urine:1325; Emesis/NG output:550; Blood:100]  Physical Exam:  General: Patient is in no apparent distress Lungs: Normal respiratory effort, chest expands symmetrically. GI: Incisions are c/d/i. Foley out Ext: lower extremities symmetric  Lab Results: Recent Labs    03/20/18 0440  WBC 11.3*  HGB 9.2*  HCT 27.1*   Recent Labs    03/20/18 0440  NA 122*  K 4.7  CL 92*  CO2 22  GLUCOSE 236*  BUN 15  CREATININE 1.40*  CALCIUM 8.6*   No results for input(s): LABPT, INR in the last 72 hours. No results for input(s): LABURIN in the last 72 hours. Results for orders placed or performed during the hospital encounter of 03/12/18  Urine culture     Status: Abnormal   Collection Time: 03/12/18  8:49 AM  Result Value Ref Range Status   Specimen Description   Final    URINE, CLEAN CATCH Performed at [facility], [place] [address], [place], [place] [address]    Special Requests   Final    NONE Performed at [facility], [place] [address] [place], [place] [address]    Culture (A)  Final    40,000 COLONIES/mL GROUP B STREP(S.AGALACTIAE)ISOLATED TESTING AGAINST S. AGALACTIAE NOT ROUTINELY PERFORMED DUE TO PREDICTABILITY OF AMP/PEN/VAN SUSCEPTIBILITY. Performed at [facility], [place] [address], [place], [place] [address]    Report Status 03/13/2018 FINAL  Final    Studies/Results: No results  found.  Assessment: Procedure(s): XI ROBOTIC ASSISTED TOTAL HYSTERECTOMY WITH BILATERAL SALPINGO OOPHORECTOMY SENTINEL NODE BIOPSY XI ROBOTIC ASSISTED LAPAROSCOPIC SACROCOLPOPEXY, 1 Day Post-Op  doing well. Hyponatremia. Post-op delirium/confusion   Plan: Switch to NS, repeat BMP in am ADAT HLIVF this PM if tolerating diet Minimize narcotics Encourage ambulation  Likely home in tomorrow AM  Louis Meckel, MD Urology 03/20/2018, 8:57 AM

## 2018-03-20 NOTE — Progress Notes (Signed)
1 Day Post-Op Procedure(s) (LRB): XI ROBOTIC ASSISTED TOTAL HYSTERECTOMY WITH BILATERAL SALPINGO OOPHORECTOMY (Bilateral) SENTINEL NODE BIOPSY (Bilateral) XI ROBOTIC ASSISTED LAPAROSCOPIC SACROCOLPOPEXY (N/A)  Subjective: Patient reports minimal abdominal pain this am.  Emesis last pm. No nausea reported at this time but some reported earlier this am.  Passing flatus. Denies chest pain, dyspnea. Daughters at the bedside. Just ordered breakfast for the patient. No concerns voiced at this time.      Objective: Vital signs in last 24 hours: Temp:  [95.3 F (35.2 C)-99.3 F (37.4 C)] 99.3 F (37.4 C) (03/06 0518) Pulse Rate:  [53-102] 102 (03/06 0518) Resp:  [7-19] 16 (03/06 0518) BP: (113-153)/(66-83) 117/72 (03/06 0518) SpO2:  [95 %-100 %] 97 % (03/06 0518) Weight:  [140 lb 3.4 oz (63.6 kg)] 140 lb 3.4 oz (63.6 kg) (03/05 1258) Last BM Date: 03/18/18  Intake/Output from previous day: 03/05 0701 - 03/06 0700 In: 3584.9 [P.O.:240; I.V.:3044.9; IV Piggyback:300] Out: 1975 [Urine:1325; Emesis/NG output:550; Blood:100]  Physical Examination: General: alert, cooperative and no distress Resp: clear to auscultation bilaterally Cardio: regular rate and rhythm, S1, S2 normal, no murmur, click, rub or gallop GI: soft, non-tender; bowel sounds normal; no masses,  no organomegaly and incision: lap sites to the abdomen with dermabond, mild ecchymosis present around left abdominal incision but no active drainage Extremities: mild pedal edma bilaterally, non-pitting Making disoriented moments intermittently mixing up days, unable to remember pharmacy  Labs: WBC/Hgb/Hct/Plts:  11.3/9.2/27.1/211 (03/06 0440) BUN/Cr/glu/ALT/AST/amyl/lip:  15/1.40/--/--/--/--/-- (03/06 0440)  Assessment: 73 y.o. s/p Procedure(s): XI ROBOTIC ASSISTED TOTAL HYSTERECTOMY WITH BILATERAL SALPINGO OOPHORECTOMY SENTINEL NODE BIOPSY XI ROBOTIC ASSISTED LAPAROSCOPIC SACROCOLPOPEXY: stable Pain:  Pain is well-controlled on  PRN medications.  Neuro: Post-op confusion. Pain medication adjusted.  Heme: Hgb 9.2 and Hct 27.1 this am. Pre-op Hgb 13.2 and Hct 39.9. Plan for repeat labs in the am.  ID:  WBC 11.3 this am. Decadron administered in the OR. Ancef given in OR.   CV: BP and HR stable.  Mildly tachycardic at times.  Continue to monitor with vital sign assessments.  GI:  Tolerating po: Yes. Scopolamine patch in place.  Pt to try regular food this am.   GU: Due to void since foley removal.  Adequate output reported.  Creatinine 1.40 this am-hx of chronic kidney disease.    FEN: No critical values- Na+ 122- fluids changed to NS per Dr. Louis Meckel.  Endo: Diabetes mellitus Type II, under fair control.  CBG:  CBG (last 3)  Recent Labs    03/19/18 1818 03/19/18 2244 03/20/18 0751  GLUCAP 184* 215* 258*   Prophylaxis: SCDs and lovenox 30 mg SQ Q24H.   Plan: IVF change to NS per Dr. Louis Meckel AM Labs Discontinue oxycodone Encourage ambulation, IS use, deep breathing, and coughing Diet as tolerated Plan for probable discharge in the am Continue plan of care per Dr. Denman George and Dr. Louis Meckel   LOS: 0 days    Jamie Macdonald 03/20/2018, 9:37 AM

## 2018-03-20 NOTE — Plan of Care (Signed)
Patient lying in bed this morning; no complaints of pain at this time. Patient confused this morning. Will continue to monitor.

## 2018-03-20 NOTE — Progress Notes (Signed)
Notified of critical sodium value (118). Patient's family notified nursing staff of increasing confusion.  BMET    Component Value Date/Time   NA 118 (LL) 03/20/2018 1346   K 4.7 03/20/2018 1346   CL 88 (L) 03/20/2018 1346   CO2 20 (L) 03/20/2018 1346   GLUCOSE 183 (H) 03/20/2018 1346   BUN 17 03/20/2018 1346   CREATININE 1.58 (H) 03/20/2018 1346   CALCIUM 8.7 (L) 03/20/2018 1346   GFRNONAA 32 (L) 03/20/2018 1346   GFRAA 37 (L) 03/20/2018 1346   CBC    Component Value Date/Time   WBC 11.3 (H) 03/20/2018 0440   RBC 3.22 (L) 03/20/2018 0440   HGB 9.2 (L) 03/20/2018 0440   HCT 27.1 (L) 03/20/2018 0440   PLT 211 03/20/2018 0440   MCV 84.2 03/20/2018 0440   MCH 28.6 03/20/2018 0440   MCHC 33.9 03/20/2018 0440   RDW 12.8 03/20/2018 0440     Confusion and altered mental status likely secondary to acute hyponatremia (possible post-op SIADH). Given that it is symptomatic and at "severe" level (<120), will admit to ICU and start hypertonic saline infusion in accordance with protocol (goal to raise Na 4-22meq  Per 24 hours). Frequent rechecks of Na+ and freqent neuro checks per protocol.  Thereasa Solo, MD

## 2018-03-20 NOTE — Progress Notes (Signed)
CRITICAL VALUE ALERT  Critical Value: Na 118  Date & Time Notied:  1440  Provider Notified: [id number]  Orders Received/Actions taken: Pending, NP notified and she is alerting Dr. Denman George

## 2018-03-20 NOTE — Plan of Care (Signed)
Patient in bed this morning; states she feels like she needs to get up and try to go to bathroom. Sat EOB. Stand and try to ambulate; patient unsteady on feet. Retrieve BSC instead of ambulating to bathroom. Patient unable to void at this time, but acting slightly confused, which night shift RN states has been her norm since awakening at 0400. Patient redirects easily and is not agitated or combative. Patient back to bed safely. Bed alarm on. Will continue to monitor.

## 2018-03-20 NOTE — Progress Notes (Signed)
Per order, Dr. Denman George made aware of follow-up sodium level. Sodium now 122. Per MD, hold sodium chloride 3% solution but continue monitoring sodium levels every 2 hours.  Night shift RN aware.

## 2018-03-20 NOTE — Progress Notes (Signed)
Patient in the bathroom voiding with assistance.  Ambulated back to the chair without difficulty but reported feeling slightly unsteady at times earlier but states she feels it is improving.  Assisted to the chair.  Minimal abdominal discomfort reported.  Tolerated regular food at breakfast and lunch with no nausea reported.  Denies dizziness, lightheadedness.  Forgetfulness, mild confusion remains similar to am assessment.  Daughter feels that is may be slightly worse.  Discussed recent lab work.  Discussed the need to be transferred to the ICU for close monitoring and to receive a hypertonic saline solution with repeat lab work in efforts to correct the hyponatremia. Close neuro checks to be performed. Given a cup a chicken broth.  Advised to limit plain water intake.  Patient states prior to surgery she was trying to take in no salt and was drinking close to 84 ounces of water a day due to her chronic kidney disease.  All questions answered.  Daughter at the bedside. No concerns voiced at the end of visit.

## 2018-03-20 NOTE — Discharge Instructions (Addendum)
Urology Louis Meckel):  Activity:  You are encouraged to ambulate frequently (about every hour during waking hours) to help prevent blood clots from forming in your legs or lungs.  However, you should not engage in any heavy lifting (> 10-15 lbs), strenuous activity, or straining.   Diet: You should advance your diet as instructed by your physician.  It will be normal to have some bloating, nausea, and abdominal discomfort intermittently.   Prescriptions:  You will be provided a prescription for pain medication to take as needed.  If your pain is not severe enough to require the prescription pain medication, you may take extra strength Tylenol instead which will have less side effects.  You should also take a prescribed stool softener to avoid straining with bowel movements as the prescription pain medication may constipate you.   Incisions: You may remove your dressing bandages 48 hours after surgery if not removed in the hospital.  You will either have some small staples or special tissue glue at each of the incision sites. Once the bandages are removed (if present), the incisions may stay open to air.  You may start showering (but not soaking or bathing in water) the 2nd day after surgery and the incisions simply need to be patted dry after the shower.  No additional care is needed.   What to call us about: You should call the office [phone]) if you develop fever > 101 or develop persistent vomiting. Activity:  You are encouraged to ambulate frequently (about every hour during waking hours) to help prevent blood clots from forming in your legs or lungs.  However, you should not engage in any heavy lifting (> 10-15 lbs), strenuous activity, or straining.   GYN/ONC Denman George) Return to work: 4 weeks  Activity: 1. Be up and out of the bed during the day.  Take a nap if needed.  You may walk up steps but be careful and use the hand rail.  Stair climbing will tire you more than you think, you may  need to stop part way and rest.   2. No lifting or straining for 6 weeks.  3. No driving for 1 weeks.  Do Not drive if you are taking narcotic pain medicine.  4. Shower daily.  Use soap and water on your incision and pat dry; don't rub.   5. No sexual activity and nothing in the vagina for 8 weeks.  Medications:  - Take tylenol first line for pain control. Take these regularly (every 6 hours) to decrease the build up of pain.  - If necessary, for severe pain not relieved by tylenol, take tramadol.  - While taking oxycodone you should take sennakot every night to reduce the likelihood of constipation. If this causes diarrhea, stop its use.  Diet: 1. Low sodium Heart Healthy Diet is recommended.  2. It is safe to use a laxative if you have difficulty moving your bowels.   Wound Care: 1. Keep clean and dry.  Shower daily.  Reasons to call the Doctor:   Fever - Oral temperature greater than 100.4 degrees Fahrenheit  Foul-smelling vaginal discharge  Difficulty urinating  Nausea and vomiting  Increased pain at the site of the incision that is unrelieved with pain medicine.  Difficulty breathing with or without chest pain  New calf pain especially if only on one side  Sudden, continuing increased vaginal bleeding with or without clots.   Follow-up: 1. See Everitt Amber in 3 weeks.  Contacts: For questions or concerns you  should contact:  Dr. Everitt Amber at [phone] After hours and on week-ends call [phone] and ask to speak to the physician on call for Gynecologic Oncology

## 2018-03-21 DIAGNOSIS — G9341 Metabolic encephalopathy: Secondary | ICD-10-CM

## 2018-03-21 DIAGNOSIS — E871 Hypo-osmolality and hyponatremia: Secondary | ICD-10-CM

## 2018-03-21 LAB — BASIC METABOLIC PANEL
Anion gap: 10 (ref 5–15)
Anion gap: 6 (ref 5–15)
BUN: 17 mg/dL (ref 8–23)
BUN: 15 mg/dL (ref 8–23)
CALCIUM: 8.4 mg/dL — AB (ref 8.9–10.3)
CO2: 19 mmol/L — ABNORMAL LOW (ref 22–32)
CO2: 23 mmol/L (ref 22–32)
Calcium: 8.5 mg/dL — ABNORMAL LOW (ref 8.9–10.3)
Chloride: 93 mmol/L — ABNORMAL LOW (ref 98–111)
Chloride: 100 mmol/L (ref 98–111)
Creatinine, Ser: 1.33 mg/dL — ABNORMAL HIGH (ref 0.44–1.00)
Creatinine, Ser: 1.34 mg/dL — ABNORMAL HIGH (ref 0.44–1.00)
GFR calc Af Amer: 46 mL/min — ABNORMAL LOW (ref >60)
GFR calc Af Amer: 46 mL/min — ABNORMAL LOW (ref >60)
GFR calc non Af Amer: 40 mL/min — ABNORMAL LOW (ref >60)
GFR calc non Af Amer: 39 mL/min — ABNORMAL LOW (ref >60)
GLUCOSE: 157 mg/dL — AB (ref 70–99)
Glucose, Bld: 134 mg/dL — ABNORMAL HIGH (ref 70–99)
POTASSIUM: 3.8 mmol/L (ref 3.5–5.1)
Potassium: 3.9 mmol/L (ref 3.5–5.1)
Sodium: 122 mmol/L — ABNORMAL LOW (ref 135–145)
Sodium: 129 mmol/L — ABNORMAL LOW (ref 135–145)

## 2018-03-21 LAB — CBC
HEMATOCRIT: 23.2 % — AB (ref 36.0–46.0)
Hemoglobin: 7.7 g/dL — ABNORMAL LOW (ref 12.0–15.0)
MCH: 28.3 pg (ref 26.0–34.0)
MCHC: 33.2 g/dL (ref 30.0–36.0)
MCV: 85.3 fL (ref 80.0–100.0)
Platelets: 145 10*3/uL — ABNORMAL LOW (ref 150–400)
RBC: 2.72 MIL/uL — ABNORMAL LOW (ref 3.87–5.11)
RDW: 13.2 % (ref 11.5–15.5)
WBC: 10.5 10*3/uL (ref 4.0–10.5)
nRBC: 0 % (ref 0.0–0.2)

## 2018-03-21 LAB — GLUCOSE, CAPILLARY
Glucose-Capillary: 135 mg/dL — ABNORMAL HIGH (ref 70–99)
Glucose-Capillary: 149 mg/dL — ABNORMAL HIGH (ref 70–99)
Glucose-Capillary: 82 mg/dL (ref 70–99)
Glucose-Capillary: 124 mg/dL — ABNORMAL HIGH (ref 70–99)

## 2018-03-21 LAB — SODIUM
SODIUM: 122 mmol/L — AB (ref 135–145)
Sodium: 126 mmol/L — ABNORMAL LOW (ref 135–145)
Sodium: 125 mmol/L — ABNORMAL LOW (ref 135–145)
Sodium: 129 mmol/L — ABNORMAL LOW (ref 135–145)

## 2018-03-21 LAB — HEMOGLOBIN AND HEMATOCRIT, BLOOD
HCT: 22.2 % — ABNORMAL LOW (ref 36.0–46.0)
Hemoglobin: 7.5 g/dL — ABNORMAL LOW (ref 12.0–15.0)

## 2018-03-21 NOTE — Progress Notes (Signed)
This RN received notice of orders by MD from day shift RN to hold NS 3%, after considering sodium level of 122. After reviewing repeat sodium levels of 126, this RN paged E-Link MD, who recommended leaving the drip on hold, and also consulted GYN Oncology on-call, with no new orders given. Will continue to monitor patient sodium levels and mentation.

## 2018-03-21 NOTE — Progress Notes (Deleted)
This RN received notice of orders by MD from day shift RN to hold NS 3%, after considering sodium level of 122. After reviewing repeat sodium levels of 126, this RN paged E-Link MD, who recommended leaving the drip on hold, and also consulted GYN Oncology on-call, with no new orders given. Will continue to monitor patient sodium levels and mentation.

## 2018-03-21 NOTE — Progress Notes (Signed)
Urology Inpatient Progress Report  ENDOMETRIAL CANCER, UTERINE PROLAPSE  Procedure(s): XI ROBOTIC ASSISTED TOTAL HYSTERECTOMY WITH BILATERAL SALPINGO OOPHORECTOMY SENTINEL NODE BIOPSY XI ROBOTIC ASSISTED LAPAROSCOPIC SACROCOLPOPEXY  2 Days Post-Op   Intv/Subj: Feeling well this morning. Sitting upright in chair. Family at bedside who mention her mental clarity has improved slightly 3% NS running overnight with q2h checks. Na hovering around 122 this AM.  +flatus, BM last night Minimal surgical pain Adequate UOP Hgb 7.7 this AM  Principal Problem:   Endometrial cancer (HCC) Active Problems:   Uterine prolapse  Current Facility-Administered Medications  Medication Dose Route Frequency Provider Last Rate Last Dose  . Chlorhexidine Gluconate Cloth 2 % PADS 6 each  6 each Topical Daily Everitt Amber, MD   6 each at 03/20/18 1630  . enoxaparin (LOVENOX) injection 30 mg  30 mg Subcutaneous Q24H Lahoma Crocker, MD   30 mg at 03/21/18 0754  . insulin aspart (novoLOG) injection 0-15 Units  0-15 Units Subcutaneous TID WC Lahoma Crocker, MD   2 Units at 03/21/18 0755  . insulin aspart (novoLOG) injection 0-5 Units  0-5 Units Subcutaneous QHS Lahoma Crocker, MD   2 Units at 03/19/18 2256  . insulin glargine (LANTUS) injection 12 Units  12 Units Subcutaneous QHS Lahoma Crocker, MD   12 Units at 03/20/18 2140  . lisinopril (PRINIVIL,ZESTRIL) tablet 10 mg  10 mg Oral Daily Lahoma Crocker, MD   10 mg at 03/20/18 1216  . ondansetron (ZOFRAN) tablet 4 mg  4 mg Oral Q6H PRN Lahoma Crocker, MD       Or  . ondansetron [facility]) injection 4 mg  4 mg Intravenous Q6H PRN Lahoma Crocker, MD   4 mg at 03/20/18 0705  . [START ON 03/22/2018] scopolamine (TRANSDERM-SCOP) 1 MG/3DAYS 1.5 mg  1 patch Transdermal Q72H Ardis Hughs, MD      . senna-docusate (Senokot-S) tablet 2 tablet  2 tablet Oral QHS Lahoma Crocker, MD   2 tablet at 03/20/18 2140  . sodium chloride  (hypertonic) 3 % solution   Intravenous Continuous Everitt Amber, MD   Stopped at 03/20/18 1915  . traMADol (ULTRAM) tablet 100 mg  100 mg Oral Q12H PRN Lahoma Crocker, MD         Objective: Vital: Vitals:   03/21/18 0400 03/21/18 0600 03/21/18 0700 03/21/18 0753  BP:  131/86 (!) 148/53   Pulse:   96   Resp:  17    Temp: 98.9 F (37.2 C)   97.9 F (36.6 C)  TempSrc: Oral   Oral  SpO2:   95%   Weight:      Height:       I/Os: I/O last 3 completed shifts: In: 3143.3 [P.O.:480; I.V.:2363.3; IV Piggyback:300] Out: 2550 [Urine:2150; Emesis/NG output:400]  Physical Exam:  General: Patient is in no apparent distress Lungs: Normal respiratory effort, chest expands symmetrically. GI: Incisions are c/d/i. Foley out Ext: lower extremities symmetric  Lab Results: Recent Labs    03/20/18 0440 03/21/18 0313  WBC 11.3* 10.5  HGB 9.2* 7.7*  HCT 27.1* 23.2*   Recent Labs    03/20/18 0440 03/20/18 1346  03/21/18 0030 03/21/18 0313 03/21/18 0723  NA 122* 118*   < > 126* 122* 122*  K 4.7 4.7  --   --  3.9  --   CL 92* 88*  --   --  93*  --   CO2 22 20*  --   --  19*  --   GLUCOSE 236* 183*  --   --  157*  --   BUN 15 17  --   --  17  --   CREATININE 1.40* 1.58*  --   --  1.33*  --   CALCIUM 8.6* 8.7*  --   --  8.4*  --    < > = values in this interval not displayed.   No results for input(s): LABPT, INR in the last 72 hours. No results for input(s): LABURIN in the last 72 hours. Results for orders placed or performed during the hospital encounter of 03/19/18  MRSA PCR Screening     Status: None   Collection Time: 03/20/18  4:40 PM  Result Value Ref Range Status   MRSA by PCR NEGATIVE NEGATIVE Final    Comment:        The GeneXpert MRSA Assay (FDA approved for NASAL specimens only), is one component of a comprehensive MRSA colonization surveillance program. It is not intended to diagnose MRSA infection nor to guide or monitor treatment for MRSA  infections. Performed at [facility], [place] [address], [place], [place] [address]     Studies/Results: No results found.  Assessment: Procedure(s): XI ROBOTIC ASSISTED TOTAL HYSTERECTOMY WITH BILATERAL SALPINGO OOPHORECTOMY SENTINEL NODE BIOPSY XI ROBOTIC ASSISTED LAPAROSCOPIC SACROCOLPOPEXY, 2 Days Post-Op  doing well. Hyponatremia. Post-op delirium/confusion   Plan: Will plan to consult medicine regarding hyponatremia and further management Repeat Na and Hgb at noon today, will continue 3% NS for now Ambulation TID, regular diet Doing well from surgical standpoint. Need to correct hyponatremia prior to safe discharge home  Louis Meckel, MD Urology 03/21/2018, 9:44 AM

## 2018-03-21 NOTE — Consult Note (Signed)
Reason for Consult: Management of hyponatremia Referring Physician: [name] is an 73 y.o. female.  HPI: The patient is a 73 yr old woman who underwent XI ROBOTIC ASSISTED TOTAL HYSTERECTOMY WITH BILATERAL SALPINGO OOPHORECTOMY SENTINEL NODE BIOPSY XI ROBOTIC ASSISTED LAPAROSCOPIC SACROCOLPOPEXY on 3/5.2020 for grade 1 endometroid endometrial cancer and severe symptomatic pelvic organ prolapse. Her sodium prior to the procedure was 138 on 03/12/2018. Post procedure the patient's sodium was 122 and declined to 118 later than day. She has been treated with hypertonic saline which is being monitored by pharmacy. Unfortunately, although initially her sodium increased to 126, it has again dropped to 122 this morning. Along with the hyponatremia, the patient has reportedly been confused.  Otherwise she has been hemodynamically stable. Of interest her blood pressure had dropped this morning to 126/42 from relatively hypertensive pressures from the two previous days. Also she has had a negative fluid balance over night last nigh corresponding with the drop in her sodium again from 126 to 122. Her urine sodium is less than 20 and her urine osmolarity was also low. This indicates that her hyponatremia is possibly due to excessive absorption of a irrigation solution, although it is not clear from the operative reports that this is the case. If this is the case, it is likely that the patient's sodium will spontaneously correct. This will require close monitoring as it could lead to too rapid correction of her sodium. I will continue her IV 3% NS which is being monitored by pharmacy. I will also restrict her oral free water intake.   Past Medical History:  Diagnosis Date  . Anemia   . Cancer [facility])    ENDOMETRIAL CANCER  . Chronic kidney disease    ckd STAGE 3LOV DR Janae Sauce NEPHROLOGY 12-12-17  . Diabetes mellitus   . Diabetes mellitus type 2 in nonobese ([place]) 01/16/2018  . Essential  hypertension 01/16/2018  . Exposure to hepatitis C    AS CHILD  . Exposure to TB 1973   AS CHILD  . Fatty tumor    LEFT SHOULDER REMOVED  . Fibroids   . Hypertension    SLIGHT  . Uterine prolapse 01/16/2018  . UTI (urinary tract infection)    STARTED AMOXICILLIAN FRIDAY 03-13-2018    Past Surgical History:  Procedure Laterality Date  . CERVICAL CONING  74 OR 75  . DILATATION & CURETTAGE/HYSTEROSCOPY WITH MYOSURE N/A 01/30/2018   Procedure: DILATATION & CURETTAGE/HYSTEROSCOPY WITH MYOSURE;  Surgeon: Janyth Pupa, DO;  Location: [facility];  Service: Gynecology;  Laterality: N/A;  . ROBOTIC ASSISTED LAPAROSCOPIC SACROCOLPOPEXY N/A 03/19/2018   Procedure: XI ROBOTIC ASSISTED LAPAROSCOPIC SACROCOLPOPEXY;  Surgeon: Ardis Hughs, MD;  Location: [facility];  Service: Urology;  Laterality: N/A;  . ROBOTIC ASSISTED TOTAL HYSTERECTOMY WITH BILATERAL SALPINGO OOPHERECTOMY Bilateral 03/19/2018   Procedure: XI ROBOTIC ASSISTED TOTAL HYSTERECTOMY WITH BILATERAL SALPINGO OOPHORECTOMY;  Surgeon: Everitt Amber, MD;  Location: [facility];  Service: Gynecology;  Laterality: Bilateral;  . SENTINEL NODE BIOPSY Bilateral 03/19/2018   Procedure: SENTINEL NODE BIOPSY;  Surgeon: Everitt Amber, MD;  Location: [facility];  Service: Gynecology;  Laterality: Bilateral;    Family History  Problem Relation Age of Onset  . Stroke Mother   . Parkinson's disease Mother   . Heart disease Father        HEART ATTACK  . Diabetes Father   . Prostate cancer Father   . Diabetes Sister   . Cancer Sister  Social History:  reports that she has never smoked. She has never used smokeless tobacco. She reports that she does not drink alcohol or use drugs.  Allergies:  Allergies  Allergen Reactions  . Nsaids Other (See Comments)    Due to chronic kidney disease pt does not take any celebrex, motrin, aleve etc.    Medications:  Scheduled: . Chlorhexidine Gluconate Cloth  6 each Topical Daily  . enoxaparin (LOVENOX)  injection  30 mg Subcutaneous Q24H  . insulin aspart  0-15 Units Subcutaneous TID WC  . insulin aspart  0-5 Units Subcutaneous QHS  . insulin glargine  12 Units Subcutaneous QHS  . lisinopril  10 mg Oral Daily  . [START ON 03/22/2018] scopolamine  1 patch Transdermal Q72H  . senna-docusate  2 tablet Oral QHS   Continuous: . sodium chloride (hypertonic) 50 mL/hr (03/21/18 1255)   [id number] **OR** ondansetron (ZOFRAN) IV, traMADol  Results for orders placed or performed during the hospital encounter of 03/19/18 (from the past 48 hour(s))  Glucose, capillary     Status: Abnormal   Collection Time: 03/19/18 12:18 PM  Result Value Ref Range   Glucose-Capillary 159 (H) 70 - 99 mg/dL  Glucose, capillary     Status: Abnormal   Collection Time: 03/19/18  3:43 PM  Result Value Ref Range   Glucose-Capillary 210 (H) 70 - 99 mg/dL  Glucose, capillary     Status: Abnormal   Collection Time: 03/19/18  5:19 PM  Result Value Ref Range   Glucose-Capillary 186 (H) 70 - 99 mg/dL  Glucose, capillary     Status: Abnormal   Collection Time: 03/19/18  6:18 PM  Result Value Ref Range   Glucose-Capillary 184 (H) 70 - 99 mg/dL  Glucose, capillary     Status: Abnormal   Collection Time: 03/19/18 10:44 PM  Result Value Ref Range   Glucose-Capillary 215 (H) 70 - 99 mg/dL   Comment 1 Notify RN   CBC     Status: Abnormal   Collection Time: 03/20/18  4:40 AM  Result Value Ref Range   WBC 11.3 (H) 4.0 - 10.5 K/uL   RBC 3.22 (L) 3.87 - 5.11 MIL/uL   Hemoglobin 9.2 (L) 12.0 - 15.0 g/dL   HCT 27.1 (L) 36.0 - 46.0 %   MCV 84.2 80.0 - 100.0 fL   MCH 28.6 26.0 - 34.0 pg   MCHC 33.9 30.0 - 36.0 g/dL   RDW 12.8 11.5 - 15.5 %   Platelets 211 150 - 400 K/uL   nRBC 0.0 0.0 - 0.2 %    Comment: Performed at [facility], [place] [address] [place], [place] [address]  Basic metabolic panel     Status: Abnormal   Collection Time: 03/20/18  4:40 AM  Result Value Ref Range   Sodium 122 (L) 135 -  145 mmol/L   Potassium 4.7 3.5 - 5.1 mmol/L   Chloride 92 (L) 98 - 111 mmol/L   CO2 22 22 - 32 mmol/L   Glucose, Bld 236 (H) 70 - 99 mg/dL   BUN 15 8 - 23 mg/dL   Creatinine, Ser 1.40 (H) 0.44 - 1.00 mg/dL   Calcium 8.6 (L) 8.9 - 10.3 mg/dL   GFR calc non Af Amer 37 (L) >60 mL/min   GFR calc Af Amer 43 (L) >60 mL/min   Anion gap 8 5 - 15    Comment: Performed at [facility], [place] [address], [place], [place] [address]  Glucose, capillary  Status: Abnormal   Collection Time: 03/20/18  7:51 AM  Result Value Ref Range   Glucose-Capillary 258 (H) 70 - 99 mg/dL  Glucose, capillary     Status: Abnormal   Collection Time: 03/20/18 12:05 PM  Result Value Ref Range   Glucose-Capillary 215 (H) 70 - 99 mg/dL  Basic metabolic panel     Status: Abnormal   Collection Time: 03/20/18  1:46 PM  Result Value Ref Range   Sodium 118 (LL) 135 - 145 mmol/L    Comment: CRITICAL RESULT CALLED TO, READ BACK BY AND VERIFIED WITH: T.THURMAN AT 1439 ON 03/20/18 BY N.THOMPSON    Potassium 4.7 3.5 - 5.1 mmol/L   Chloride 88 (L) 98 - 111 mmol/L   CO2 20 (L) 22 - 32 mmol/L   Glucose, Bld 183 (H) 70 - 99 mg/dL   BUN 17 8 - 23 mg/dL   Creatinine, Ser 1.58 (H) 0.44 - 1.00 mg/dL   Calcium 8.7 (L) 8.9 - 10.3 mg/dL   GFR calc non Af Amer 32 (L) >60 mL/min   GFR calc Af Amer 37 (L) >60 mL/min   Anion gap 10 5 - 15    Comment: Performed at [facility], [place] [address] [place], [place] [address]  Sodium     Status: Abnormal   Collection Time: 03/20/18  3:14 PM  Result Value Ref Range   Sodium 119 (LL) 135 - 145 mmol/L    Comment: CRITICAL RESULT CALLED TO, READ BACK BY AND VERIFIED WITH: KOONTZ,A RN @1627  ON 03/20/2018 JACKSON,K Performed at [facility], [place] [address], [place], [place] [address]   Osmolality     Status: Abnormal   Collection Time: 03/20/18  3:14 PM  Result Value Ref Range   Osmolality 256 (L) 275 - 295 mOsm/kg    Comment: Performed  at [facility], [place] [address] [place], [place] [address]  MRSA PCR Screening     Status: None   Collection Time: 03/20/18  4:40 PM  Result Value Ref Range   MRSA by PCR NEGATIVE NEGATIVE    Comment:        The GeneXpert MRSA Assay (FDA approved for NASAL specimens only), is one component of a comprehensive MRSA colonization surveillance program. It is not intended to diagnose MRSA infection nor to guide or monitor treatment for MRSA infections. Performed at [facility], [place] [address], [place], [place] [address]   Glucose, capillary     Status: Abnormal   Collection Time: 03/20/18  5:20 PM  Result Value Ref Range   Glucose-Capillary 106 (H) 70 - 99 mg/dL  Sodium     Status: Abnormal   Collection Time: 03/20/18  6:29 PM  Result Value Ref Range   Sodium 122 (L) 135 - 145 mmol/L    Comment: Performed at [facility], [place] [address], [place], [place] [address]  Osmolality, urine     Status: Abnormal   Collection Time: 03/20/18  6:51 PM  Result Value Ref Range   Osmolality, Ur 240 (L) 300 - 900 mOsm/kg    Comment: Performed at [place] [address] [place], [place] [address]  Sodium, urine, random     Status: None   Collection Time: 03/20/18  6:51 PM  Result Value Ref Range   Sodium, Ur <10 mmol/L    Comment: Performed at [facility], [place] [address], [place], [place] [address]  Sodium  Status: Abnormal   Collection Time: 03/20/18  8:24 PM  Result Value Ref Range   Sodium 122 (L) 135 - 145 mmol/L    Comment: Performed at [facility], [place] [address], [place], [place] [address]  Glucose, capillary     Status: Abnormal   Collection Time: 03/20/18  9:25 PM  Result Value Ref Range   Glucose-Capillary 132 (H) 70 - 99 mg/dL  Sodium     Status: Abnormal   Collection Time: 03/21/18 12:30 AM  Result Value Ref Range   Sodium 126 (L) 135 - 145 mmol/L    Comment: Performed at [facility], [place] [address], [place], [place] [address]  Basic metabolic panel     Status: Abnormal   Collection Time: 03/21/18  3:13 AM  Result Value Ref Range   Sodium 122 (L) 135 - 145 mmol/L   Potassium 3.9 3.5 - 5.1 mmol/L    Comment: DELTA CHECK NOTED   Chloride 93 (L) 98 - 111 mmol/L   CO2 19 (L) 22 - 32 mmol/L   Glucose, Bld 157 (H) 70 - 99 mg/dL   BUN 17 8 - 23 mg/dL   Creatinine, Ser 1.33 (H) 0.44 - 1.00 mg/dL   Calcium 8.4 (L) 8.9 - 10.3 mg/dL   GFR calc non Af Amer 40 (L) >60 mL/min   GFR calc Af Amer 46 (L) >60 mL/min   Anion gap 10 5 - 15    Comment: Performed at [facility], [place] [address] [place], [place] [address]  CBC     Status: Abnormal   Collection Time: 03/21/18  3:13 AM  Result Value Ref Range   WBC 10.5 4.0 - 10.5 K/uL   RBC 2.72 (L) 3.87 - 5.11 MIL/uL   Hemoglobin 7.7 (L) 12.0 - 15.0 g/dL   HCT 23.2 (L) 36.0 - 46.0 %   MCV 85.3 80.0 - 100.0 fL   MCH 28.3 26.0 - 34.0 pg   MCHC 33.2 30.0 - 36.0 g/dL   RDW 13.2 11.5 - 15.5 %   Platelets 145 (L) 150 - 400 K/uL   nRBC 0.0 0.0 - 0.2 %    Comment: Performed at [facility], [place] [address] [place], [place] [address]  Sodium     Status: Abnormal   Collection Time: 03/21/18  7:23 AM  Result Value Ref Range   Sodium 122 (L) 135 - 145 mmol/L    Comment: Performed at [facility], [place] [address] [place],  [address]  Glucose, capillary     Status: Abnormal   Collection Time: 03/21/18  7:44 AM  Result Value Ref Range   Glucose-Capillary 135 (H) 70 - 99 mg/dL   Comment 1 Notify RN    Comment 2 Document in Chart     No results found.  ROS  Review of Systems - A complete review of systems was reviewed with the patient and was negative except for those items included in the HPI. Blood pressure (!) 126/43, pulse 96, temperature 97.9 F (36.6 C), temperature source Oral, resp. rate 17, height 5' (1.524 m), weight 66.9 kg, SpO2 95  %. Physical Exam  General appearance: alert, cooperative, no distress and pale Head: Normocephalic, without obvious abnormality, atraumatic Eyes: conjunctivae/corneas clear. PERRL, EOM's intact. Fundi benign. Neck: no adenopathy, no carotid bruit, no JVD, supple, symmetrical, trachea midline and thyroid not enlarged, symmetric, no tenderness/mass/nodules Lungs: No increased work of breathing. No wheezes, rales, or rhonchi are  appreciated. No tactile fremitus. Heart: regular rate and rhythm, S1, S2 normal, no murmur, click, rub or gallop Abdomen: soft, non-tender; bowel sounds normal; no masses,  no organomegaly Extremities: extremities normal, atraumatic, no cyanosis or edema Pulses: 2+ and symmetric Skin: Skin color, texture, turgor normal. No rashes or lesions Neurologic: Grossly normal The patient is mildly confused. She requires reminding of where she is. She is not oriented to date or time. She is fully awake and alert.   Assessment/Plan: Problem  Hyponatremia With Decreased Serum Osmolality  Acute Metabolic Encephalopathy   The patient will be continued on the 3% normal saline and her sodium will be monitored frequently by pharmacy. She will also be placed on a fluid restriction.   Thank you for the opportunity to participate in the care of this interesting patient.  I have seen and examined this patient myself. I have spent 72 minutes in her evaluation and care.   Jamie Macdonald 03/21/2018, 12:10 PM

## 2018-03-22 DIAGNOSIS — Z886 Allergy status to analgesic agent status: Secondary | ICD-10-CM | POA: Diagnosis not present

## 2018-03-22 DIAGNOSIS — I129 Hypertensive chronic kidney disease with stage 1 through stage 4 chronic kidney disease, or unspecified chronic kidney disease: Secondary | ICD-10-CM | POA: Diagnosis present

## 2018-03-22 DIAGNOSIS — C541 Malignant neoplasm of endometrium: Secondary | ICD-10-CM | POA: Diagnosis present

## 2018-03-22 DIAGNOSIS — Z201 Contact with and (suspected) exposure to tuberculosis: Secondary | ICD-10-CM | POA: Diagnosis present

## 2018-03-22 DIAGNOSIS — Z8249 Family history of ischemic heart disease and other diseases of the circulatory system: Secondary | ICD-10-CM | POA: Diagnosis not present

## 2018-03-22 DIAGNOSIS — F329 Major depressive disorder, single episode, unspecified: Secondary | ICD-10-CM | POA: Diagnosis present

## 2018-03-22 DIAGNOSIS — Z205 Contact with and (suspected) exposure to viral hepatitis: Secondary | ICD-10-CM | POA: Diagnosis present

## 2018-03-22 DIAGNOSIS — E1122 Type 2 diabetes mellitus with diabetic chronic kidney disease: Secondary | ICD-10-CM | POA: Diagnosis present

## 2018-03-22 DIAGNOSIS — Z7984 Long term (current) use of oral hypoglycemic drugs: Secondary | ICD-10-CM | POA: Diagnosis not present

## 2018-03-22 DIAGNOSIS — E871 Hypo-osmolality and hyponatremia: Secondary | ICD-10-CM | POA: Diagnosis present

## 2018-03-22 DIAGNOSIS — E876 Hypokalemia: Secondary | ICD-10-CM | POA: Diagnosis not present

## 2018-03-22 DIAGNOSIS — F419 Anxiety disorder, unspecified: Secondary | ICD-10-CM | POA: Diagnosis present

## 2018-03-22 DIAGNOSIS — Z87440 Personal history of urinary (tract) infections: Secondary | ICD-10-CM | POA: Diagnosis not present

## 2018-03-22 DIAGNOSIS — R351 Nocturia: Secondary | ICD-10-CM | POA: Diagnosis present

## 2018-03-22 DIAGNOSIS — N814 Uterovaginal prolapse, unspecified: Secondary | ICD-10-CM | POA: Diagnosis present

## 2018-03-22 DIAGNOSIS — N183 Chronic kidney disease, stage 3 (moderate): Secondary | ICD-10-CM | POA: Diagnosis present

## 2018-03-22 DIAGNOSIS — R339 Retention of urine, unspecified: Secondary | ICD-10-CM | POA: Diagnosis not present

## 2018-03-22 DIAGNOSIS — D62 Acute posthemorrhagic anemia: Secondary | ICD-10-CM | POA: Diagnosis not present

## 2018-03-22 DIAGNOSIS — G9341 Metabolic encephalopathy: Secondary | ICD-10-CM | POA: Diagnosis not present

## 2018-03-22 DIAGNOSIS — E611 Iron deficiency: Secondary | ICD-10-CM | POA: Diagnosis not present

## 2018-03-22 DIAGNOSIS — Z79899 Other long term (current) drug therapy: Secondary | ICD-10-CM | POA: Diagnosis not present

## 2018-03-22 LAB — BASIC METABOLIC PANEL
ANION GAP: 6 (ref 5–15)
Anion gap: 5 (ref 5–15)
BUN: 15 mg/dL (ref 8–23)
BUN: 15 mg/dL (ref 8–23)
CO2: 25 mmol/L (ref 22–32)
CO2: 24 mmol/L (ref 22–32)
Calcium: 8.7 mg/dL — ABNORMAL LOW (ref 8.9–10.3)
Calcium: 8.7 mg/dL — ABNORMAL LOW (ref 8.9–10.3)
Chloride: 101 mmol/L (ref 98–111)
Chloride: 102 mmol/L (ref 98–111)
Creatinine, Ser: 1.3 mg/dL — ABNORMAL HIGH (ref 0.44–1.00)
Creatinine, Ser: 1.27 mg/dL — ABNORMAL HIGH (ref 0.44–1.00)
GFR calc Af Amer: 47 mL/min — ABNORMAL LOW (ref >60)
GFR calc Af Amer: 49 mL/min — ABNORMAL LOW (ref >60)
GFR calc non Af Amer: 41 mL/min — ABNORMAL LOW (ref >60)
GFR calc non Af Amer: 42 mL/min — ABNORMAL LOW (ref >60)
Glucose, Bld: 121 mg/dL — ABNORMAL HIGH (ref 70–99)
Glucose, Bld: 108 mg/dL — ABNORMAL HIGH (ref 70–99)
Potassium: 3.7 mmol/L (ref 3.5–5.1)
Potassium: 3.4 mmol/L — ABNORMAL LOW (ref 3.5–5.1)
SODIUM: 131 mmol/L — AB (ref 135–145)
Sodium: 132 mmol/L — ABNORMAL LOW (ref 135–145)

## 2018-03-22 LAB — SODIUM
Sodium: 133 mmol/L — ABNORMAL LOW (ref 135–145)
Sodium: 135 mmol/L (ref 135–145)

## 2018-03-22 LAB — HEMOGLOBIN AND HEMATOCRIT, BLOOD
HCT: 23.3 % — ABNORMAL LOW (ref 36.0–46.0)
Hemoglobin: 7.5 g/dL — ABNORMAL LOW (ref 12.0–15.0)

## 2018-03-22 LAB — GLUCOSE, CAPILLARY
GLUCOSE-CAPILLARY: 113 mg/dL — AB (ref 70–99)
GLUCOSE-CAPILLARY: 145 mg/dL — AB (ref 70–99)
Glucose-Capillary: 114 mg/dL — ABNORMAL HIGH (ref 70–99)
Glucose-Capillary: 104 mg/dL — ABNORMAL HIGH (ref 70–99)

## 2018-03-22 MED ORDER — POTASSIUM CHLORIDE CRYS ER 20 MEQ PO TBCR
40.0000 meq | EXTENDED_RELEASE_TABLET | Freq: Once | ORAL | Status: AC
  Administered 2018-03-22: 40 meq via ORAL
  Filled 2018-03-22: qty 2

## 2018-03-22 MED ORDER — MAGNESIUM SULFATE 2 GM/50ML IV SOLN
2.0000 g | Freq: Once | INTRAVENOUS | Status: AC
  Administered 2018-03-22: 2 g via INTRAVENOUS
  Filled 2018-03-22: qty 50

## 2018-03-22 NOTE — Plan of Care (Signed)
  Problem: Education: Goal: Knowledge of the prescribed therapeutic regimen will improve Outcome: Progressing   

## 2018-03-22 NOTE — Progress Notes (Addendum)
PROGRESS NOTE/CONSULT NOTE  OAKLEY ORBAN NUU:[id number] DOB: Jan 22, 1945 DOA: 03/19/2018 PCP: Leighton Ruff, MD  Referring physician: Dr. Louis Meckel  Reason for consult: Management of hyponatremia  HPI/Recap of past 24 hours: Jamie Macdonald is a 73 yr old woman who underwent XI ROBOTIC ASSISTED TOTAL HYSTERECTOMY WITH BILATERAL SALPINGO OOPHORECTOMY SENTINEL NODE BIOPSY XI ROBOTIC ASSISTED LAPAROSCOPIC SACROCOLPOPEXY on 3/5.2020 for grade 1 endometroid endometrial cancer and severe symptomatic pelvic organ prolapse. Her sodium prior to the procedure was 138 on 03/12/2018. Post procedure the patient's sodium was 122 and declined to 118 later than day. Treated with hypertonic saline which is being monitored by pharmacy. Unfortunately, although initially her sodium increased to 126, it dropped again to 122 on 03/21/2018.   Along with the hyponatremia, the patient has reportedly been confused.  Her urine sodium is less than 20 and her urine osmolarity was also low. This indicates that her hyponatremia is possibly due to excessive absorption of a irrigation solution, although it is not clear from the operative reports that this is the case. If this is the case, it is likely that the patient's sodium will spontaneously correct. This will require close monitoring as it could lead to too rapid correction of her sodium. I will continue her IV 3% NS which is being monitored by pharmacy. I will also restrict her oral free water intake.   03/22/18: Patient seen and examined at bedside.  No acute events overnight.  This morning her sodium level is improved to 132.  Hypertonic saline on hold by pharmacy.  She is alert and oriented x3.  He has no new complaints.  We will continue to monitor her sodium level off IV hypertonic 3% saline solution.    Assessment/Plan: Principal Problem:   Endometrial cancer ([place]) Active Problems:   Uterine prolapse   Hyponatremia with decreased serum osmolality   Acute  metabolic encephalopathy  Resolving euvolemic hyponatremia Possibly from excessive absorption of a irrigation solution Sodium level improved to 132 Continue to hold off IV 3% normal saline Continue to monitor serum sodium level off IV hypertonic solution Continue fluid restriction 1200 cc/day  Resolved acute metabolic encephalopathy suspect secondary to hyponatremia She is alert and oriented x3 this morning  Hypokalemia Potassium 3.4 Repleted with oral KCl 40 mEq once Added 2 g of IV magnesium Repeat BMP and magnesium level in the morning    Thank you for the opportunity to participate in the care of this patient.  We will continue to follow the patient with you.   Objective: Vitals:   03/22/18 0100 03/22/18 0300 03/22/18 0400 03/22/18 0500  BP:      Pulse: 92 92 (!) 123 82  Resp: 18 16 (!) 21 15  Temp:   98.5 F (36.9 C)   TempSrc:   Oral   SpO2: 97% 96% 91% 93%  Weight:      Height:        Intake/Output Summary (Last 24 hours) at 03/22/2018 3474 Last data filed at 03/22/2018 0139 Gross per 24 hour  Intake 382.47 ml  Output 1200 ml  Net -817.53 ml   Filed Weights   03/19/18 1258 03/20/18 1624  Weight: 63.6 kg 66.9 kg    Exam:  . General: 73 y.o. year-old female well developed well nourished in no acute distress.  Alert and oriented x3. . Cardiovascular: Regular rate and rhythm with no rubs or gallops.  No thyromegaly or JVD noted.   Marland Kitchen Respiratory: Clear to auscultation with no wheezes  or rales. Good inspiratory effort. . Abdomen: Soft nontender nondistended with normal bowel sounds x4 quadrants. . Musculoskeletal: No lower extremity edema. 2/4 pulses in all 4 extremities. . Skin: No ulcerative lesions noted or rashes, . Psychiatry: Mood is appropriate for condition and setting   Data Reviewed: CBC: Recent Labs  Lab 03/20/18 0440 03/21/18 0313 03/21/18 1213  WBC 11.3* 10.5  --   HGB 9.2* 7.7* 7.5*  HCT 27.1* 23.2* 22.2*  MCV 84.2 85.3  --   PLT 211  145*  --    Basic Metabolic Panel: Recent Labs  Lab 03/20/18 1346  03/21/18 0313  03/21/18 1213 03/21/18 1550 03/21/18 2020 03/22/18 0013 03/22/18 0324  NA 118*   < > 122*   < > 125* 129* 129* 131* 132*  K 4.7  --  3.9  --   --   --  3.8 3.7 3.4*  CL 88*  --  93*  --   --   --  100 101 102  CO2 20*  --  19*  --   --   --  23 25 24   GLUCOSE 183*  --  157*  --   --   --  134* 121* 108*  BUN 17  --  17  --   --   --  15 15 15   CREATININE 1.58*  --  1.33*  --   --   --  1.34* 1.30* 1.27*  CALCIUM 8.7*  --  8.4*  --   --   --  8.5* 8.7* 8.7*   < > = values in this interval not displayed.   GFR: Estimated Creatinine Clearance: 34.2 mL/min (A) (by C-G formula based on SCr of 1.27 mg/dL (H)). Liver Function Tests: No results for input(s): AST, ALT, ALKPHOS, BILITOT, PROT, ALBUMIN in the last 168 hours. No results for input(s): LIPASE, AMYLASE in the last 168 hours. No results for input(s): AMMONIA in the last 168 hours. Coagulation Profile: No results for input(s): INR, PROTIME in the last 168 hours. Cardiac Enzymes: No results for input(s): CKTOTAL, CKMB, CKMBINDEX, TROPONINI in the last 168 hours. BNP (last 3 results) No results for input(s): PROBNP in the last 8760 hours. HbA1C: No results for input(s): HGBA1C in the last 72 hours. CBG: Recent Labs  Lab 03/21/18 0744 03/21/18 1308 03/21/18 1551 03/21/18 2107 03/22/18 0815  GLUCAP 135* 149* 82 124* 113*   Lipid Profile: No results for input(s): CHOL, HDL, LDLCALC, TRIG, CHOLHDL, LDLDIRECT in the last 72 hours. Thyroid Function Tests: No results for input(s): TSH, T4TOTAL, FREET4, T3FREE, THYROIDAB in the last 72 hours. Anemia Panel: No results for input(s): VITAMINB12, FOLATE, FERRITIN, TIBC, IRON, RETICCTPCT in the last 72 hours. Urine analysis:    Component Value Date/Time   COLORURINE YELLOW 03/12/2018 [place] 03/12/2018 0849   LABSPEC 1.008 03/12/2018 0849   PHURINE 5.0 03/12/2018 0849    GLUCOSEU NEGATIVE 03/12/2018 0849   HGBUR NEGATIVE 03/12/2018 0849   BILIRUBINUR NEGATIVE 03/12/2018 [place] 03/12/2018 0849   PROTEINUR NEGATIVE 03/12/2018 0849   NITRITE NEGATIVE 03/12/2018 0849   LEUKOCYTESUR LARGE (A) 03/12/2018 0849   Sepsis Labs: @LABRCNTIP (procalcitonin:4,lacticidven:4)  ) Recent Results (from the past 240 hour(s))  Urine culture     Status: Abnormal   Collection Time: 03/12/18  8:49 AM  Result Value Ref Range Status   Specimen Description   Final    URINE, CLEAN CATCH Performed at [facility], [address]  [place], [place] [address]    Special Requests   Final    NONE Performed at [facility], [place] [address], [place], [place] [address]    Culture (A)  Final    40,000 COLONIES/mL GROUP B STREP(S.AGALACTIAE)ISOLATED TESTING AGAINST S. AGALACTIAE NOT ROUTINELY PERFORMED DUE TO PREDICTABILITY OF AMP/PEN/VAN SUSCEPTIBILITY. Performed at [facility], [place] [address], [place], [place] [address]    Report Status 03/13/2018 FINAL  Final  MRSA PCR Screening     Status: None   Collection Time: 03/20/18  4:40 PM  Result Value Ref Range Status   MRSA by PCR NEGATIVE NEGATIVE Final    Comment:        The GeneXpert MRSA Assay (FDA approved for NASAL specimens only), is one component of a comprehensive MRSA colonization surveillance program. It is not intended to diagnose MRSA infection nor to guide or monitor treatment for MRSA infections. Performed at [facility], [place] [address] [place], [place] [address]       Studies: No results found.  Scheduled Meds: . Chlorhexidine Gluconate Cloth  6 each Topical Daily  . enoxaparin (LOVENOX) injection  30 mg Subcutaneous Q24H  . insulin aspart  0-15 Units Subcutaneous TID WC  . insulin aspart  0-5 Units Subcutaneous QHS  . insulin glargine  12 Units Subcutaneous QHS  . lisinopril  10 mg Oral Daily  . scopolamine  1 patch  Transdermal Q72H  . senna-docusate  2 tablet Oral QHS    Continuous Infusions:   LOS: 0 days     Kayleen Memos, MD [facility] Pager [phone]  If 7PM-7AM, please contact night-coverage [email or web address] Password [email or web address] 03/22/2018, 8:22 AM

## 2018-03-22 NOTE — Progress Notes (Signed)
Urology Inpatient Progress Report  ENDOMETRIAL CANCER, UTERINE PROLAPSE  Procedure(s): XI ROBOTIC ASSISTED TOTAL HYSTERECTOMY WITH BILATERAL SALPINGO OOPHORECTOMY SENTINEL NODE BIOPSY XI ROBOTIC ASSISTED LAPAROSCOPIC SACROCOLPOPEXY  3 Days Post-Op   Intv/Subj: Feeling very well this AM.  Improved mental status and clarity Na 133 this AM. 3% NS has been stopped Hgb 7.5 and stable From surgical standpoint she is ready to discharge.   Principal Problem:   Endometrial cancer ([place]) Active Problems:   Uterine prolapse   Hyponatremia with decreased serum osmolality   Acute metabolic encephalopathy  Current Facility-Administered Medications  Medication Dose Route Frequency Provider Last Rate Last Dose  . Chlorhexidine Gluconate Cloth 2 % PADS 6 each  6 each Topical Daily Everitt Amber, MD   6 each at 03/21/18 1049  . enoxaparin (LOVENOX) injection 30 mg  30 mg Subcutaneous Q24H Lahoma Crocker, MD   30 mg at 03/22/18 1034  . insulin aspart (novoLOG) injection 0-15 Units  0-15 Units Subcutaneous TID WC Lahoma Crocker, MD   2 Units at 03/21/18 1312  . insulin aspart (novoLOG) injection 0-5 Units  0-5 Units Subcutaneous QHS Lahoma Crocker, MD   2 Units at 03/19/18 2256  . insulin glargine (LANTUS) injection 12 Units  12 Units Subcutaneous QHS Lahoma Crocker, MD   12 Units at 03/21/18 2233  . lisinopril (PRINIVIL,ZESTRIL) tablet 10 mg  10 mg Oral Daily Lahoma Crocker, MD   10 mg at 03/22/18 1042  . ondansetron (ZOFRAN) tablet 4 mg  4 mg Oral Q6H PRN Lahoma Crocker, MD       Or  . ondansetron [facility]) injection 4 mg  4 mg Intravenous Q6H PRN Lahoma Crocker, MD   4 mg at 03/20/18 0705  . scopolamine (TRANSDERM-SCOP) 1 MG/3DAYS 1.5 mg  1 patch Transdermal Q72H Ardis Hughs, MD      . senna-docusate (Senokot-S) tablet 2 tablet  2 tablet Oral QHS Lahoma Crocker, MD   2 tablet at 03/20/18 2140  . traMADol (ULTRAM) tablet 100 mg  100 mg Oral Q12H PRN  Lahoma Crocker, MD         Objective: Vital: Vitals:   03/22/18 0900 03/22/18 1000 03/22/18 1042 03/22/18 1100  BP:   (!) 155/73   Pulse: 92 97  96  Resp: (!) 23 16  19   Temp:      TempSrc:      SpO2: 98% 97%  99%  Weight:      Height:       I/Os: I/O last 3 completed shifts: In: 482.5 [P.O.:300; I.V.:182.5] Out: 2300 [Urine:2300]  Physical Exam:  General: Patient is in no apparent distress Lungs: Normal respiratory effort, chest expands symmetrically. GI: Incisions are c/d/i. Minor bruising bilateral flanks Foley out Ext: lower extremities symmetric  Lab Results: Recent Labs    03/20/18 0440 03/21/18 0313 03/21/18 1213 03/22/18 0937  WBC 11.3* 10.5  --   --   HGB 9.2* 7.7* 7.5* 7.5*  HCT 27.1* 23.2* 22.2* 23.3*   Recent Labs    03/21/18 2020 03/22/18 0013 03/22/18 0324 03/22/18 0821  NA 129* 131* 132* 133*  K 3.8 3.7 3.4*  --   CL 100 101 102  --   CO2 23 25 24   --   GLUCOSE 134* 121* 108*  --   BUN 15 15 15   --   CREATININE 1.34* 1.30* 1.27*  --   CALCIUM 8.5* 8.7* 8.7*  --    No results for input(s): LABPT, INR in the last 72 hours. No  results for input(s): LABURIN in the last 72 hours. Results for orders placed or performed during the hospital encounter of 03/19/18  MRSA PCR Screening     Status: None   Collection Time: 03/20/18  4:40 PM  Result Value Ref Range Status   MRSA by PCR NEGATIVE NEGATIVE Final    Comment:        The GeneXpert MRSA Assay (FDA approved for NASAL specimens only), is one component of a comprehensive MRSA colonization surveillance program. It is not intended to diagnose MRSA infection nor to guide or monitor treatment for MRSA infections. Performed at [facility], [place] [address], [place], [place] [address]     Studies/Results: No results found.  Assessment: Procedure(s): XI ROBOTIC ASSISTED TOTAL HYSTERECTOMY WITH BILATERAL SALPINGO OOPHORECTOMY SENTINEL NODE BIOPSY XI ROBOTIC  ASSISTED LAPAROSCOPIC SACROCOLPOPEXY, 3 Days Post-Op  doing well. Hyponatremia. Post-op delirium/confusion   Plan: Appreciate medicine consult and assistance with hyponatremia, seems to be resolving Agree with observation today and ensure stable electrolytes tomorrow Okay to discharge from surgical standpoint once lab work stable

## 2018-03-23 ENCOUNTER — Other Ambulatory Visit: Payer: Self-pay | Admitting: Gynecologic Oncology

## 2018-03-23 DIAGNOSIS — E871 Hypo-osmolality and hyponatremia: Secondary | ICD-10-CM

## 2018-03-23 LAB — BASIC METABOLIC PANEL
Anion gap: 5 (ref 5–15)
BUN: 16 mg/dL (ref 8–23)
CO2: 26 mmol/L (ref 22–32)
Calcium: 8.9 mg/dL (ref 8.9–10.3)
Chloride: 105 mmol/L (ref 98–111)
Creatinine, Ser: 1.26 mg/dL — ABNORMAL HIGH (ref 0.44–1.00)
GFR calc Af Amer: 49 mL/min — ABNORMAL LOW (ref >60)
GFR calc non Af Amer: 43 mL/min — ABNORMAL LOW (ref >60)
Glucose, Bld: 104 mg/dL — ABNORMAL HIGH (ref 70–99)
POTASSIUM: 4.1 mmol/L (ref 3.5–5.1)
Sodium: 136 mmol/L (ref 135–145)

## 2018-03-23 LAB — GLUCOSE, CAPILLARY: Glucose-Capillary: 112 mg/dL — ABNORMAL HIGH (ref 70–99)

## 2018-03-23 LAB — CBC
HCT: 22.8 % — ABNORMAL LOW (ref 36.0–46.0)
Hemoglobin: 7.3 g/dL — ABNORMAL LOW (ref 12.0–15.0)
MCH: 29 pg (ref 26.0–34.0)
MCHC: 32 g/dL (ref 30.0–36.0)
MCV: 90.5 fL (ref 80.0–100.0)
NRBC: 0 % (ref 0.0–0.2)
Platelets: 149 10*3/uL — ABNORMAL LOW (ref 150–400)
RBC: 2.52 MIL/uL — AB (ref 3.87–5.11)
RDW: 14 % (ref 11.5–15.5)
WBC: 5.6 10*3/uL (ref 4.0–10.5)

## 2018-03-23 LAB — IRON AND TIBC
Iron: 50 ug/dL (ref 28–170)
SATURATION RATIOS: 17 % (ref 10.4–31.8)
TIBC: 298 ug/dL (ref 250–450)
UIBC: 248 ug/dL

## 2018-03-23 LAB — SAMPLE TO BLOOD BANK

## 2018-03-23 LAB — FERRITIN: Ferritin: 99 ng/mL (ref 11–307)

## 2018-03-23 LAB — MAGNESIUM: MAGNESIUM: 2.1 mg/dL (ref 1.7–2.4)

## 2018-03-23 MED ORDER — SENNOSIDES-DOCUSATE SODIUM 8.6-50 MG PO TABS: 1.0000 | ORAL_TABLET | Freq: Every day | ORAL | 0 refills | Status: DC

## 2018-03-23 NOTE — Progress Notes (Signed)
PROGRESS NOTE/CONSULT NOTE  Jamie Macdonald:[id number] DOB: [birth year] DOA: 03/19/2018 PCP: Leighton Ruff, MD  Referring physician: Dr. Louis Meckel  Reason for consult: Management of hyponatremia  HPI/Recap of past 24 hours: Jamie Macdonald is a 73 yr old woman who underwent XI ROBOTIC ASSISTED TOTAL HYSTERECTOMY WITH BILATERAL SALPINGO OOPHORECTOMY SENTINEL NODE BIOPSY XI ROBOTIC ASSISTED LAPAROSCOPIC SACROCOLPOPEXY on 3/5.2020 for grade 1 endometroid endometrial cancer and severe symptomatic pelvic organ prolapse. Her sodium prior to the procedure was 138 on 03/12/2018. Post procedure the patient's sodium was 122 and declined to 118 later than day. Treated with hypertonic saline which is being monitored by pharmacy. Unfortunately, although initially her sodium increased to 126, it dropped again to 122 on 03/21/2018.   Along with the hyponatremia, the patient has reportedly been confused.  Her urine sodium is less than 20 and her urine osmolarity was also low. This indicates that her hyponatremia is possibly due to excessive absorption of a irrigation solution, although it is not clear from the operative reports that this is the case. If this is the case, it is likely that the patient's sodium will spontaneously correct. This will require close monitoring as it could lead to too rapid correction of her sodium. I will continue her IV 3% NS which is being monitored by pharmacy. I will also restrict her oral free water intake.   03/22/18: This morning her sodium level is improved to 132.  Hypertonic saline on hold by pharmacy.  She is alert and oriented x3.  He has no new complaints.  We will continue to monitor her sodium level off IV hypertonic 3% saline solution.  03/23/18: Patient seen and examined at bedside.  No acute events overnight.  She has no new complaints.  She is alert and oriented x3.  Independently reviewed vital signs and lab studies which are stable.  Has been off IV hypertonic fluid  since 03/21/2018.  Sodium 136 on 03/23/2018.    Hemoglobin 7.3.  She denies any shortness of breath, palpitations or chest pain.  Advised to follow-up with her PCP regarding her anemia.  Low iron stores, recommend iron supplement.  Plan to discharge by primary today.    Assessment/Plan: Principal Problem:   Endometrial cancer ([name]) Active Problems:   Uterine prolapse   Hyponatremia with decreased serum osmolality   Acute metabolic encephalopathy   Hyponatremia  Resolved postoperative hyponatremia Possibly from excessive absorption of a irrigation solution Sodium level improved to 136, maintain without IV fluid Held off IV 3% normal saline on 03/22/2018 Was on fluid restriction 1200 cc daily Sodium 136 on 03/23/2018  Resolved acute metabolic encephalopathy suspect secondary to hyponatremia She is alert and oriented x3   Resolved hypokalemia post repletion Potassium 4.1 on 03/23/2018  Acute blood loss anemia post surgery Baseline hemoglobin 13 from 03/12/2018 Hemoglobin on 03/23/18 was 7.3 Iron studies positive for iron deficiency Recommend ferrous sulfate 325 mg daily Follow-up with your PCP/GYN   Thank you for the opportunity to participate in the care of this patient.  We will sign off.  Please call with questions.  Objective: Vitals:   03/22/18 1900 03/22/18 2146 03/22/18 2150 03/23/18 0549  BP:  (!) 154/72  (!) 144/76  Pulse: (!) 108 92  85  Resp: 20 18  16   Temp: 98.4 F (36.9 C) 98.5 F (36.9 C)  98.3 F (36.8 C)  TempSrc: Oral Oral  Oral  SpO2: 97% 96%  98%  Weight: 66.9 kg  63.1 kg  Height: 5' (1.524 m)  5' (1.524 m)     Intake/Output Summary (Last 24 hours) at 03/23/2018 1053 Last data filed at 03/23/2018 0945 Gross per 24 hour  Intake 960 ml  Output 2050 ml  Net -1090 ml   Filed Weights   03/20/18 1624 03/22/18 1900 03/22/18 2150  Weight: 66.9 kg 66.9 kg 63.1 kg    Exam:  . General: 73 y.o. year-old female well-developed well-nourished no acute distress.   Alert and oriented x3.   . Cardiovascular: Regular rate and rhythm with no rubs or gallops.  No JVD or thyromegaly.   Marland Kitchen Respiratory: Clear to auscultation with no wheezes or rales.  Good respiratory effort. . Abdomen: Soft nontender nondistended with normal bowel sounds x4 quadrants. . Musculoskeletal: No lower extremity edema. 2/4 pulses in all 4 extremities. Marland Kitchen Psychiatry: Mood is appropriate for condition and setting   Data Reviewed: CBC: Recent Labs  Lab 03/20/18 0440 03/21/18 0313 03/21/18 1213 03/22/18 0937 03/23/18 0358  WBC 11.3* 10.5  --   --  5.6  HGB 9.2* 7.7* 7.5* 7.5* 7.3*  HCT 27.1* 23.2* 22.2* 23.3* 22.8*  MCV 84.2 85.3  --   --  90.5  PLT 211 145*  --   --  364*   Basic Metabolic Panel: Recent Labs  Lab 03/21/18 0313  03/21/18 2020 03/22/18 0013 03/22/18 0324 03/22/18 0821 03/22/18 1147 03/23/18 0358  NA 122*   < > 129* 131* 132* 133* 135 136  K 3.9  --  3.8 3.7 3.4*  --   --  4.1  CL 93*  --  100 101 102  --   --  105  CO2 19*  --  23 25 24   --   --  26  GLUCOSE 157*  --  134* 121* 108*  --   --  104*  BUN 17  --  15 15 15   --   --  16  CREATININE 1.33*  --  1.34* 1.30* 1.27*  --   --  1.26*  CALCIUM 8.4*  --  8.5* 8.7* 8.7*  --   --  8.9  MG  --   --   --   --   --   --   --  2.1   < > = values in this interval not displayed.   GFR: Estimated Creatinine Clearance: 33.4 mL/min (A) (by C-G formula based on SCr of 1.26 mg/dL (H)). Liver Function Tests: No results for input(s): AST, ALT, ALKPHOS, BILITOT, PROT, ALBUMIN in the last 168 hours. No results for input(s): LIPASE, AMYLASE in the last 168 hours. No results for input(s): AMMONIA in the last 168 hours. Coagulation Profile: No results for input(s): INR, PROTIME in the last 168 hours. Cardiac Enzymes: No results for input(s): CKTOTAL, CKMB, CKMBINDEX, TROPONINI in the last 168 hours. BNP (last 3 results) No results for input(s): PROBNP in the last 8760 hours. HbA1C: No results for input(s):  HGBA1C in the last 72 hours. CBG: Recent Labs  Lab 03/22/18 0815 03/22/18 1133 03/22/18 1644 03/22/18 2113 03/23/18 0723  GLUCAP 113* 114* 104* 145* 112*   Lipid Profile: No results for input(s): CHOL, HDL, LDLCALC, TRIG, CHOLHDL, LDLDIRECT in the last 72 hours. Thyroid Function Tests: No results for input(s): TSH, T4TOTAL, FREET4, T3FREE, THYROIDAB in the last 72 hours. Anemia Panel: Recent Labs    03/23/18 0712  FERRITIN 99  TIBC 298  IRON 50   Urine analysis:    Component Value Date/Time  COLORURINE YELLOW 03/12/2018 [place] 03/12/2018 0849   LABSPEC 1.008 03/12/2018 0849   PHURINE 5.0 03/12/2018 [place] 03/12/2018 0849   HGBUR NEGATIVE 03/12/2018 0849   BILIRUBINUR NEGATIVE 03/12/2018 [place] 03/12/2018 0849   PROTEINUR NEGATIVE 03/12/2018 0849   NITRITE NEGATIVE 03/12/2018 0849   LEUKOCYTESUR LARGE (A) 03/12/2018 0849   Sepsis Labs: @LABRCNTIP (procalcitonin:4,lacticidven:4)  ) Recent Results (from the past 240 hour(s))  MRSA PCR Screening     Status: None   Collection Time: 03/20/18  4:40 PM  Result Value Ref Range Status   MRSA by PCR NEGATIVE NEGATIVE Final    Comment:        The GeneXpert MRSA Assay (FDA approved for NASAL specimens only), is one component of a comprehensive MRSA colonization surveillance program. It is not intended to diagnose MRSA infection nor to guide or monitor treatment for MRSA infections. Performed at [facility], [place] [address], [place], [place] [address]       Studies: No results found.  Scheduled Meds: . Chlorhexidine Gluconate Cloth  6 each Topical Daily  . enoxaparin (LOVENOX) injection  30 mg Subcutaneous Q24H  . insulin aspart  0-15 Units Subcutaneous TID WC  . insulin aspart  0-5 Units Subcutaneous QHS  . insulin glargine  12 Units Subcutaneous QHS  . lisinopril  10 mg Oral Daily  . scopolamine  1 patch Transdermal Q72H  .  senna-docusate  2 tablet Oral QHS    Continuous Infusions:   LOS: 1 day     Kayleen Memos, MD [facility] Pager [phone]  If 7PM-7AM, please contact night-coverage [email or web address] Password [facility] 03/23/2018, 10:53 AM

## 2018-03-23 NOTE — Discharge Summary (Signed)
[facility] Discharge Summary  Admit date: 03/19/2018  Discharge date and time: 03/23/18   Discharge to: Home  Discharge Service: Urology  Discharge Attending Physician:  Dr. Louis Meckel  Discharge  Diagnoses: Endometrial cancer [facility]), pelvic organ prolapse  Secondary Diagnosis: Principal Problem:   Endometrial cancer ([place]) Active Problems:   Uterine prolapse   Hyponatremia with decreased serum osmolality   Acute metabolic encephalopathy   Hyponatremia   OR Procedures: Procedure(s): XI ROBOTIC ASSISTED TOTAL HYSTERECTOMY WITH BILATERAL SALPINGO OOPHORECTOMY SENTINEL NODE BIOPSY XI ROBOTIC ASSISTED LAPAROSCOPIC SACROCOLPOPEXY 03/19/2018   Ancillary Procedures: None   Discharge Day Services: The patient was seen and examined by the Urology team both in the morning and immediately prior to discharge.  Vital signs and laboratory values were stable and within normal limits.  The physical exam was benign and unchanged and all surgical wounds were examined.  Discharge instructions were explained and all questions answered.  Subjective  No acute events overnight. Pain Controlled. No fever or chills.  Objective Patient Vitals for the past 8 hrs:  BP Temp Temp src Pulse Resp SpO2  03/23/18 0549 (!) 144/76 98.3 F (36.8 C) Oral 85 16 98 %   No intake/output data recorded.  General Appearance:        No acute distress Lungs:                       Normal work of breathing on room air Heart:                                Regular rate and rhythm Abdomen:                         Soft, non-tender, non-distended. Incisions clean dry and intact with surgical glue. Minor bilateral flank bruising Extremities:                      Warm and well perfused   Hospital Course:  The patient underwent a robotic TAH/BSO with gynecology and a robotic sacrocolpopexy with Dr. Louis Meckel on 03/19/2018.  The patient tolerated the procedure well, was extubated in the OR, and afterwards was taken to the PACU  for routine post-surgical care. When stable the patient was transferred to the floor.   The patient did well postoperatively.  Of note, she did have post-operative issues with hyponatremia with possible resultant confusion/delirium. She was ultimately transferred to stepdown unit for 2 nights on 3% NS and monitoring. Her sodium slowly corrected. Medicine team was helpful in coordinating this care. The patient's diet was slowly advanced and at the time of discharge was tolerating a regular diet.  The patient was discharged home 4 Days Post-Op, at which point was tolerating a regular solid diet, was able to void spontaneously, have adequate pain control with P.O. pain medication, and could ambulate without difficulty. The patient will follow up with [place] for post op check.   Condition at Discharge: Improved  Discharge Medications:  Allergies as of 03/23/2018      Reactions   Nsaids Other (See Comments)   Due to chronic kidney disease pt does not take any celebrex, motrin, aleve etc.      Medication List    TAKE these medications   amoxicillin 500 MG capsule Commonly known as:  AMOXIL Take 500 mg by mouth 3 (three) times daily. FOR UTI STARTS ENDS Thursday  AT END OF DAY , WILL BE DUE 2 DOSES AFTER ADMISSION   estradiol 0.1 MG/GM vaginal cream Commonly known as:  ESTRACE Insert a pea size amount in the vagina at bedtime every other night for 6 weeks   FISH OIL-FLAX OIL-BORAGE OIL PO Take 1 capsule by mouth daily.   lisinopril 10 MG tablet Commonly known as:  PRINIVIL,ZESTRIL Take 10 mg by mouth daily.   metFORMIN 500 MG tablet Commonly known as:  GLUCOPHAGE Take 500 mg by mouth 2 (two) times daily with a meal.   MULTIVITAMIN ADULT PO Take 1 tablet by mouth daily.   senna-docusate 8.6-50 MG tablet Commonly known as:  Senokot-S Take 2 tablets by mouth at bedtime. Hold if having loose stools   senna-docusate 8.6-50 MG tablet Commonly known as:  Senokot-S Take 1 tablet by mouth daily.    traMADol 50 MG tablet Commonly known as:  ULTRAM Take 1 tablet (50 mg total) by mouth every 6 (six) hours as needed for severe pain. Do not take and drive   Vitamin D 50 MCG (2000 UT) tablet Take 2,000 Units by mouth daily.

## 2018-03-24 ENCOUNTER — Telehealth: Payer: Self-pay

## 2018-03-24 NOTE — Telephone Encounter (Signed)
Jamie Macdonald states that she is doing well today. Her appetite is beginning to increase. No pain noted. She has BM in Hospital. She feels that she may need to have on soon. Pt taking senokot-s as directed. She noticed a slight yellow vaginal drainage today. Afebrile. Told her that [name], NP stated that this drainage is normal after the surgery. Pt aware of lab appointment on Thursday 03-26-18 at 01-13-19 to recheck sodium and blood counts. Pt appreciated the follow up.

## 2018-03-25 ENCOUNTER — Telehealth: Payer: Self-pay

## 2018-03-25 NOTE — Telephone Encounter (Signed)
Incoming call from patient. Patient wanted to verify her next appt with lab and that it was here in the cancer center.  Verified as 3-12 at 12:30 pm in cancer center lab and gave her directions as well.  No other needs per pt at this time.

## 2018-03-26 ENCOUNTER — Inpatient Hospital Stay: Payer: Medicare HMO | Attending: Gynecologic Oncology

## 2018-03-26 ENCOUNTER — Telehealth: Payer: Self-pay

## 2018-03-26 ENCOUNTER — Other Ambulatory Visit: Payer: Self-pay

## 2018-03-26 ENCOUNTER — Telehealth: Payer: Self-pay | Admitting: Gynecologic Oncology

## 2018-03-26 DIAGNOSIS — E871 Hypo-osmolality and hyponatremia: Secondary | ICD-10-CM

## 2018-03-26 DIAGNOSIS — C541 Malignant neoplasm of endometrium: Secondary | ICD-10-CM | POA: Insufficient documentation

## 2018-03-26 LAB — BASIC METABOLIC PANEL
Anion gap: 11 (ref 5–15)
BUN: 20 mg/dL (ref 8–23)
CHLORIDE: 103 mmol/L (ref 98–111)
CO2: 26 mmol/L (ref 22–32)
Calcium: 9.8 mg/dL (ref 8.9–10.3)
Creatinine, Ser: 1.48 mg/dL — ABNORMAL HIGH (ref 0.44–1.00)
GFR calc Af Amer: 41 mL/min — ABNORMAL LOW (ref >60)
GFR calc non Af Amer: 35 mL/min — ABNORMAL LOW (ref >60)
Glucose, Bld: 160 mg/dL — ABNORMAL HIGH (ref 70–99)
Potassium: 4.1 mmol/L (ref 3.5–5.1)
Sodium: 140 mmol/L (ref 135–145)

## 2018-03-26 LAB — CBC WITH DIFFERENTIAL (CANCER CENTER ONLY)
Abs Immature Granulocytes: 0.05 10*3/uL (ref 0.00–0.07)
Basophils Absolute: 0 10*3/uL (ref 0.0–0.1)
Basophils Relative: 0 %
EOS PCT: 2 %
Eosinophils Absolute: 0.1 10*3/uL (ref 0.0–0.5)
HCT: 26 % — ABNORMAL LOW (ref 36.0–46.0)
Hemoglobin: 8.5 g/dL — ABNORMAL LOW (ref 12.0–15.0)
Immature Granulocytes: 1 %
LYMPHS PCT: 14 %
Lymphs Abs: 1 10*3/uL (ref 0.7–4.0)
MCH: 29.4 pg (ref 26.0–34.0)
MCHC: 32.7 g/dL (ref 30.0–36.0)
MCV: 90 fL (ref 80.0–100.0)
Monocytes Absolute: 0.7 10*3/uL (ref 0.1–1.0)
Monocytes Relative: 9 %
NEUTROS PCT: 74 %
Neutro Abs: 5.4 10*3/uL (ref 1.7–7.7)
Platelet Count: 317 10*3/uL (ref 150–400)
RBC: 2.89 MIL/uL — ABNORMAL LOW (ref 3.87–5.11)
RDW: 14.7 % (ref 11.5–15.5)
WBC Count: 7.2 10*3/uL (ref 4.0–10.5)
nRBC: 0 % (ref 0.0–0.2)

## 2018-03-26 NOTE — Telephone Encounter (Signed)
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 Intermediate risk for recurrence given grade 2 and age > 1.  Recommend consideration for vaginal brachytherapy.  Would not start this before 8 weeks postop due to sacrocolpopexy.  Will set up appointment with Dr Sondra Come to discuss further.  Thereasa Solo, MD

## 2018-03-26 NOTE — Telephone Encounter (Signed)
Incoming call from pt in regards to her most recent pathology report- per [name] NP - I let pt know that Dr Denman George in in [place] but would review the results and call her back.  Pt voiced understanding.  Per [name] NP "let her know her sodium is within normal limits, want to make sure she is drinking well, things with substance not water alone"  Pt reports she is drinking water, tea, gatorade.  She is keeping a record of ounces which is close to 50 ounces daily.  Notified [name] NP. Reminded her primarily things with substance not just plain water.  Pt voiced understanding.  Pt would like her nephrologist Dr. [facility] and her PCP Dr. Leighton Ruff to have copies of her latest lab work - faxed to their offices per pt request.  No other needs per pt at this time.

## 2018-03-27 ENCOUNTER — Encounter: Payer: Self-pay | Admitting: Oncology

## 2018-03-27 ENCOUNTER — Telehealth: Payer: Self-pay

## 2018-03-27 ENCOUNTER — Telehealth: Payer: Self-pay | Admitting: *Deleted

## 2018-03-27 DIAGNOSIS — C541 Malignant neoplasm of endometrium: Secondary | ICD-10-CM

## 2018-03-27 NOTE — Telephone Encounter (Signed)
Patient called and asked "When I start radiation will it be there and how much are the appt going to cost." Explained that the radiation appt will be here at the cancer center and gave her the number to billing. [phone]

## 2018-03-27 NOTE — Telephone Encounter (Signed)
Outgoing call to patient per [name] NP regarding patient had some questions and these answers are from [facility]:  If cancer were to come back, where would it be?  Most likely at top of vagina  She can wear support stockings if she'd like , can wear for 4-6 hours then take off for 30 min.  Should she be just at home, avoid crowds?  Yes, since she is older and just had surgery, should stay away from groups  What vitamins was she taking that she would like to resume?  Was taking mult-vitamin with iron- per Lenna Sciara NP ok to resume, continue using Senna or Colace also to prevent constipation  Pt voiced understanding. No other needs per pt at this time.

## 2018-03-30 ENCOUNTER — Telehealth: Payer: Self-pay

## 2018-03-30 NOTE — Telephone Encounter (Signed)
Pt states that she has had some diarrhea and nausea the last couple of days.  Her daughter and friend that brought her to the hospital last week has nausea and diarrhea as well. Pt stopped using the Senokot-S. Encouraged her to drink 8 ounces of fluid every 90 minutes to keep up with diarrhea. Reviewed the BRAT diet with patient. She is to call if she gets a fever of 101.5. Pt verbalized understanding.

## 2018-03-30 NOTE — Telephone Encounter (Signed)
Told Ms Wiest that she was to finish the amoxicillin prescription called in prior to her surgery for UTI.  There were 2 doses left. Pt did not complete the ATB as directed.  Ms Khokhar is afebrile and asymptomatic for UTI. Told her that [name], NP said is received IV antibiotics with surgery that should help with the UTI.  It is so far out from the surgery that she does not need to take the last 2 doses of amoxicillin as she is asymptomatic. Pt verbalized understanding.

## 2018-04-01 ENCOUNTER — Telehealth: Payer: Self-pay

## 2018-04-01 NOTE — Telephone Encounter (Signed)
Incoming call from patient, she wanted to know if her labwork, specifically her sodium level would be rechecked.  Per [name] NP her last sodium level was within normal limits and previously gave her instructions regarding fluid intake so per Dr Denman George, she will recheck lab work at next follow-up appt.  Pt voiced understanding and no other needs per pt at this time.

## 2018-04-02 DIAGNOSIS — N8110 Cystocele, unspecified: Secondary | ICD-10-CM | POA: Diagnosis not present

## 2018-04-06 ENCOUNTER — Telehealth: Payer: Self-pay

## 2018-04-06 NOTE — Telephone Encounter (Signed)
Ms Spayd states that the belly button incision and the incision under her left breast are pinkish/red. The incisions are itchy.   No drainage noted. Afebrile Pt has been applying alcohol to the incisions.   There is a slight glaze of the glue on incisions. Pt has been peeling glue off.   Instructed Ms Garton that the glue is to come off on its own.  Trying to peel it off can be irritating. She can apply Triple ATB  ointment to incisions twice a day. Pt to call if she develops a Temp of 100.5 or greater and or there is purlent drainage from incisions. Pt states that she is having intermittent vaginal spotting.  Told Ms Banos that this is to be expected after the surgery.  She should call if the bleeding increases to a menstrual flow. Pt verbalized understanding. Above reviewed with   [name], NP.

## 2018-04-08 ENCOUNTER — Other Ambulatory Visit: Payer: Medicare HMO

## 2018-04-08 ENCOUNTER — Other Ambulatory Visit: Payer: Self-pay | Admitting: Gynecologic Oncology

## 2018-04-08 ENCOUNTER — Telehealth: Payer: Self-pay | Admitting: *Deleted

## 2018-04-08 DIAGNOSIS — E871 Hypo-osmolality and hyponatremia: Secondary | ICD-10-CM

## 2018-04-08 NOTE — Telephone Encounter (Signed)
Patient called this morning stating "I was told not to start the estradiol until after I see Dr. Denman George at my post op visit. I haven't picked it up yet. I am still having some spotting but having no pain or fever. I was told not to wear panty, which I'm still doing. I called last week and the nurse told to pick up triple antibiotic ointment and use it. I'm still doing that. I was a few years back by a doctor to take natural vitamin E to help stop my bleeding, Dr. Louis Meckel is ok with me doing this; but I wanted to check with Dr. Denman George first. I am still weak and shaky. I would like Dr. Denman George to check all my las when I see her on April 3rd, but I want to see her first then get them drawn. I feel safe there at the cancer center, I felt compromised at the urology office. I mean I wore a dust mask, but they told to make sure I wear a thick scarf too. Are you all screening people there?" Explained that we are calling patient's the day before and pre-screening them and then again the day of the appointment at the front door. Also explained that no visitors are aloud in the building. Explained that I would give Dr. Denman George and Lenna Sciara APP the message and we will call her back later today.

## 2018-04-08 NOTE — Telephone Encounter (Signed)
Told Ms Labarge that [name], NP said that the vaginal spotting is normal after the surgery she had. She does not need to take vitamin E or start the estradiol.  This will be discussed at her postoperative visit now on 04-15-18. She needs to arrive at 1330 to check in and have lab and then see Dr. Denman George. Pt verbalized understanding.

## 2018-04-10 ENCOUNTER — Telehealth: Payer: Self-pay | Admitting: *Deleted

## 2018-04-10 NOTE — Telephone Encounter (Signed)
Patient called to speak with the person she believe to be Meeka. Explained that there is no one here with that name. Patient continue to ask about having her labs drawn with her appt with Dr. Denman George. Patient stated "I want my full body labs done there at the cancer center." Explained that we are drawn some labs that day of her appt. Patient then stated "Well I know that radiation drains your body, wouldn't they want to know what they are before starting that and then through treatment." Explained to the patient that question is for Dr. Sondra Come.

## 2018-04-14 ENCOUNTER — Telehealth: Payer: Self-pay | Admitting: *Deleted

## 2018-04-14 NOTE — Progress Notes (Signed)
Follow-up Note: Gyn-Onc  Consult was requested by Dr. Nelda Marseille for the evaluation of Jamie Macdonald 74 y.o. female  CC:  Chief Complaint  Patient presents with  . Endometrial cancer [facility])    Assessment/Plan:  Jamie Macdonald  is a 73 y.o.  year old with stage  endometrioid endometrial adenocarcinoma, and vaginal prolapse, s/p staging and abdominal sacrocolpopexy.  High/intermediate risk factors for recurrence. Recommendation is for vaginal brachytherapy to reduce risk for local recurrence in accordance with NCCN guidelines.  I discussed this with the patient. I discussed the role of adjuvant therapy. I discussed prognosis and risk for recurrence. We reviewed symptoms concerning for recurrence and she will see me if these develop prior to her scheduled appointment.  After completing adjuvant therapy I recommend she follow-up at 3 monthly intervals for symptom review, physical examination and pelvic examination. Pap smear is not recommended in routine endometrial cancer surveillance. After 2 years we will space these visits to every 6 months, and then annually if recurrence has not developed within 5 years. All questions were answered.  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  This is causing significant sequelae given that her actions are physically harmful.  An example of this would be the overconsumption of water that lead to dangerous hyponatremia.  Additionally the way she has managed her incisions postoperatively were also not as directed by staff but instead caused irritation to her overlying skin.  I discussed that I was concerned regarding her levels of anxiety and how this is dysfunctional for her postoperative recovery.  I explained how much of her difficult recovery, in fact all of her difficult recovery, was secondary to self-harm was induced secondary to actions led by anxiety.  I recommend consultation with psychiatry.  I discussed this with Jamie Macdonald and she declined.   She states that she is in a good place spiritually.  HPI: Jamie Macdonald is a 73 year old woman who is seen in consultation at the request of Dr Nelda Marseille for grade 1 endometrial cancer.   The patient reports a history of occasional bleeding symptoms since the beginning of November 2019.  She was on estradiol while using her pessary which had been placed in 2019 for severe pelvic organ prolapse.  She was evaluated by Dr. Nelda Marseille who performed a D&C on January 30, 2018.  This revealed well differentiated endometrial adenocarcinoma with foci approaching moderate differentiation.  The patient has a history of severe pelvic organ prolapse that is symptomatic.  She saw Dr. Maryland Pink in May 2018 who prescribed vaginal estrogen.  She then was seen back later in the year and recommended hysterectomy with a tacking procedure.  The patient was concerned about the use of vaginal mesh and therefore declined at this time.  She subsequently saw Dr. Baruch Goldmann who placed a vaginal pessary which does help with her symptoms of prolapse however the patient does not like having to wear a pessary and desires definitive surgical management.  Her medical history is significant for very well controlled type 2 diabetes mellitus.  She regular checks her blood glucose levels and takes oral agent (metformin) but no insulin for this.  Her last hemoglobin A1c was less than 6%.  She also has hypertension and some anxiety and depression.  Her surgical history is significant for a surgical cone and a tubal ligation but no other major abdominal surgeries.  Her family history is unremarkable for malignancy.  Interval Hx:  On 03/19/18 she  underwent a robotic assisted total hysterectomy BSO, SLN biopsy and abdominal sacrocolpopexy. Postoperatively she had a prolonged hospitalization due to self-induced hyponatremia due to water intoxication.   Final pathology revealed stage IA grade 2 endometrioid endometrial cancer with 1 mm depth of invasion total  thickness greater than 30 mm.  This represented less than one half.  No lymphovascular space invasion.  Negative sentinel lymph nodes, cervix, right adnexa.  Tumor measured 1.4 cm.  It was a FIGO grade 2.  Due to her age of 79 and a FIGO grade 2 cancer, she meets high intermediate risk factor criteria. She was determined to have high intermediate risk features and therefore vaginal brachytherapy was recommended in accordance with NCCN guidelines to minimize local recurrence.   She was applying alcohol to her incisions postoperatively as she was worried about them becoming infected. She was not instructed to do this. The glue came off due to this. She was then applying neosporin ointment. She has been drinking G0 gatorade as she was worried about drinking too much plain water. This gave her diarrhea.  She presents to clinic today wearing 3 x N95 respirator masks stacked on top of eachother with paper towels layered in between.   Current Meds:  Outpatient Encounter Medications as of 04/15/2018  Medication Sig  . ACCU-CHEK AVIVA PLUS test strip   . Accu-Chek Softclix Lancets lancets   . Blood Glucose Monitoring Suppl (ACCU-CHEK AVIVA PLUS) w/Device KIT   . Cholecalciferol (VITAMIN D) 50 MCG (2000 UT) tablet Take 2,000 Units by mouth daily.  Marland Kitchen estradiol (ESTRACE) 0.1 MG/GM vaginal cream Insert a pea size amount in the vagina at bedtime every other night for 6 weeks  . Flax Oil-Fish Oil-Borage Oil (FISH OIL-FLAX OIL-BORAGE OIL PO) Take 1 capsule by mouth daily.   Marland Kitchen lisinopril (PRINIVIL,ZESTRIL) 10 MG tablet Take 10 mg by mouth daily.  . metFORMIN (GLUCOPHAGE) 500 MG tablet Take 500 mg by mouth 2 (two) times daily with a meal.   . Multiple Vitamins-Minerals (MULTIVITAMIN ADULT PO) Take 1 tablet by mouth daily.   . [DISCONTINUED] amoxicillin (AMOXIL) 500 MG capsule Take 500 mg by mouth 3 (three) times daily. FOR UTI STARTS ENDS Thursday AT END OF DAY , WILL BE DUE 2 DOSES AFTER ADMISSION  . [DISCONTINUED]  senna-docusate (SENOKOT-S) 8.6-50 MG tablet Take 2 tablets by mouth at bedtime. Hold if having loose stools  . [DISCONTINUED] senna-docusate (SENOKOT-S) 8.6-50 MG tablet Take 1 tablet by mouth daily.  . [DISCONTINUED] traMADol (ULTRAM) 50 MG tablet Take 1 tablet (50 mg total) by mouth every 6 (six) hours as needed for severe pain. Do not take and drive   No facility-administered encounter medications on file as of 04/15/2018.     Allergy:  Allergies  Allergen Reactions  . Nsaids Other (See Comments)    Due to chronic kidney disease pt does not take any celebrex, motrin, aleve etc.    Social Hx:   Social History   Socioeconomic History  . Marital status: Divorced    Spouse name: Not on file  . Number of children: Not on file  . Years of education: Not on file  . Highest education level: Not on file  Occupational History  . Not on file  Social Needs  . Financial resource strain: Not on file  . Food insecurity:    Worry: Not on file    Inability: Not on file  . Transportation needs:    Medical: Not on file    Non-medical: Not  on file  Tobacco Use  . Smoking status: Never Smoker  . Smokeless tobacco: Never Used  Substance and Sexual Activity  . Alcohol use: No  . Drug use: Never  . Sexual activity: Not on file  Lifestyle  . Physical activity:    Days per week: Not on file    Minutes per session: Not on file  . Stress: Not on file  Relationships  . Social connections:    Talks on phone: Not on file    Gets together: Not on file    Attends religious service: Not on file    Active member of club or organization: Not on file    Attends meetings of clubs or organizations: Not on file    Relationship status: Not on file  . Intimate partner violence:    Fear of current or ex partner: Not on file    Emotionally abused: Not on file    Physically abused: Not on file    Forced sexual activity: Not on file  Other Topics Concern  . Not on file  Social History Narrative  .  Not on file    Past Surgical Hx:  Past Surgical History:  Procedure Laterality Date  . CERVICAL CONING  74 OR 75  . DILATATION & CURETTAGE/HYSTEROSCOPY WITH MYOSURE N/A 01/30/2018   Procedure: DILATATION & CURETTAGE/HYSTEROSCOPY WITH MYOSURE;  Surgeon: Janyth Pupa, DO;  Location: [facility];  Service: Gynecology;  Laterality: N/A;  . ROBOTIC ASSISTED LAPAROSCOPIC SACROCOLPOPEXY N/A 03/19/2018   Procedure: XI ROBOTIC ASSISTED LAPAROSCOPIC SACROCOLPOPEXY;  Surgeon: Ardis Hughs, MD;  Location: [facility];  Service: Urology;  Laterality: N/A;  . ROBOTIC ASSISTED TOTAL HYSTERECTOMY WITH BILATERAL SALPINGO OOPHERECTOMY Bilateral 03/19/2018   Procedure: XI ROBOTIC ASSISTED TOTAL HYSTERECTOMY WITH BILATERAL SALPINGO OOPHORECTOMY;  Surgeon: Everitt Amber, MD;  Location: [facility];  Service: Gynecology;  Laterality: Bilateral;  . SENTINEL NODE BIOPSY Bilateral 03/19/2018   Procedure: SENTINEL NODE BIOPSY;  Surgeon: Everitt Amber, MD;  Location: [facility];  Service: Gynecology;  Laterality: Bilateral;    Past Medical Hx:  Past Medical History:  Diagnosis Date  . Anemia   . Cancer [facility])    ENDOMETRIAL CANCER  . Chronic kidney disease    ckd STAGE 3LOV DR Janae Sauce NEPHROLOGY 12-12-17  . Diabetes mellitus   . Diabetes mellitus type 2 in nonobese ([place]) 01/16/2018  . Essential hypertension 01/16/2018  . Exposure to hepatitis C    AS CHILD  . Exposure to TB 1973   AS CHILD  . Fatty tumor    LEFT SHOULDER REMOVED  . Fibroids   . Hypertension    SLIGHT  . Uterine prolapse 01/16/2018  . UTI (urinary tract infection)    STARTED AMOXICILLIAN FRIDAY 03-13-2018    Past Gynecological History:  See HPI No LMP recorded. Patient is postmenopausal.  Family Hx:  Family History  Problem Relation Age of Onset  . Stroke Mother   . Parkinson's disease Mother   . Heart disease Father        HEART ATTACK  . Diabetes Father   . Prostate cancer Father   . Diabetes Sister   . Cancer Sister     Review of  Systems:  Constitutional  Feels well,    ENT Normal appearing ears and nares bilaterally Skin/Breast  No rash, sores, jaundice, itching, dryness Cardiovascular  No chest pain, shortness of breath, or edema  Pulmonary  No cough or wheeze.  Gastro Intestinal  No nausea, vomitting, or diarrhoea. No  bright red blood per rectum, no abdominal pain, change in bowel movement, or constipation.  Genito Urinary  No frequency, urgency, dysuria, no bleeding Musculo Skeletal  No myalgia, arthralgia, joint swelling or pain  Neurologic  No weakness, numbness, change in gait,  Psychology  No depression, anxiety, insomnia.   Vitals:  Blood pressure (!) 164/72, pulse (!) 105, temperature 98.7 F (37.1 C), temperature source Oral, resp. rate 20, height 5' (1.524 m), weight 128 lb (58.1 kg), SpO2 100 %.  Physical Exam: WD in NAD Neck  Supple NROM, without any enlargements.  Lymph Node Survey No cervical supraclavicular or inguinal adenopathy Cardiovascular  Pulse normal rate, regularity and rhythm. S1 and S2 normal.  Lungs  Clear to auscultation bilateraly, without wheezes/crackles/rhonchi. Good air movement.  Skin  No rash/lesions/breakdown  Psychiatry  Alert and oriented to person, place, and time  Abdomen  Normoactive bowel sounds, abdomen soft, non-tender and not obese without evidence of hernia. Incisions intact and completing healed but surrounding skin erythematous from application of ointment reaction.  Back No CVA tenderness Genito Urinary  Vaginal cuff healed and in tact. Rectal  deferred Extremities  No bilateral cyanosis, clubbing or edema.   30 minutes of direct face to face counseling time was spent with the patient. This included discussion about prognosis, therapy recommendations and postoperative side effects and are beyond the scope of routine postoperative care.   Thereasa Solo, MD  04/15/2018, 3:37 PM

## 2018-04-14 NOTE — Telephone Encounter (Signed)
Called and spoke with the patient regarding her appt tomorrow. Patient has had no signs/symptoms, traveled or any exposure to COVID-19. Patient also has not been around anyone sick or that had any exposure. Explained that she will be asked the questions again tomorrow at the front desk; along with a temperature. Also explained the no visitor policy.

## 2018-04-14 NOTE — Progress Notes (Signed)
GYN Location of Tumor / Histology: FIGO grade 1 endometrioid endometrial cancer and severe symptomatic pelvic organ prolapse.  [place] presented with symptoms of: occasional bleeding symptoms since the beginning of November 2019.  She was on estradiol while using her pessary which had been placed in 2019 for severe pelvic organ prolapse.  She was evaluated by Dr. Nelda Marseille who performed a D&C on January 30, 2018.  This revealed well differentiated endometrial adenocarcinoma with foci approaching moderate differentiation.  Biopsies revealed: 03/19/18:  Diagnosis 1. Lymph node, sentinel, biopsy, right external iliac - ONE OF ONE LYMPH NODES NEGATIVE FOR CARCINOMA (0/1). 2. Lymph node, sentinel, biopsy, left external iliac - ONE OF ONE LYMPH NODES NEGATIVE FOR CARCINOMA (0/1). 3. Lymph node, sentinel, biopsy, left common iliac - ONE OF ONE LYMPH NODES NEGATIVE FOR CARCINOMA (0/1). 4. Uterus +/- tubes/ovaries, neoplastic, cervix, bilateral fallopian tubes and ovaries - UTERUS: -ENDOMYOMETRIUM: SUPERFICIALLY INVASIVE ENDOMETRIOID ADENOCARCINOMA, FIGO GRADE II, SPANNING 1.4 CM. TUMOR INVADES LESS THAN ONE HALF OF THE MYOMETRIUM. SEE ONCOLOGY TABLE. -SEROSA: UNREMARKABLE. NO MALIGNANCY. - CERVIX: BENIGN SQUAMOUS AND ENDOCERVICAL MUCOSA. NO DYSPLASIA OR MALIGNANCY. - BILATERAL OVARIES: UNREMARKABLE. NO MALIGNANCY. - BILATERAL FALLOPIAN TUBES: UNREMARKABLE. NO MALIGNANCY. Microscopic Comment 4. UTERUS, CARCINOMA OR CARCINOSARCOMA Procedure: Total hysterectomy with bilateral salpingo-oophorectomy. Bilateral external iliac and left common iliac sentinel lymph node biopsies. Histologic type: Endometrioid adenocarcinoma. Histologic Grade: FIGO grade II.   Past/Anticipated interventions by Gyn/Onc surgery, if any: 03/19/18:  Operation: Robotic-assisted laparoscopic total hysterectomy with bilateral salpingoophorectomy, SLN biopsy   Surgeon: Donaciano Eva  03/19/18:  Procedure: 1. Robotic  assisted laparoscopic sacrocolpopexy Surgeon: Ardis Hughs, MD  Past/Anticipated interventions by medical oncology, if any: None at this time.  Weight changes, if any:  Wt Readings from Last 3 Encounters:  04/15/18 128 lb (58.1 kg)  04/15/18 128 lb (58.1 kg)  03/22/18 139 lb 1.8 oz (63.1 kg)     Bowel/Bladder complaints, if any: pt denies dysuria/hematuria. Pt denies rectal bleeding, diarrhea/constipation. Pt denies incontinence.  Nausea/Vomiting, if any: Pt denies N/V, abdominal bloating  Pain issues, if any:  Pt denies c/o pain.  SAFETY ISSUES:  Prior radiation? No  Pacemaker/ICD? No  Possible current pregnancy? No  Is the patient on methotrexate? No  Current Complaints / other details:  Pt presents today for initial consult with Dr. Sondra Come for Radiation Oncology. Pt is unaccompanied.   Per Dr. Denman George 03/26/18:  Informed patient of stage IA grade 2 endometrioid endometrial cancer. Intermediate risk for recurrence given grade 2 and age > 38.  Recommend consideration for vaginal brachytherapy.  Would not start this before 8 weeks postop due to sacrocolpopexy.  Will set up appointment with Dr Sondra Come to discuss further.  Per Dr. 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  This is causing significant sequelae given that her actions are physically harmful.  An example of this would be the overconsumption of water that lead to dangerous hyponatremia.  Additionally the way she has managed her incisions postoperatively were also not as directed by staff but instead caused irritation to her overlying skin.  I discussed that I was concerned regarding her levels of anxiety and how this is dysfunctional for her postoperative recovery.  I explained how much of her difficult recovery, in fact all of her difficult recovery, was secondary to self-harm was induced secondary to actions led by anxiety.  I recommend consultation with psychiatry.  I  discussed this with Jakiya and she  declined.  She states that she is in a good place spiritually. She was applying alcohol to her incisions postoperatively as she was worried about them becoming infected. She was not instructed to do this. The glue came off due to this. She was then applying neosporin ointment. She has been drinking G0 gatorade as she was worried about drinking too much plain water. This gave her diarrhea.  She presents to clinic today wearing 3 x N95 respirator masks stacked on top of eachother with paper towels layered in between.   Thereasa Solo, MD  BP (!) 150/71 (BP Location: Left Arm, Patient Position: Sitting)   Pulse (!) 109   Temp 98.6 F (37 C) (Oral)   Resp 18   Ht 5' (1.524 m)   Wt 128 lb (58.1 kg)   SpO2 100%   BMI 25.00 kg/m   Wt Readings from Last 3 Encounters:  04/15/18 128 lb (58.1 kg)  04/15/18 128 lb (58.1 kg)  03/22/18 139 lb 1.8 oz (63.1 kg)   Loma Sousa, RN BSN

## 2018-04-15 ENCOUNTER — Inpatient Hospital Stay: Payer: Medicare HMO | Attending: Gynecologic Oncology

## 2018-04-15 ENCOUNTER — Encounter (INDEPENDENT_AMBULATORY_CARE_PROVIDER_SITE_OTHER): Payer: Self-pay

## 2018-04-15 ENCOUNTER — Inpatient Hospital Stay (HOSPITAL_BASED_OUTPATIENT_CLINIC_OR_DEPARTMENT_OTHER): Payer: Medicare HMO | Admitting: Gynecologic Oncology

## 2018-04-15 ENCOUNTER — Ambulatory Visit
Admission: RE | Admit: 2018-04-15 | Discharge: 2018-04-15 | Disposition: A | Discharge status: Home / Self Care | Payer: Medicare HMO | Source: Ambulatory Visit | Attending: Radiation Oncology | Admitting: Radiation Oncology

## 2018-04-15 ENCOUNTER — Encounter: Payer: Self-pay | Admitting: Gynecologic Oncology

## 2018-04-15 ENCOUNTER — Encounter: Payer: Self-pay | Admitting: Oncology

## 2018-04-15 ENCOUNTER — Encounter: Payer: Self-pay | Admitting: Radiation Oncology

## 2018-04-15 ENCOUNTER — Other Ambulatory Visit: Payer: Self-pay

## 2018-04-15 VITALS — BP 150/71 | HR 109 | Temp 98.6°F | Resp 18 | Ht 60.0 in | Wt 128.0 lb

## 2018-04-15 VITALS — BP 164/72 | HR 105 | Temp 98.7°F | Resp 20 | Ht 60.0 in | Wt 128.0 lb

## 2018-04-15 DIAGNOSIS — Z79899 Other long term (current) drug therapy: Secondary | ICD-10-CM | POA: Insufficient documentation

## 2018-04-15 DIAGNOSIS — Z7189 Other specified counseling: Secondary | ICD-10-CM

## 2018-04-15 DIAGNOSIS — Z90722 Acquired absence of ovaries, bilateral: Secondary | ICD-10-CM

## 2018-04-15 DIAGNOSIS — I129 Hypertensive chronic kidney disease with stage 1 through stage 4 chronic kidney disease, or unspecified chronic kidney disease: Secondary | ICD-10-CM | POA: Diagnosis not present

## 2018-04-15 DIAGNOSIS — Z90710 Acquired absence of both cervix and uterus: Secondary | ICD-10-CM | POA: Insufficient documentation

## 2018-04-15 DIAGNOSIS — Z7984 Long term (current) use of oral hypoglycemic drugs: Secondary | ICD-10-CM | POA: Insufficient documentation

## 2018-04-15 DIAGNOSIS — F419 Anxiety disorder, unspecified: Secondary | ICD-10-CM

## 2018-04-15 DIAGNOSIS — Z8249 Family history of ischemic heart disease and other diseases of the circulatory system: Secondary | ICD-10-CM | POA: Insufficient documentation

## 2018-04-15 DIAGNOSIS — C541 Malignant neoplasm of endometrium: Secondary | ICD-10-CM | POA: Insufficient documentation

## 2018-04-15 DIAGNOSIS — E1122 Type 2 diabetes mellitus with diabetic chronic kidney disease: Secondary | ICD-10-CM | POA: Diagnosis not present

## 2018-04-15 DIAGNOSIS — Z886 Allergy status to analgesic agent status: Secondary | ICD-10-CM | POA: Diagnosis not present

## 2018-04-15 DIAGNOSIS — R897 Abnormal histological findings in specimens from other organs, systems and tissues: Secondary | ICD-10-CM

## 2018-04-15 DIAGNOSIS — Z8042 Family history of malignant neoplasm of prostate: Secondary | ICD-10-CM | POA: Insufficient documentation

## 2018-04-15 DIAGNOSIS — E871 Hypo-osmolality and hyponatremia: Secondary | ICD-10-CM

## 2018-04-15 DIAGNOSIS — Z809 Family history of malignant neoplasm, unspecified: Secondary | ICD-10-CM | POA: Diagnosis not present

## 2018-04-15 DIAGNOSIS — Z90711 Acquired absence of uterus with remaining cervical stump: Secondary | ICD-10-CM | POA: Diagnosis not present

## 2018-04-15 DIAGNOSIS — N189 Chronic kidney disease, unspecified: Secondary | ICD-10-CM | POA: Insufficient documentation

## 2018-04-15 DIAGNOSIS — Z833 Family history of diabetes mellitus: Secondary | ICD-10-CM | POA: Insufficient documentation

## 2018-04-15 LAB — CBC
HCT: 32.6 % — ABNORMAL LOW (ref 36.0–46.0)
Hemoglobin: 10.7 g/dL — ABNORMAL LOW (ref 12.0–15.0)
MCH: 29.2 pg (ref 26.0–34.0)
MCHC: 32.8 g/dL (ref 30.0–36.0)
MCV: 88.8 fL (ref 80.0–100.0)
Platelets: 230 10*3/uL (ref 150–400)
RBC: 3.67 MIL/uL — ABNORMAL LOW (ref 3.87–5.11)
RDW: 14.5 % (ref 11.5–15.5)
WBC: 4.6 10*3/uL (ref 4.0–10.5)
nRBC: 0 % (ref 0.0–0.2)

## 2018-04-15 LAB — BASIC METABOLIC PANEL
Anion gap: 14 (ref 5–15)
BUN: 23 mg/dL (ref 8–23)
CO2: 21 mmol/L — ABNORMAL LOW (ref 22–32)
Calcium: 10 mg/dL (ref 8.9–10.3)
Chloride: 102 mmol/L (ref 98–111)
Creatinine, Ser: 1.54 mg/dL — ABNORMAL HIGH (ref 0.44–1.00)
GFR calc Af Amer: 39 mL/min — ABNORMAL LOW (ref >60)
GFR calc non Af Amer: 33 mL/min — ABNORMAL LOW (ref >60)
Glucose, Bld: 153 mg/dL — ABNORMAL HIGH (ref 70–99)
Potassium: 4.2 mmol/L (ref 3.5–5.1)
Sodium: 137 mmol/L (ref 135–145)

## 2018-04-15 NOTE — Patient Instructions (Signed)
We will plan to see you after completion of radiation.  Plan to only drink 8 cups of water a day.  No more neosporin on the incisions.  Keep the incisions clean and dry. No more use of wipes in the vagina.  We recommend the use of one mask at a time to keep a good seal. We placed a referral to genetics. Please call for any questions or concerns.

## 2018-04-15 NOTE — Progress Notes (Signed)
Radiation Oncology         [phone]) [phone] ________________________________  Initial Outpatient Consultation  Name: Jamie Macdonald MRN: [id number]  Date: 04/15/2018  DOB: April 23, 1945  CC:Leighton Ruff, MD  Everitt Amber, MD   REFERRING PHYSICIAN: Everitt Amber, MD  DIAGNOSIS: FIGO stage IA grade II endometrioid adenocarcinoma  HISTORY OF PRESENT ILLNESS::Jamie Macdonald is a 73 y.o. female who is presenting to the office today for evaluation of endometrial cancer. She saw Dr. Denman George earlier this afternoon who noted that the patient has a history of occasional bleeding symptoms since the beginning of November 2019.  She was on estradiol while using her pessary which had been placed in 2019 for severe pelvic organ prolapse.  She was evaluated by Dr. Nelda Marseille who performed a D&C on January 30, 2018.  This revealed well differentiated endometrial adenocarcinoma with foci approaching moderate differentiation. The patient has a history of severe pelvic organ prolapse that is symptomatic.  She saw Dr. [place] Pink in May 2018 who prescribed vaginal estrogen.  She then was seen back later in the year and recommended hysterectomy with a tacking procedure.  The patient was concerned about the use of vaginal mesh and therefore declined at this time.   She is here at the request of Dr. Denman George for radiation consideration.  The patient underwent a robotic TAH/BSO with Dr. Denman George and a robotic sacrocolpopexy with Dr. Louis Meckel on 03/19/2018. Pathology from this procedure revealed three negative lymph nodes, superficially invasive endometrioid adenocarcinoma FIGO grade II spanning 1.4 cm (with tumor invading less than half of the myometrium), benign cervical mucosa and unremarkable ovaries/fallopian tubes.    PREVIOUS RADIATION THERAPY: No  PAST MEDICAL HISTORY:  has a past medical history of Anemia, Cancer ([place]), Chronic kidney disease, Diabetes mellitus, Diabetes mellitus type 2 in nonobese ([place]) (01/16/2018), Essential  hypertension (01/16/2018), Exposure to hepatitis C, Exposure to TB (1973), Fatty tumor, Fibroids, Hypertension, Uterine prolapse (01/16/2018), and UTI (urinary tract infection).    PAST SURGICAL HISTORY: Past Surgical History:  Procedure Laterality Date  . CERVICAL CONING  74 OR 75  . DILATATION & CURETTAGE/HYSTEROSCOPY WITH MYOSURE N/A 01/30/2018   Procedure: DILATATION & CURETTAGE/HYSTEROSCOPY WITH MYOSURE;  Surgeon: Janyth Pupa, DO;  Location: [facility];  Service: Gynecology;  Laterality: N/A;  . ROBOTIC ASSISTED LAPAROSCOPIC SACROCOLPOPEXY N/A 03/19/2018   Procedure: XI ROBOTIC ASSISTED LAPAROSCOPIC SACROCOLPOPEXY;  Surgeon: Ardis Hughs, MD;  Location: [facility];  Service: Urology;  Laterality: N/A;  . ROBOTIC ASSISTED TOTAL HYSTERECTOMY WITH BILATERAL SALPINGO OOPHERECTOMY Bilateral 03/19/2018   Procedure: XI ROBOTIC ASSISTED TOTAL HYSTERECTOMY WITH BILATERAL SALPINGO OOPHORECTOMY;  Surgeon: Everitt Amber, MD;  Location: [facility];  Service: Gynecology;  Laterality: Bilateral;  . SENTINEL NODE BIOPSY Bilateral 03/19/2018   Procedure: SENTINEL NODE BIOPSY;  Surgeon: Everitt Amber, MD;  Location: [facility];  Service: Gynecology;  Laterality: Bilateral;    FAMILY HISTORY: family history includes Cancer in her sister; Diabetes in her father and sister; Heart disease in her father; Parkinson's disease in her mother; Prostate cancer in her father; Stroke in her mother.  SOCIAL HISTORY:  reports that she has never smoked. She has never used smokeless tobacco. She reports that she does not drink alcohol or use drugs.  ALLERGIES: Nsaids  MEDICATIONS:  Current Outpatient Medications  Medication Sig Dispense Refill  . ACCU-CHEK AVIVA PLUS test strip     . Accu-Chek Softclix Lancets lancets     . Blood Glucose Monitoring Suppl (ACCU-CHEK AVIVA PLUS) w/Device KIT     .  Cholecalciferol (VITAMIN D) 50 MCG (2000 UT) tablet Take 2,000 Units by mouth daily.    . Flax Oil-Fish Oil-Borage Oil (FISH  OIL-FLAX OIL-BORAGE OIL PO) Take 1 capsule by mouth daily.     Marland Kitchen lisinopril (PRINIVIL,ZESTRIL) 10 MG tablet Take 10 mg by mouth daily.    . metFORMIN (GLUCOPHAGE) 500 MG tablet Take 500 mg by mouth 2 (two) times daily with a meal.     . Multiple Vitamins-Minerals (MULTIVITAMIN ADULT PO) Take 1 tablet by mouth daily.     Marland Kitchen estradiol (ESTRACE) 0.1 MG/GM vaginal cream Insert a pea size amount in the vagina at bedtime every other night for 6 weeks (Patient not taking: Reported on 04/15/2018) 42.5 g 1   No current facility-administered medications for this encounter.     REVIEW OF SYSTEMS:  A 10+ POINT REVIEW OF SYSTEMS WAS OBTAINED including neurology, dermatology, psychiatry, cardiac, respiratory, lymph, extremities, GI, GU, musculoskeletal, constitutional, reproductive, HEENT. All pertinent positives are noted in the HPI. All others are negative.    PHYSICAL EXAM:  height is 5' (1.524 m) and weight is 128 lb (58.1 kg). Her oral temperature is 98.6 F (37 C). Her blood pressure is 150/71 (abnormal) and her pulse is 109 (abnormal). Her respiration is 18 and oxygen saturation is 100%.   General: Alert and oriented, in no acute distress HEENT: Head is normocephalic. Extraocular movements are intact. Oropharynx is clear. Neck: Neck is supple, no palpable cervical or supraclavicular lymphadenopathy. Heart: Regular in rate and rhythm with no murmurs, rubs, or gallops. Chest: Clear to auscultation bilaterally, with no rhonchi, wheezes, or rales. Abdomen: Soft, nontender, nondistended, with no rigidity or guarding. Extremities: No cyanosis or edema. Lymphatics: see Neck Exam Skin: No concerning lesions. Musculoskeletal: symmetric strength and muscle tone throughout. Neurologic: Cranial nerves II through XII are grossly intact. No obvious focalities. Speech is fluent. Coordination is intact. Psychiatric: Judgment and insight are intact. Affect is appropriate.  Anxious concerning the Covid 19 situation is  wearing 3 masks as well as gloves Pelvic exam deferred. Pelvic exam per Dr. Denman George earlier today showed the vaginal cuff to be healing well.   ECOG = 1  0 - Asymptomatic (Fully active, able to carry on all predisease activities without restriction)  1 - Symptomatic but completely ambulatory (Restricted in physically strenuous activity but ambulatory and able to carry out work of a light or sedentary nature. For example, light housework, office work)  2 - Symptomatic, <50% in bed during the day (Ambulatory and capable of all self care but unable to carry out any work activities. Up and about more than 50% of waking hours)  3 - Symptomatic, >50% in bed, but not bedbound (Capable of only limited self-care, confined to bed or chair 50% or more of waking hours)  4 - Bedbound (Completely disabled. Cannot carry on any self-care. Totally confined to bed or chair)  5 - Death   Eustace Pen MM, Creech RH, Tormey DC, et al. [phone]). "Toxicity and response criteria of the [facility]". [place] Oncol. 5 (6): 649-55  LABORATORY DATA:  Lab Results  Component Value Date   WBC 4.6 04/15/2018   HGB 10.7 (L) 04/15/2018   HCT 32.6 (L) 04/15/2018   MCV 88.8 04/15/2018   PLT 230 04/15/2018   NEUTROABS 5.4 03/26/2018   Lab Results  Component Value Date   NA 137 04/15/2018   K 4.2 04/15/2018   CL 102 04/15/2018   CO2 21 (L) 04/15/2018   GLUCOSE  153 (H) 04/15/2018   CREATININE 1.54 (H) 04/15/2018   CALCIUM 10.0 04/15/2018     RADIOGRAPHY: No results found.    IMPRESSION: FIGO stage IA grade II endometrioid adenocarcinoma   Patient would be at risk for vaginal recurrence and would be considered high intermediate risk with grade II lesion and her age over 46. In this situation would recommend vaginal brachytherapy directed at the vaginal cuff to reduce chances for recurrence.  Today, I talked to the patient about the findings and work-up thus far.  We discussed the natural  history of her cancer and general treatment, highlighting the role of radiotherapy (brachytherapy) in the management.  We discussed the available radiation techniques, and focused on the details of logistics and delivery.  We reviewed the anticipated acute and late sequelae associated with radiation in this setting.  The patient was encouraged to ask questions that I answered to the best of my ability.  A patient consent form was discussed and signed.  We retained a copy for our records.  The patient would like to proceed with radiation and will be scheduled for CT simulation.   PLAN: Pt will be scheduled for vaginal brachytherapy using Iridium-192 with treatments to begin approximately 6 weeks post-op.  Anticipate 5 HDR treatments.   ------------------------------------------------  Blair Promise, PhD, MD      This document serves as a record of services personally performed by Gery Pray, MD. It was created on his behalf by Mary-Margaret Loma Messing, a trained medical scribe. The creation of this record is based on the scribe's personal observations and the provider's statements to them. This document has been checked and approved by the attending provider.

## 2018-04-15 NOTE — Patient Instructions (Signed)
Coronavirus (COVID-19) Are you at risk?  Are you at risk for the Coronavirus (COVID-19)?  To be considered HIGH RISK for Coronavirus (COVID-19), you have to meet the following criteria:  . Traveled to [place], [place], [place], [place] or [place]; or in the [place] to [place], [place], [place], or [place]; and have fever, cough, and shortness of breath within the last 2 weeks of travel OR . Been in close contact with a person diagnosed with COVID-19 within the last 2 weeks and have fever, cough, and shortness of breath . IF YOU DO NOT MEET THESE CRITERIA, YOU ARE CONSIDERED LOW RISK FOR COVID-19.  What to do if you are HIGH RISK for COVID-19?  . If you are having a medical emergency, call 911. . Seek medical care right away. Before you go to a doctor's office, urgent care or emergency department, call ahead and tell them about your recent travel, contact with someone diagnosed with COVID-19, and your symptoms. You should receive instructions from your physician's office regarding next steps of care.  . When you arrive at healthcare provider, tell the healthcare staff immediately you have returned from visiting [place], [place], [place], [place] or [place]; or traveled in the [place] to [place], [place], [place], or [place]; in the last two weeks or you have been in close contact with a person diagnosed with COVID-19 in the last 2 weeks.   . Tell the health care staff about your symptoms: fever, cough and shortness of breath. . After you have been seen by a medical provider, you will be either: o Tested for (COVID-19) and discharged home on quarantine except to seek medical care if symptoms worsen, and asked to  - Stay home and avoid contact with others until you get your results (4-5 days)  - Avoid travel on public transportation if possible (such as bus, train, or airplane) or o Sent to the Emergency Department by EMS for evaluation, COVID-19 testing, and possible  admission depending on your condition and test results.  What to do if you are LOW RISK for COVID-19?  Reduce your risk of any infection by using the same precautions used for avoiding the common cold or flu:  . Wash your hands often with soap and warm water for at least 20 seconds.  If soap and water are not readily available, use an alcohol-based hand sanitizer with at least 60% alcohol.  . If coughing or sneezing, cover your mouth and nose by coughing or sneezing into the elbow areas of your shirt or coat, into a tissue or into your sleeve (not your hands). . Avoid shaking hands with others and consider head nods or verbal greetings only. . Avoid touching your eyes, nose, or mouth with unwashed hands.  . Avoid close contact with people who are sick. . Avoid places or events with large numbers of people in one location, like concerts or sporting events. . Carefully consider travel plans you have or are making. . If you are planning any travel outside or inside the [place], visit the CDC's Travelers' Health webpage for the latest health notices. . If you have some symptoms but not all symptoms, continue to monitor at home and seek medical attention if your symptoms worsen. . If you are having a medical emergency, call 911.   ADDITIONAL HEALTHCARE OPTIONS FOR PATIENTS  [facility] / e-Visit: [email or web address]         [facility]: [phone]  [facility]: [phone]                   [facility]: [phone]   

## 2018-04-17 ENCOUNTER — Telehealth: Payer: Self-pay | Admitting: Oncology

## 2018-04-17 ENCOUNTER — Ambulatory Visit: Payer: Medicare HMO | Admitting: Gynecologic Oncology

## 2018-04-17 NOTE — Telephone Encounter (Signed)
Called Jamie Macdonald back and advised her to call Dr. Carlton Adam office regarding estrace to see if she still needs to start it.  Also discussed not taking a bath until at least 8 weeks past surgery due to increased risk of infection.  Advised her to take showers instead.

## 2018-04-17 NOTE — Telephone Encounter (Signed)
Jamie Macdonald called and said she forgot to ask Dr. Denman George if she should be using estrace cream.  She also asked if she can use natural estrogen in it's place.  She is wondering if she can take a bath. She said she has been bleeding since Thursday although it is less today.  She noticed the bleeding when wiping.  Advised her that the bleeding is most likely from the exam on Wednesday.  Also discussed wound care and that she should not apply anything to her incisions, just keep them clean and dry.  She verbalized understanding and agreement.

## 2018-04-20 NOTE — Telephone Encounter (Addendum)
Jamie Macdonald called and wanted to review what we had discussed on Friday.  Advised her that she should contact Dr. Carlton Adam office regarding using Estrace cream.  She said she has called his office.  Also discussed not taking a bath for 8 weeks after surgery.  She verbalized agreement.  She also asked if I fill out MOST forms.  Advised her to bring it to her next appointment with Dr. Sondra Come and we will figure out who needs to complete it.

## 2018-04-21 ENCOUNTER — Telehealth: Payer: Self-pay | Admitting: *Deleted

## 2018-04-21 ENCOUNTER — Telehealth: Payer: Self-pay

## 2018-04-21 NOTE — Telephone Encounter (Signed)
CALLED PATIENT TO INFORM OF NEW HDR [facility], SPOKE WITH PATIENT AND SHE IS AWARE OF THESE APPTS. °

## 2018-04-21 NOTE — Telephone Encounter (Signed)
Pt calling with question re: estradiol vs vitamin E oil for vaginal use. Conveyed to pt that it was best to contact prescribing physician with those questions. Pt verbalized understanding. Loma Sousa, RN BSN

## 2018-04-29 ENCOUNTER — Telehealth: Payer: Self-pay | Admitting: *Deleted

## 2018-04-29 ENCOUNTER — Telehealth: Payer: Self-pay | Admitting: Gynecologic Oncology

## 2018-04-29 NOTE — Telephone Encounter (Signed)
Patient called and requested to speak with [facility] NP. Patient stated  "I has spoke with someone last week about spotting. This week it has gotten heavier and red. I'm having no pain or fever. Changing pads several times a day; but it's to feel clean." Explained that I would give Melissa NP the message and someone from the office would call her back.

## 2018-04-29 NOTE — Telephone Encounter (Signed)
Returned call to patient.  She states "She is my doctor and I want to have a good relationship with her. I don't know what I missed and I wanted answer to my health."  All questions answered.  25 minutes spent on the phone with patient. She reports spotting and weight loss. Advised to monitor the spotting and call if it increases.

## 2018-05-05 ENCOUNTER — Telehealth: Payer: Self-pay

## 2018-05-05 NOTE — Telephone Encounter (Signed)
Pt calling re: multiple questions for appt on Thursday for new [facility] HDR with Dr. Sondra Come. This RN explained to pt what to expect and time frame for first day that includes nurse eval, MD appt, CT sim, break for planning purposes, and HDR treatment. Pt ruminating re: instructions on "What to expect for CT Simulation". Attempted to redirect pt and convey to her that sheet was not relevant to her upcoming appts/procedures on Thursday. Pt also conveyed she is "bleeding" and is concerned about having procedure with vaginal bleeding. Conveyed to pt that staff would provide peri pads if needed and washcloths for cleansing. Pt asked, "So I have two cancers in my vagina?". Conveyed to pt that she would need to speak with a physician regarding her health and diagnosis.  Pt abruptly without further questions. This RN inquired if pt had any further questions/concerns. Pt denied further questions/concerns. Loma Sousa, RN BSN

## 2018-05-06 ENCOUNTER — Telehealth: Payer: Self-pay | Admitting: *Deleted

## 2018-05-06 NOTE — Telephone Encounter (Signed)
Called patient to remind of New HDR [facility] for 05-07-18, spoke with patient and she is aware of these appts

## 2018-05-07 ENCOUNTER — Ambulatory Visit
Admission: RE | Admit: 2018-05-07 | Discharge: 2018-05-07 | Disposition: A | Discharge status: Home / Self Care | Payer: Medicare HMO | Source: Ambulatory Visit | Attending: Radiation Oncology | Admitting: Radiation Oncology

## 2018-05-07 ENCOUNTER — Encounter: Payer: Self-pay | Admitting: Radiation Oncology

## 2018-05-07 ENCOUNTER — Other Ambulatory Visit: Payer: Self-pay

## 2018-05-07 VITALS — BP 149/81 | HR 90 | Temp 97.6°F | Resp 18 | Ht 60.0 in | Wt 125.2 lb

## 2018-05-07 DIAGNOSIS — E1122 Type 2 diabetes mellitus with diabetic chronic kidney disease: Secondary | ICD-10-CM | POA: Diagnosis not present

## 2018-05-07 DIAGNOSIS — Z79899 Other long term (current) drug therapy: Secondary | ICD-10-CM | POA: Insufficient documentation

## 2018-05-07 DIAGNOSIS — Z7984 Long term (current) use of oral hypoglycemic drugs: Secondary | ICD-10-CM | POA: Diagnosis not present

## 2018-05-07 DIAGNOSIS — M533 Sacrococcygeal disorders, not elsewhere classified: Secondary | ICD-10-CM | POA: Diagnosis not present

## 2018-05-07 DIAGNOSIS — C541 Malignant neoplasm of endometrium: Secondary | ICD-10-CM

## 2018-05-07 DIAGNOSIS — Z886 Allergy status to analgesic agent status: Secondary | ICD-10-CM | POA: Diagnosis not present

## 2018-05-07 DIAGNOSIS — R350 Frequency of micturition: Secondary | ICD-10-CM | POA: Diagnosis not present

## 2018-05-07 DIAGNOSIS — Z90710 Acquired absence of both cervix and uterus: Secondary | ICD-10-CM | POA: Diagnosis not present

## 2018-05-07 DIAGNOSIS — N189 Chronic kidney disease, unspecified: Secondary | ICD-10-CM | POA: Diagnosis not present

## 2018-05-07 DIAGNOSIS — Z8249 Family history of ischemic heart disease and other diseases of the circulatory system: Secondary | ICD-10-CM | POA: Diagnosis not present

## 2018-05-07 DIAGNOSIS — I129 Hypertensive chronic kidney disease with stage 1 through stage 4 chronic kidney disease, or unspecified chronic kidney disease: Secondary | ICD-10-CM | POA: Diagnosis not present

## 2018-05-07 NOTE — Progress Notes (Signed)
  Radiation Oncology         [phone] ________________________________  Name: Jamie Macdonald MRN: [id number]  Date: 05/07/2018  DOB: [birth year]  SIMULATION AND TREATMENT PLANNING NOTE HDR BRACHYTHERAPY  DIAGNOSIS:  73 y.o. female with FIGO stage IA grade II endometrioid adenocarcinoma  NARRATIVE:  The patient was brought to the [facility] suite.  Identity was confirmed.  All relevant records and images related to the planned course of therapy were reviewed.  The patient freely provided informed written consent to proceed with treatment after reviewing the details related to the planned course of therapy. The consent form was witnessed and verified by the simulation staff.  Then, the patient was set-up in a stable reproducible  supine position for radiation therapy.  CT images were obtained.  Surface markings were placed.  The CT images were loaded into the planning software.  Then the target and avoidance structures were contoured.  Treatment planning then occurred.  The radiation prescription was entered and confirmed.   I have requested : Brachytherapy Isodose Plan and Dosimetry Calculations to plan the radiation distribution.     Document Information  Photos    05/07/2018 09:15  Attached To:  Hospital Encounter on 05/07/18 with Gery Pray, MD  Source Information  Gery Pray, MD  [facility]-Radiation Onc     CT images show thickening around the vaginal vault consistent with mesh placement   PLAN:  The patient will receive 30 Gy in 5 fractions (6 Gy per fraction). Iridium-192 will be the high-dose-rate source. The patient will be treated with a 3 cm diameter cylinder with a treatment length of 3 cm.  Prescription will be to the mucosal surface.    ________________________________  Blair Promise, PhD, MD  This document serves as a record of services personally performed by Gery Pray, MD. It was created on his behalf by Rae Lips, a trained medical scribe.  The creation of this record is based on the scribe's personal observations and the provider's statements to them. This document has been checked and approved by the attending provider.

## 2018-05-07 NOTE — Progress Notes (Signed)
  Radiation Oncology         [phone] ________________________________  Name: Jamie Macdonald MRN: [id number]  Date: 05/07/2018  DOB: 08-Sep-1945  CC: Leighton Ruff, MD  Leighton Ruff, MD  HDR BRACHYTHERAPY NOTE  DIAGNOSIS: 73 y.o. female with FIGO stage IA grade II endometrioid adenocarcinoma   Simple treatment device note: Patient had construction of her custom vaginal cylinder. She will be treated with a 3.0 cm diameter segmented cylinder. This conforms to her anatomy without undue discomfort.  Vaginal brachytherapy procedure node: The patient was brought to the [place] suite. Identity was confirmed. All relevant records and images related to the planned course of therapy were reviewed. The patient freely provided informed written consent to proceed with treatment after reviewing the details related to the planned course of therapy. The consent form was witnessed and verified by the simulation staff. Then, the patient was set-up in a stable reproducible supine position for radiation therapy. Pelvic exam revealed the vaginal cuff to be intact. The patient's custom vaginal cylinder was placed in the proximal vagina. This was affixed to the CT/MR stabilization plate to prevent slippage. Patient tolerated the placement well.  Verification simulation note:  A fiducial marker was placed within the vaginal cylinder. An AP and lateral film was then obtained through the pelvis area. This documented accurate position of the vaginal cylinder for treatment.  HDR BRACHYTHERAPY TREATMENT  The remote afterloading device was affixed to the vaginal cylinder by catheter. Patient then proceeded to undergo her first high-dose-rate treatment directed at the proximal vagina. The patient was prescribed a dose of 6.0 gray to be delivered to the mucosal surface. Treatment length was 3.0 cm. Patient was treated with 1 channel using 7 dwell positions. Treatment time was 196.7 seconds. Iridium 192 was the  high-dose-rate source for treatment. The patient tolerated the treatment well. After completion of her therapy, a radiation survey was performed documenting return of the iridium source into the [employer] safe.   PLAN: The patient will return on 05/14/2018 for her second HDR treatment.  ________________________________  Blair Promise, PhD, MD   This document serves as a record of services personally performed by Gery Pray, MD. It was created on his behalf by Rae Lips, a trained medical scribe. The creation of this record is based on the scribe's personal observations and the provider's statements to them. This document has been checked and approved by the attending provider.

## 2018-05-07 NOTE — Progress Notes (Signed)
Pt presents today for new [place] HDR. Pt is unaccompanied. Pt denies c/o pain at this time but does state she has been having pain "in the tailbone where they attached all that". Pt reports increased urinary frequency. Pt denies dysuria/hematuria. Pt reports vaginal bleeding, with what is described as heavy spotting. Pt denies rectal bleeding, diarrhea/constipation. Pt talkative and anxious. Pt ruminating.  BP (!) 149/81 (BP Location: Right Arm, Patient Position: Sitting)   Pulse 90   Temp 97.6 F (36.4 C) (Oral)   Resp 18   Ht 5' (1.524 m)   Wt 125 lb 3.2 oz (56.8 kg)   BMI 24.45 kg/m   Wt Readings from Last 3 Encounters:  05/07/18 125 lb 3.2 oz (56.8 kg)  04/15/18 128 lb (58.1 kg)  04/15/18 128 lb (58.1 kg)   Loma Sousa, RN BSN

## 2018-05-07 NOTE — Progress Notes (Signed)
Radiation Oncology         [phone] ________________________________  Name: Jamie Macdonald MRN: [id number]  Date: 05/07/2018  DOB: [birth year]  Vaginal Brachytherapy Procedure Note  CC: Leighton Ruff, MD Leighton Ruff, MD    ICD-10-CM   1. Endometrial cancer (HCC) C54.1     Diagnosis: 73 y.o. female with FIGO stage IA grade II endometrioid adenocarcinoma  Narrative: She returns today for vaginal cylinder fitting.   On review of systems, the patient states that she has been having pain "in the tailbone where they attached all that". She reports increased urinary frequency. She denies dysuria or hematuria. She reports vaginal bleeding, with what is described as heavy spotting. She denies rectal bleeding, diarrhea, or constipation. She is talkative and anxious today.  ALLERGIES: is allergic to nsaids.  Meds: Current Outpatient Medications  Medication Sig Dispense Refill  . ACCU-CHEK AVIVA PLUS test strip     . Accu-Chek Softclix Lancets lancets     . Blood Glucose Monitoring Suppl (ACCU-CHEK AVIVA PLUS) w/Device KIT     . Cholecalciferol (VITAMIN D) 50 MCG (2000 UT) tablet Take 2,000 Units by mouth daily.    . Flax Oil-Fish Oil-Borage Oil (FISH OIL-FLAX OIL-BORAGE OIL PO) Take 1 capsule by mouth daily.     Marland Kitchen lisinopril (PRINIVIL,ZESTRIL) 10 MG tablet Take 10 mg by mouth daily.    . metFORMIN (GLUCOPHAGE) 500 MG tablet Take 500 mg by mouth 2 (two) times daily with a meal.     . Multiple Vitamins-Minerals (MULTIVITAMIN ADULT PO) Take 1 tablet by mouth daily.     Marland Kitchen estradiol (ESTRACE) 0.1 MG/GM vaginal cream Insert a pea size amount in the vagina at bedtime every other night for 6 weeks (Patient not taking: Reported on 04/15/2018) 42.5 g 1   No current facility-administered medications for this encounter.     Physical Findings: The patient is in no acute distress. Patient is alert and oriented.  height is 5' (1.524 m) and weight is 125 lb 3.2 oz (56.8 kg). Her oral  temperature is 97.6 F (36.4 C). Her blood pressure is 149/81 (abnormal) and her pulse is 90. Her respiration is 18.   No palpable cervical, supraclavicular or axillary lymphoadenopathy. The heart has a regular rate and rhythm. The lungs are clear to auscultation. Abdomen soft and non-tender.  On pelvic examination the external genitalia were unremarkable. A speculum exam was performed. Upon placing the speculum, the patient was noted to have dark brown blood in the proximal vagina which soaked a foxx swab. Vaginal cuff intact, no mucosal lesions. On bimanual exam there were no pelvic masses appreciated.  Lab Findings: Lab Results  Component Value Date   WBC 4.6 04/15/2018   HGB 10.7 (L) 04/15/2018   HCT 32.6 (L) 04/15/2018   MCV 88.8 04/15/2018   PLT 230 04/15/2018    Radiographic Findings: No results found.  Impression: FIGO stage IA grade II endometrioid adenocarcinoma  Phone discussion with Dr. Denman George concerning the dark brown blood was initiated. Given the patient's placement of the mesh during surgical procedure, she would be expected to have some of this bleeding. Patient was examined by Dr. Denman George several days ago, and the cuff was noted to be intact without problems. Felt safe to proceed with vaginal brachytherapy today.  Patient was fitted for a vaginal cylinder. The patient will be treated with a 3 cm diameter cylinder with a treatment length of 3 cm. This distended the vaginal vault without undue discomfort. The patient tolerated the  procedure well.  The patient was successfully fitted for a vaginal cylinder. The patient is appropriate to begin vaginal brachytherapy.   Plan: The patient will proceed with CT simulation and vaginal brachytherapy today.    _______________________________   Blair Promise, PhD, MD  This document serves as a record of services personally performed by Gery Pray, MD. It was created on his behalf by Rae Lips, a trained medical scribe.  The creation of this record is based on the scribe's personal observations and the provider's statements to them. This document has been checked and approved by the attending provider.

## 2018-05-07 NOTE — Patient Instructions (Signed)
Coronavirus (COVID-19) Are you at risk?  Are you at risk for the Coronavirus (COVID-19)?  To be considered HIGH RISK for Coronavirus (COVID-19), you have to meet the following criteria:  . Traveled to [place], [place], [place], [place] or [place]; or in the [place] to [place], [place], [place], or [place]; and have fever, cough, and shortness of breath within the last 2 weeks of travel OR . Been in close contact with a person diagnosed with COVID-19 within the last 2 weeks and have fever, cough, and shortness of breath . IF YOU DO NOT MEET THESE CRITERIA, YOU ARE CONSIDERED LOW RISK FOR COVID-19.  What to do if you are HIGH RISK for COVID-19?  . If you are having a medical emergency, call 911. . Seek medical care right away. Before you go to a doctor's office, urgent care or emergency department, call ahead and tell them about your recent travel, contact with someone diagnosed with COVID-19, and your symptoms. You should receive instructions from your physician's office regarding next steps of care.  . When you arrive at healthcare provider, tell the healthcare staff immediately you have returned from visiting [place], [place], [place], [place] or [place]; or traveled in the [place] to [place], [place], [place], or [place]; in the last two weeks or you have been in close contact with a person diagnosed with COVID-19 in the last 2 weeks.   . Tell the health care staff about your symptoms: fever, cough and shortness of breath. . After you have been seen by a medical provider, you will be either: o Tested for (COVID-19) and discharged home on quarantine except to seek medical care if symptoms worsen, and asked to  - Stay home and avoid contact with others until you get your results (4-5 days)  - Avoid travel on public transportation if possible (such as bus, train, or airplane) or o Sent to the Emergency Department by EMS for evaluation, COVID-19 testing, and possible  admission depending on your condition and test results.  What to do if you are LOW RISK for COVID-19?  Reduce your risk of any infection by using the same precautions used for avoiding the common cold or flu:  . Wash your hands often with soap and warm water for at least 20 seconds.  If soap and water are not readily available, use an alcohol-based hand sanitizer with at least 60% alcohol.  . If coughing or sneezing, cover your mouth and nose by coughing or sneezing into the elbow areas of your shirt or coat, into a tissue or into your sleeve (not your hands). . Avoid shaking hands with others and consider head nods or verbal greetings only. . Avoid touching your eyes, nose, or mouth with unwashed hands.  . Avoid close contact with people who are sick. . Avoid places or events with large numbers of people in one location, like concerts or sporting events. . Carefully consider travel plans you have or are making. . If you are planning any travel outside or inside the [place], visit the CDC's Travelers' Health webpage for the latest health notices. . If you have some symptoms but not all symptoms, continue to monitor at home and seek medical attention if your symptoms worsen. . If you are having a medical emergency, call 911.   ADDITIONAL HEALTHCARE OPTIONS FOR PATIENTS  [facility] / e-Visit: [email or web address]         [facility]: [phone]  [facility]: [phone]                   [facility]: [phone]   

## 2018-05-13 ENCOUNTER — Telehealth: Payer: Self-pay | Admitting: *Deleted

## 2018-05-13 NOTE — Telephone Encounter (Signed)
Called patient to remind of HDR Tx. for 05-14-18 @ 1 pm, spoke with patient and she is aware of this appt.

## 2018-05-14 ENCOUNTER — Other Ambulatory Visit: Payer: Self-pay

## 2018-05-14 ENCOUNTER — Ambulatory Visit
Admission: RE | Admit: 2018-05-14 | Discharge: 2018-05-14 | Disposition: A | Discharge status: Home / Self Care | Payer: Medicare HMO | Source: Ambulatory Visit | Attending: Radiation Oncology | Admitting: Radiation Oncology

## 2018-05-15 ENCOUNTER — Telehealth: Payer: Self-pay | Admitting: *Deleted

## 2018-05-15 NOTE — Telephone Encounter (Signed)
CALLED PATIENT TO REMIND OF HDR TX. FOR 05-18-18 @ 11 AM, SPOKE WITH PATIENT AND SHE IS AWARE OF THIS [place].

## 2018-05-18 ENCOUNTER — Ambulatory Visit
Admission: RE | Admit: 2018-05-18 | Discharge: 2018-05-18 | Disposition: A | Discharge status: Home / Self Care | Payer: Medicare HMO | Source: Ambulatory Visit | Attending: Radiation Oncology | Admitting: Radiation Oncology

## 2018-05-18 ENCOUNTER — Other Ambulatory Visit: Payer: Self-pay

## 2018-05-18 DIAGNOSIS — Z7984 Long term (current) use of oral hypoglycemic drugs: Secondary | ICD-10-CM | POA: Diagnosis not present

## 2018-05-18 DIAGNOSIS — Z833 Family history of diabetes mellitus: Secondary | ICD-10-CM | POA: Diagnosis not present

## 2018-05-18 DIAGNOSIS — Z886 Allergy status to analgesic agent status: Secondary | ICD-10-CM | POA: Insufficient documentation

## 2018-05-18 DIAGNOSIS — Z8042 Family history of malignant neoplasm of prostate: Secondary | ICD-10-CM | POA: Insufficient documentation

## 2018-05-18 DIAGNOSIS — Z8249 Family history of ischemic heart disease and other diseases of the circulatory system: Secondary | ICD-10-CM | POA: Insufficient documentation

## 2018-05-18 DIAGNOSIS — E1122 Type 2 diabetes mellitus with diabetic chronic kidney disease: Secondary | ICD-10-CM | POA: Diagnosis not present

## 2018-05-18 DIAGNOSIS — C541 Malignant neoplasm of endometrium: Secondary | ICD-10-CM | POA: Diagnosis not present

## 2018-05-18 DIAGNOSIS — Z79899 Other long term (current) drug therapy: Secondary | ICD-10-CM | POA: Insufficient documentation

## 2018-05-18 DIAGNOSIS — Z90710 Acquired absence of both cervix and uterus: Secondary | ICD-10-CM | POA: Insufficient documentation

## 2018-05-18 DIAGNOSIS — N189 Chronic kidney disease, unspecified: Secondary | ICD-10-CM | POA: Insufficient documentation

## 2018-05-18 DIAGNOSIS — Z809 Family history of malignant neoplasm, unspecified: Secondary | ICD-10-CM | POA: Diagnosis not present

## 2018-05-18 DIAGNOSIS — I129 Hypertensive chronic kidney disease with stage 1 through stage 4 chronic kidney disease, or unspecified chronic kidney disease: Secondary | ICD-10-CM | POA: Insufficient documentation

## 2018-05-18 NOTE — Progress Notes (Signed)
  Radiation Oncology         (336) [phone] ________________________________  Name: Jamie Macdonald MRN: [id number]  Date: 05/18/2018  DOB: [birth year]  CC: Leighton Ruff, MD  Leighton Ruff, MD  HDR BRACHYTHERAPY NOTE  DIAGNOSIS: 73 y.o. female with FIGO stage IA grade II endometrioid adenocarcinoma   Simple treatment device note: Patient had construction of her custom vaginal cylinder. She will be treated with a 3.0 cm diameter segmented cylinder. This conforms to her anatomy without undue discomfort.  Vaginal brachytherapy procedure node: The patient was brought to the [place] suite. Identity was confirmed. All relevant records and images related to the planned course of therapy were reviewed. The patient freely provided informed written consent to proceed with treatment after reviewing the details related to the planned course of therapy. The consent form was witnessed and verified by the simulation staff. Then, the patient was set-up in a stable reproducible supine position for radiation therapy. Pelvic exam revealed the vaginal cuff to be intact. The patient's custom vaginal cylinder was placed in the proximal vagina. This was affixed to the CT/MR stabilization plate to prevent slippage. Patient tolerated the placement well.  Verification simulation note:  A fiducial marker was placed within the vaginal cylinder. An AP and lateral film was then obtained through the pelvis area. This documented accurate position of the vaginal cylinder for treatment.  HDR BRACHYTHERAPY TREATMENT  The remote afterloading device was affixed to the vaginal cylinder by catheter. Patient then proceeded to undergo her second high-dose-rate treatment directed at the proximal vagina. The patient was prescribed a dose of 6.0 gray to be delivered to the mucosal surface. Treatment length was 3.0 cm. Patient was treated with 1 channel using 7 dwell positions. Treatment time was 218.2 seconds. Iridium 192 was the  high-dose-rate source for treatment. The patient tolerated the treatment well. After completion of her therapy, a radiation survey was performed documenting return of the iridium source into the [employer] safe.   PLAN: The patient will return on 05/21/2018 for her third HDR treatment.  ________________________________  Blair Promise, PhD, MD   This document serves as a record of services personally performed by Gery Pray, MD. It was created on his behalf by Mary-Margaret, a trained medical scribe. The creation of this record is based on the scribe's personal observations and the provider's statements to them. This document has been checked and approved by the attending provider.

## 2018-05-20 ENCOUNTER — Telehealth: Payer: Self-pay | Admitting: *Deleted

## 2018-05-20 NOTE — Telephone Encounter (Signed)
Called patient to remind of HDR Tx. for 05-21-18 @ 1 pm, spoke with patient and she is aware of this tx.

## 2018-05-21 ENCOUNTER — Ambulatory Visit
Admission: RE | Admit: 2018-05-21 | Discharge: 2018-05-21 | Disposition: A | Discharge status: Home / Self Care | Payer: Medicare HMO | Source: Ambulatory Visit | Attending: Radiation Oncology | Admitting: Radiation Oncology

## 2018-05-21 ENCOUNTER — Other Ambulatory Visit: Payer: Self-pay

## 2018-05-21 DIAGNOSIS — Z7984 Long term (current) use of oral hypoglycemic drugs: Secondary | ICD-10-CM | POA: Diagnosis not present

## 2018-05-21 DIAGNOSIS — E1122 Type 2 diabetes mellitus with diabetic chronic kidney disease: Secondary | ICD-10-CM | POA: Diagnosis not present

## 2018-05-21 DIAGNOSIS — N189 Chronic kidney disease, unspecified: Secondary | ICD-10-CM | POA: Diagnosis not present

## 2018-05-21 DIAGNOSIS — C541 Malignant neoplasm of endometrium: Secondary | ICD-10-CM

## 2018-05-21 DIAGNOSIS — Z886 Allergy status to analgesic agent status: Secondary | ICD-10-CM | POA: Diagnosis not present

## 2018-05-21 DIAGNOSIS — I129 Hypertensive chronic kidney disease with stage 1 through stage 4 chronic kidney disease, or unspecified chronic kidney disease: Secondary | ICD-10-CM | POA: Diagnosis not present

## 2018-05-21 DIAGNOSIS — Z8249 Family history of ischemic heart disease and other diseases of the circulatory system: Secondary | ICD-10-CM | POA: Diagnosis not present

## 2018-05-21 DIAGNOSIS — Z79899 Other long term (current) drug therapy: Secondary | ICD-10-CM | POA: Diagnosis not present

## 2018-05-21 DIAGNOSIS — Z90710 Acquired absence of both cervix and uterus: Secondary | ICD-10-CM | POA: Diagnosis not present

## 2018-05-21 NOTE — Progress Notes (Signed)
  Radiation Oncology         (336) [phone] ________________________________  Name: Jamie Macdonald MRN: [id number]  Date: 05/21/2018  DOB: October 14, 1945  CC: Leighton Ruff, MD  Leighton Ruff, MD  HDR BRACHYTHERAPY NOTE  DIAGNOSIS: 73 y.o. female with FIGO stage IA grade II endometrioid adenocarcinoma   Simple treatment device note: Patient had construction of her custom vaginal cylinder. She will be treated with a 3.0 cm diameter segmented cylinder. This conforms to her anatomy without undue discomfort.  Vaginal brachytherapy procedure node: The patient was brought to the [place] suite. Identity was confirmed. All relevant records and images related to the planned course of therapy were reviewed. The patient freely provided informed written consent to proceed with treatment after reviewing the details related to the planned course of therapy. The consent form was witnessed and verified by the simulation staff. Then, the patient was set-up in a stable reproducible supine position for radiation therapy. Pelvic exam revealed the vaginal cuff to be intact. The patient's custom vaginal cylinder was placed in the proximal vagina. This was affixed to the CT/MR stabilization plate to prevent slippage. Patient tolerated the placement well.  Verification simulation note:  A fiducial marker was placed within the vaginal cylinder. An AP and lateral film was then obtained through the pelvis area. This documented accurate position of the vaginal cylinder for treatment.  HDR BRACHYTHERAPY TREATMENT  The remote afterloading device was affixed to the vaginal cylinder by catheter. Patient then proceeded to undergo her third high-dose-rate treatment directed at the proximal vagina. The patient was prescribed a dose of 6.0 gray to be delivered to the mucosal surface. Treatment length was 3.0 cm. Patient was treated with 1 channel using 7 dwell positions. Treatment time was 224.5 seconds. Iridium 192 was the  high-dose-rate source for treatment. The patient tolerated the treatment well. After completion of her therapy, a radiation survey was performed documenting return of the iridium source into the Gamma[employer] safe.   PLAN: The patient will return on 05/28/2018 for her fourth HDR treatment. ________________________________  Blair Promise, PhD, MD   This document serves as a record of services personally performed by Gery Pray, MD. It was created on his behalf by Rae Lips, a trained medical scribe. The creation of this record is based on the scribe's personal observations and the provider's statements to them. This document has been checked and approved by the attending provider.

## 2018-05-22 ENCOUNTER — Telehealth: Payer: Self-pay

## 2018-05-22 NOTE — Telephone Encounter (Signed)
Ms Mckillop has multiple q;uestions regarding her genetics appointment on 07-01-18. She would like Ms Florene Glen to call her in the next ~ week to discuss questions. Told Ms Finkle that this message would be forwarded to Roma Kayser so she can know that Ms Benett would like to speak with her.

## 2018-05-27 ENCOUNTER — Telehealth: Payer: Self-pay | Admitting: *Deleted

## 2018-05-27 NOTE — Telephone Encounter (Signed)
Called patient to remind of [place]. for 05-28-18 @ 9 am, spoke with patient and she is aware of this tx.

## 2018-05-28 ENCOUNTER — Ambulatory Visit
Admission: RE | Admit: 2018-05-28 | Discharge: 2018-05-28 | Disposition: A | Discharge status: Home / Self Care | Payer: Medicare HMO | Source: Ambulatory Visit | Attending: Radiation Oncology | Admitting: Radiation Oncology

## 2018-05-28 ENCOUNTER — Other Ambulatory Visit: Payer: Self-pay

## 2018-05-28 DIAGNOSIS — I129 Hypertensive chronic kidney disease with stage 1 through stage 4 chronic kidney disease, or unspecified chronic kidney disease: Secondary | ICD-10-CM | POA: Diagnosis not present

## 2018-05-28 DIAGNOSIS — Z90710 Acquired absence of both cervix and uterus: Secondary | ICD-10-CM | POA: Diagnosis not present

## 2018-05-28 DIAGNOSIS — Z8249 Family history of ischemic heart disease and other diseases of the circulatory system: Secondary | ICD-10-CM | POA: Diagnosis not present

## 2018-05-28 DIAGNOSIS — C541 Malignant neoplasm of endometrium: Secondary | ICD-10-CM

## 2018-05-28 DIAGNOSIS — N189 Chronic kidney disease, unspecified: Secondary | ICD-10-CM | POA: Diagnosis not present

## 2018-05-28 DIAGNOSIS — E1122 Type 2 diabetes mellitus with diabetic chronic kidney disease: Secondary | ICD-10-CM | POA: Diagnosis not present

## 2018-05-28 DIAGNOSIS — Z79899 Other long term (current) drug therapy: Secondary | ICD-10-CM | POA: Diagnosis not present

## 2018-05-28 DIAGNOSIS — Z886 Allergy status to analgesic agent status: Secondary | ICD-10-CM | POA: Diagnosis not present

## 2018-05-28 DIAGNOSIS — Z7984 Long term (current) use of oral hypoglycemic drugs: Secondary | ICD-10-CM | POA: Diagnosis not present

## 2018-05-28 NOTE — Progress Notes (Signed)
  Radiation Oncology         (336) [phone] ________________________________  Name: Jamie Macdonald MRN: [id number]  Date: 05/28/2018  DOB: Jan 07, 1946  CC: Leighton Ruff, MD  Leighton Ruff, MD  HDR BRACHYTHERAPY NOTE  DIAGNOSIS: 73 y.o. female with FIGO stage IA grade II endometrioid adenocarcinoma   Simple treatment device note: Patient had construction of her custom vaginal cylinder. She will be treated with a 3.0 cm diameter segmented cylinder. This conforms to her anatomy without undue discomfort.  Vaginal brachytherapy procedure node: The patient was brought to the [place] suite. Identity was confirmed. All relevant records and images related to the planned course of therapy were reviewed. The patient freely provided informed written consent to proceed with treatment after reviewing the details related to the planned course of therapy. The consent form was witnessed and verified by the simulation staff. Then, the patient was set-up in a stable reproducible supine position for radiation therapy. Pelvic exam revealed the vaginal cuff to be intact. The patient's custom vaginal cylinder was placed in the proximal vagina. This was affixed to the CT/MR stabilization plate to prevent slippage. Patient tolerated the placement well.  Verification simulation note:  A fiducial marker was placed within the vaginal cylinder. An AP and lateral film was then obtained through the pelvis area. This documented accurate position of the vaginal cylinder for treatment.  HDR BRACHYTHERAPY TREATMENT  The remote afterloading device was affixed to the vaginal cylinder by catheter. Patient then proceeded to undergo her fourth high-dose-rate treatment directed at the proximal vagina. The patient was prescribed a dose of 6.0 gray to be delivered to the mucosal surface. Treatment length was 3.0 cm. Patient was treated with 1 channel using 7 dwell positions. Treatment time was 239.7 seconds. Iridium 192 was the  high-dose-rate source for treatment. The patient tolerated the treatment well. After completion of her therapy, a radiation survey was performed documenting return of the iridium source into the [employer] safe.   PLAN: The patient will return on 06/04/2018 for her fifth and final HDR treatment. ________________________________  Blair Promise, PhD, MD   This document serves as a record of services personally performed by Gery Pray, MD. It was created on his behalf by Rae Lips, a trained medical scribe. The creation of this record is based on the scribe's personal observations and the provider's statements to them. This document has been checked and approved by the attending provider.

## 2018-06-01 ENCOUNTER — Telehealth: Payer: Self-pay | Admitting: Oncology

## 2018-06-01 NOTE — Telephone Encounter (Signed)
Lunah called and wanted to talk about genetic counseling and why she needs it.  Advised her that is to check for any genetic mutations that would put her or her family more at risk for cancer.

## 2018-06-03 ENCOUNTER — Other Ambulatory Visit: Payer: Self-pay

## 2018-06-03 ENCOUNTER — Telehealth: Payer: Self-pay | Admitting: *Deleted

## 2018-06-03 DIAGNOSIS — R300 Dysuria: Secondary | ICD-10-CM

## 2018-06-03 NOTE — Telephone Encounter (Signed)
CALLED PATIENT TO REMIND OF [name] 06-04-18 @ 9 AM, SPOKE WITH PATIENT AND SHE IS AWARE OF THIS [place].

## 2018-06-04 ENCOUNTER — Ambulatory Visit
Admission: RE | Admit: 2018-06-04 | Discharge: 2018-06-04 | Disposition: A | Discharge status: Home / Self Care | Payer: Medicare HMO | Source: Ambulatory Visit | Attending: Radiation Oncology | Admitting: Radiation Oncology

## 2018-06-04 ENCOUNTER — Encounter: Payer: Self-pay | Admitting: Radiation Oncology

## 2018-06-04 ENCOUNTER — Other Ambulatory Visit: Payer: Self-pay

## 2018-06-04 DIAGNOSIS — Z886 Allergy status to analgesic agent status: Secondary | ICD-10-CM | POA: Diagnosis not present

## 2018-06-04 DIAGNOSIS — N189 Chronic kidney disease, unspecified: Secondary | ICD-10-CM | POA: Diagnosis not present

## 2018-06-04 DIAGNOSIS — C541 Malignant neoplasm of endometrium: Secondary | ICD-10-CM

## 2018-06-04 DIAGNOSIS — Z8249 Family history of ischemic heart disease and other diseases of the circulatory system: Secondary | ICD-10-CM | POA: Diagnosis not present

## 2018-06-04 DIAGNOSIS — I129 Hypertensive chronic kidney disease with stage 1 through stage 4 chronic kidney disease, or unspecified chronic kidney disease: Secondary | ICD-10-CM | POA: Diagnosis not present

## 2018-06-04 DIAGNOSIS — Z79899 Other long term (current) drug therapy: Secondary | ICD-10-CM | POA: Diagnosis not present

## 2018-06-04 DIAGNOSIS — Z90710 Acquired absence of both cervix and uterus: Secondary | ICD-10-CM | POA: Diagnosis not present

## 2018-06-04 DIAGNOSIS — R300 Dysuria: Secondary | ICD-10-CM

## 2018-06-04 DIAGNOSIS — Z7984 Long term (current) use of oral hypoglycemic drugs: Secondary | ICD-10-CM | POA: Diagnosis not present

## 2018-06-04 DIAGNOSIS — E1122 Type 2 diabetes mellitus with diabetic chronic kidney disease: Secondary | ICD-10-CM | POA: Diagnosis not present

## 2018-06-04 LAB — URINALYSIS, COMPLETE (UACMP) WITH MICROSCOPIC
Bilirubin Urine: NEGATIVE
Glucose, UA: NEGATIVE mg/dL
Ketones, ur: NEGATIVE mg/dL
Nitrite: NEGATIVE
Protein, ur: NEGATIVE mg/dL
Specific Gravity, Urine: 1.009 (ref 1.005–1.030)
WBC, UA: >50 WBC/hpf — ABNORMAL HIGH (ref 0–5)
pH: 5 (ref 5.0–8.0)

## 2018-06-04 NOTE — Progress Notes (Signed)
  Radiation Oncology         [phone]) [phone] ________________________________  Name: Jamie Macdonald MRN: [id number]  Date: 06/04/2018  DOB: 07-16-1945  CC: Leighton Ruff, MD  Leighton Ruff, MD  HDR BRACHYTHERAPY NOTE  DIAGNOSIS: 73 y.o. female with FIGO stage IA grade II endometrioid adenocarcinoma   Simple treatment device note: Patient had construction of her custom vaginal cylinder. She will be treated with a 3.0 cm diameter segmented cylinder. This conforms to her anatomy without undue discomfort.  Vaginal brachytherapy procedure node: The patient was brought to the [place] suite. Identity was confirmed. All relevant records and images related to the planned course of therapy were reviewed. The patient freely provided informed written consent to proceed with treatment after reviewing the details related to the planned course of therapy. The consent form was witnessed and verified by the simulation staff. Then, the patient was set-up in a stable reproducible supine position for radiation therapy. Pelvic exam revealed the vaginal cuff to be intact. The patient's custom vaginal cylinder was placed in the proximal vagina. This was affixed to the CT/MR stabilization plate to prevent slippage. Patient tolerated the placement well.  Verification simulation note:  A fiducial marker was placed within the vaginal cylinder. An AP and lateral film was then obtained through the pelvis area. This documented accurate position of the vaginal cylinder for treatment.  HDR BRACHYTHERAPY TREATMENT  The remote afterloading device was affixed to the vaginal cylinder by catheter. Patient then proceeded to undergo her fifth and final high-dose-rate treatment directed at the proximal vagina. The patient was prescribed a dose of 6.0 gray to be delivered to the mucosal surface. Treatment length was 3.0 cm. Patient was treated with 1 channel using 7 dwell positions. Treatment time was 256.0 seconds. Iridium 192 was the  high-dose-rate source for treatment. The patient tolerated the treatment well. After completion of her therapy, a radiation survey was performed documenting return of the iridium source into the Gamma[employer] safe.   PLAN: The patient has completed HDR treatment and will return to radiation oncology clinic for routine followup in one month. ________________________________  Blair Promise, PhD, MD   This document serves as a record of services personally performed by Gery Pray, MD. It was created on his behalf by Rae Lips, a trained medical scribe. The creation of this record is based on the scribe's personal observations and the provider's statements to them. This document has been checked and approved by the attending provider.

## 2018-06-04 NOTE — Progress Notes (Signed)
  Radiation Oncology         [phone]) [phone] ________________________________  Name: Jamie Macdonald MRN: [id number]  Date: 06/04/2018  DOB: 30-Mar-1945  End of Treatment Note  Diagnosis:   73 y.o. female with FIGO stage IA grade II endometrioid adenocarcinoma    Indication for treatment:  Curative       Radiation treatment dates:   05/07/2018, 05/18/2018, 05/21/2018, 05/28/2018, 06/04/2018  Site/dose:   Vagina, prescription to the mucosal surface / 30 Gy in 5 fractions of 6 Gy  Beams/energy:   HDR Ir-192 Vaginal Brachytherapy. The patient was treated with a 3 cm diameter cylinder with a treatment length of 3 cm.  Narrative: The patient tolerated radiation treatment relatively well.   The patient developed some urinary symptoms towards the end of treatment. Urinalysis and culture done today to rule out infection, results pending.  Plan: The patient has completed radiation treatment. The patient will return to radiation oncology clinic for routine followup in one month. I advised them to call or return sooner if they have any questions or concerns related to their recovery or treatment.  -----------------------------------  Blair Promise, PhD, MD  This document serves as a record of services personally performed by Gery Pray, MD. It was created on his behalf by Rae Lips, a trained medical scribe. The creation of this record is based on the scribe's personal observations and the provider's statements to them. This document has been checked and approved by the attending provider.

## 2018-06-05 LAB — URINE CULTURE: Culture: <10000 — AB

## 2018-06-10 ENCOUNTER — Telehealth: Payer: Self-pay

## 2018-06-10 NOTE — Telephone Encounter (Signed)
Contacted pt to relay message from Dr. Sondra Come that urine labs were negative. Pt verbalized understanding and agreement. Pt with questions re: upcoming appt. Pt inquired if pt would have a pelvic exam at next appt. Conveyed to pt that she would not have a pelvic exam at next appt as it is a one month f/u. Pt inquired what would happen if pt was still spotting at time of appt. Conveyed to pt that Dr. Sondra Come would be able to answer questions during that appt. Pt reassured and verbalized understanding and agreement. Loma Sousa, RN BSN

## 2018-06-16 ENCOUNTER — Telehealth: Payer: Self-pay

## 2018-06-16 ENCOUNTER — Other Ambulatory Visit: Payer: Self-pay | Admitting: Radiation Oncology

## 2018-06-16 MED ORDER — ESTRADIOL 0.1 MG/GM VA CREA: TOPICAL_CREAM | VAGINAL | 1 refills | Status: DC

## 2018-06-16 NOTE — Telephone Encounter (Signed)
Pt calling to report that external skin is "irritated" and is unable to sit down comfortably.  Pt is concerned that she is irritated from skin being wet from vaginal bleeding. Pt reports she is allergic to most personal care items. Pt thinks irritation is from thick pads.  Pt has started using panty liners. Pt describes a quarter size of blood but does not let pad stay on with that amount of blood on liner. Conveyed to pt that this RN would discuss with Dr. Sondra Come and contact her with his recommendations. Pt verbalized understanding and agreement. Loma Sousa, RN BSN

## 2018-06-24 ENCOUNTER — Telehealth: Payer: Self-pay

## 2018-06-24 NOTE — Telephone Encounter (Signed)
Pt calling to report that she was unaware that estrace cream had been called in. Pt confused as to multiple telephone calls with this RN and Dr. Sondra Come last week. Conveyed to pt that the prescription had been called in and that it was pt's own decision whether to use cream or not. Pt ruminating on [facility] pick up service and that "the pharmacy doesn't call and no one told me they were calling in anything".  Encouraged pt to contact pharmacy directly. Pt abruptly ended call. Loma Sousa, RN BSN

## 2018-06-26 ENCOUNTER — Telehealth: Payer: Self-pay | Admitting: Genetic Counselor

## 2018-06-26 NOTE — Telephone Encounter (Signed)
Called patient regarding upcoming webex appointment, per patient's request appointment has been cancelled. Patient unsure whether or not to reschedule but will call when ready.  Message to counserlers.

## 2018-07-01 ENCOUNTER — Encounter: Payer: Medicare HMO | Admitting: Genetic Counselor

## 2018-07-01 ENCOUNTER — Ambulatory Visit: Payer: Medicare HMO

## 2018-07-09 ENCOUNTER — Ambulatory Visit
Admission: RE | Admit: 2018-07-09 | Discharge: 2018-07-09 | Disposition: A | Discharge status: Home / Self Care | Payer: Medicare HMO | Source: Ambulatory Visit | Attending: Radiation Oncology | Admitting: Radiation Oncology

## 2018-07-09 ENCOUNTER — Other Ambulatory Visit: Payer: Self-pay

## 2018-07-09 ENCOUNTER — Encounter: Payer: Self-pay | Admitting: Radiation Oncology

## 2018-07-09 VITALS — BP 148/72 | HR 105 | Temp 97.3°F | Resp 18 | Ht 60.0 in | Wt 115.1 lb

## 2018-07-09 DIAGNOSIS — Z923 Personal history of irradiation: Secondary | ICD-10-CM | POA: Diagnosis not present

## 2018-07-09 DIAGNOSIS — C541 Malignant neoplasm of endometrium: Secondary | ICD-10-CM | POA: Diagnosis not present

## 2018-07-09 DIAGNOSIS — Z7984 Long term (current) use of oral hypoglycemic drugs: Secondary | ICD-10-CM | POA: Insufficient documentation

## 2018-07-09 DIAGNOSIS — Z79899 Other long term (current) drug therapy: Secondary | ICD-10-CM | POA: Insufficient documentation

## 2018-07-09 DIAGNOSIS — R197 Diarrhea, unspecified: Secondary | ICD-10-CM | POA: Insufficient documentation

## 2018-07-09 DIAGNOSIS — Z08 Encounter for follow-up examination after completed treatment for malignant neoplasm: Secondary | ICD-10-CM | POA: Diagnosis not present

## 2018-07-09 NOTE — Progress Notes (Signed)
Pt presents today for f/u with Dr. Sondra Come. Pt denies c/o pain. Pt has burning/irritation in peri area and pt reports this as frequent. Pt denies dysuria/hematuria. Pt reports vaginal bleeding. Pt wears 2-3 panty liners at a time, and changes pads every hour or more frequently. Pt reports blood is roughly the size of a silver dollar. Pt denies rectal bleeding. Pt reports occasional diarrhea. Pt denies abdominal bloating, N/V.  Pt with circular thinking re: estrace. Conveyed to pt that multiple physicians had recommended estrace to relieve vaginal bleeding and the decision to use or not use estrace was ultimately the pt's decision. Pt to discuss further with Dr. Sondra Come at this visit.   BP (!) 14/2 (BP Location: Left Arm, Patient Position: Sitting)   Pulse (!) 120   Temp (!) 97.3 F (36.3 C) (Temporal)   Resp 18   Ht 5' (1.524 m)   Wt 115 lb 2 oz (52.2 kg)   SpO2 98%   BMI 22.48 kg/m   Wt Readings from Last 3 Encounters:  07/09/18 115 lb 2 oz (52.2 kg)  05/07/18 125 lb 3.2 oz (56.8 kg)  04/15/18 128 lb (58.1 kg)   Loma Sousa, RN BSN

## 2018-07-09 NOTE — Progress Notes (Signed)
Radiation Oncology         [phone] ________________________________  Name: Jamie Macdonald MRN: [id number]  Date: 07/09/2018  DOB: [birth year]  Follow-Up Visit Note  CC: Leighton Ruff, MD  Leighton Ruff, MD    ICD-10-CM   1. Endometrial cancer ([place])  C54.1     Diagnosis:   73 y.o. female with FIGO stage IA grade II endometrioid adenocarcinoma    Interval Since Last Radiation:  5 weeks   HDR Radiation treatment dates:   05/07/2018, 05/18/2018, 05/21/2018, 05/28/2018, 06/04/2018  Site/dose:   Vagina, prescription to the mucosal surface / 30 Gy in 5 fractions of 6 Gy  Narrative:  The patient returns today for routine follow-up.  She denies any pain. She has burning/irritation perineum and reports this is frequent. She denies dysuria or hematuria. She reports vaginal bleeding. She wears 2-3 panty liners at a time and changes pads every hour or more frequently. She reports amount of blood is roughly the size of a silver dollar. She was given a prescription for Estrace but has not used it. She denies rectal bleeding. She reports occasional diarrhea. She denies abdominal bloating, nausea, or vomiting.                           ALLERGIES:  is allergic to nsaids.  Meds: Current Outpatient Medications  Medication Sig Dispense Refill  . ACCU-CHEK AVIVA PLUS test strip     . Accu-Chek Softclix Lancets lancets     . Blood Glucose Monitoring Suppl (ACCU-CHEK AVIVA PLUS) w/Device KIT     . Cholecalciferol (VITAMIN D) 50 MCG (2000 UT) tablet Take 2,000 Units by mouth daily.    . Flax Oil-Fish Oil-Borage Oil (FISH OIL-FLAX OIL-BORAGE OIL PO) Take 1 capsule by mouth daily.     Marland Kitchen lisinopril (PRINIVIL,ZESTRIL) 10 MG tablet Take 10 mg by mouth daily.    . metFORMIN (GLUCOPHAGE) 500 MG tablet Take 500 mg by mouth 2 (two) times daily with a meal.     . Multiple Vitamins-Minerals (MULTIVITAMIN ADULT PO) Take 1 tablet by mouth daily.     Marland Kitchen estradiol (ESTRACE) 0.1 MG/GM vaginal cream Insert a  pea size amount in the vagina at bedtime every other night for 6 weeks (Patient not taking: Reported on 07/09/2018) 42.5 g 1   No current facility-administered medications for this encounter.     Physical Findings: The patient is in no acute distress. Patient is alert and oriented.  height is 5' (1.524 m) and weight is 115 lb 2 oz (52.2 kg). Her temporal temperature is 97.3 F (36.3 C) (abnormal). Her blood pressure is 148/72 (abnormal) and her pulse is 105 (abnormal). Her respiration is 18 and oxygen saturation is 98%.   Lungs are clear to auscultation bilaterally. Heart has regular rate and rhythm. No palpable cervical, supraclavicular, or axillary adenopathy. Abdomen soft, non-tender, normal bowel sounds.  Pelvic exam deferred in light of recent completion of treatment.   Lab Findings: Lab Results  Component Value Date   WBC 4.6 04/15/2018   HGB 10.7 (L) 04/15/2018   HCT 32.6 (L) 04/15/2018   MCV 88.8 04/15/2018   PLT 230 04/15/2018    Radiographic Findings: No results found.  Impression:  FIGO stage IA grade II endometrioid adenocarcinoma. The patient is recovering from the effects of radiation.  Clinically stable one month out from vaginal brachytherapy. I had a long discussion with the patient concerning her prescription for vaginal Estrace. She has reviewed  the potential interactions of the medication, and it does sound like in the past she has had an allergic reaction to this medication, so we will place this in her allergy list. If she did not have an allergic reaction to this medication, this could have helped with her healing process and continued vaginal bleeding.   Plan:  Patient will follow up with Dr. Denman George in 2 months and radiation oncology in 5 months. Patient will also be scheduling follow-up with her urologist. Today the patient was given a vaginal dilator and instructions on its use. She will call if she has any unusual vaginal bleeding when using this equipment.   ____________________________________  Blair Promise, PhD, MD  This document serves as a record of services personally performed by Gery Pray, MD. It was created on his behalf by Rae Lips, a trained medical scribe. The creation of this record is based on the scribe's personal observations and the provider's statements to them. This document has been checked and approved by the attending provider.

## 2018-07-09 NOTE — Patient Instructions (Signed)
Coronavirus (COVID-19) Are you at risk?  Are you at risk for the Coronavirus (COVID-19)?  To be considered HIGH RISK for Coronavirus (COVID-19), you have to meet the following criteria:  . Traveled to [place], [place], [place], [place] or [place]; or in the [place] to [place], [place], [place], or [place]; and have fever, cough, and shortness of breath within the last 2 weeks of travel OR . Been in close contact with a person diagnosed with COVID-19 within the last 2 weeks and have fever, cough, and shortness of breath . IF YOU DO NOT MEET THESE CRITERIA, YOU ARE CONSIDERED LOW RISK FOR COVID-19.  What to do if you are HIGH RISK for COVID-19?  . If you are having a medical emergency, call 911. . Seek medical care right away. Before you go to a doctor's office, urgent care or emergency department, call ahead and tell them about your recent travel, contact with someone diagnosed with COVID-19, and your symptoms. You should receive instructions from your physician's office regarding next steps of care.  . When you arrive at healthcare provider, tell the healthcare staff immediately you have returned from visiting [place], [place], [place], [place] or [place]; or traveled in the [place] to [place], [place], [place], or [place]; in the last two weeks or you have been in close contact with a person diagnosed with COVID-19 in the last 2 weeks.   . Tell the health care staff about your symptoms: fever, cough and shortness of breath. . After you have been seen by a medical provider, you will be either: o Tested for (COVID-19) and discharged home on quarantine except to seek medical care if symptoms worsen, and asked to  - Stay home and avoid contact with others until you get your results (4-5 days)  - Avoid travel on public transportation if possible (such as bus, train, or airplane) or o Sent to the Emergency Department by EMS for evaluation, COVID-19 testing, and possible  admission depending on your condition and test results.  What to do if you are LOW RISK for COVID-19?  Reduce your risk of any infection by using the same precautions used for avoiding the common cold or flu:  . Wash your hands often with soap and warm water for at least 20 seconds.  If soap and water are not readily available, use an alcohol-based hand sanitizer with at least 60% alcohol.  . If coughing or sneezing, cover your mouth and nose by coughing or sneezing into the elbow areas of your shirt or coat, into a tissue or into your sleeve (not your hands). . Avoid shaking hands with others and consider head nods or verbal greetings only. . Avoid touching your eyes, nose, or mouth with unwashed hands.  . Avoid close contact with people who are sick. . Avoid places or events with large numbers of people in one location, like concerts or sporting events. . Carefully consider travel plans you have or are making. . If you are planning any travel outside or inside the [place], visit the CDC's Travelers' Health webpage for the latest health notices. . If you have some symptoms but not all symptoms, continue to monitor at home and seek medical attention if your symptoms worsen. . If you are having a medical emergency, call 911.   ADDITIONAL HEALTHCARE OPTIONS FOR PATIENTS  [facility] / e-Visit: [email or web address]         [facility]: [phone]  [facility]: [phone]                   [facility]: [phone]   

## 2018-07-09 NOTE — Progress Notes (Signed)
  Home Care Instructions for the Insertion and Care of Your Vaginal Dilator  Why Do I Need a Vaginal Dilator?  Internal radiation therapy may cause scar tissue to form at the top of your vagina (vaginal cuff).  This may make vaginal examinations difficult in the future. You can prevent scar tissue from forming by using a vaginal dilator (a smooth plastic rod), and/or by having regular sexual intercourse.  If not using the dilator you should be having intercourse two or three times a week.  If you are unable to have intercourse, you should use your vaginal dilator.  You may have some spotting or bleeding from your dilator or intercourse the first few times. You may also have some discomfort. If discomfort occurs with intercourse, you and your partner may need to stop for a while and try again later.  How to Use Your Vaginal Dilator  - Wash the dilator with soap and water before and after each use. - Check the dilator to be sure it is smooth. Do not use the dilator if you find any roughspots. - Coat the dilator with K-Y Jelly, Astroglide, or Replens. Do not use Vaseline, baby oil, or other oil based lubricants. They are not water-soluble and can be irritating to the tissues in the vagina. - Lie on your back with your knees bent and legs apart. - Insert the rounded end of the dilator into your vagina as far as it will go without causing pain or discomfort. - Close your knees and slowly straighten your legs. - Keep the dilator in your vagina for about 10 to 15 minutes.  Please use 3 times a week, for example: Monday, Wednesday and Friday evenings. [facility] your knees, open your legs, and gently remove the dilator. - Gently cleanse the skin around the vaginal opening. - Wash the dilator after each use. -  It is important that you use the dilator routinely until instructed otherwise by your doctor.   Loma Sousa, RN BSN

## 2018-07-10 DIAGNOSIS — N819 Female genital prolapse, unspecified: Secondary | ICD-10-CM | POA: Diagnosis not present

## 2018-07-10 DIAGNOSIS — N3000 Acute cystitis without hematuria: Secondary | ICD-10-CM | POA: Diagnosis not present

## 2018-07-13 ENCOUNTER — Telehealth: Payer: Self-pay

## 2018-07-13 NOTE — Telephone Encounter (Signed)
Returned pt's VM from over the weekend. VM was re: labs that were drawn in April 2020 and also November 2019. Upon contact with pt, this RN encouraged pt to have PCP visit and request labs drawn with PCP office to best monitor overall health. Explained that Dr. Sondra Come is pt's Radiation Oncologist and the PCP would be the best choice to monitor labs, weight loss, and provide any needed interventions. Pt repeating, "I'll figure something out. I'll do it. I'll figure something out."  Inquired if this RN could assist pt further and pt reported no. Loma Sousa, RN BSN

## 2018-07-15 ENCOUNTER — Telehealth: Payer: Self-pay | Admitting: *Deleted

## 2018-07-15 NOTE — Telephone Encounter (Signed)
Called patient to inform of fu with Dr. Denman George on 08-24-18- arrival time- 1:30 pm, spoke with patient and she is aware of this appt.

## 2018-07-15 NOTE — Telephone Encounter (Signed)
Enid Derry from radiation called and scheduled the patient for an appt in August. Appt made and Enid Derry will contact the patient

## 2018-07-20 ENCOUNTER — Telehealth: Payer: Self-pay

## 2018-07-20 NOTE — Telephone Encounter (Signed)
Pt calling "to ask again why I can't have my labs drawn at the cancer center". Conveyed to pt that her PCP would be the best physician to contact for ongoing labs, weight loss, and overall health. Conveyed to pt that it may be possible to have PCP order labs and then have them drawn at cancer center. Pt with mumbling and rumination that "they may not even be onsite. I don't know what's all walking in there. I know Dr. Denman George is my doctor unless she doesn't want to see me. I just feel safer at the cancer center. I'm sorry I bothered you". Conveyed to pt that she should contact [name] office to discuss this as this RN is in [employer]. Pt ended call. Loma Sousa, RN BSN

## 2018-07-21 DIAGNOSIS — Z7984 Long term (current) use of oral hypoglycemic drugs: Secondary | ICD-10-CM | POA: Diagnosis not present

## 2018-07-21 DIAGNOSIS — I10 Essential (primary) hypertension: Secondary | ICD-10-CM | POA: Diagnosis not present

## 2018-07-21 DIAGNOSIS — E1122 Type 2 diabetes mellitus with diabetic chronic kidney disease: Secondary | ICD-10-CM | POA: Diagnosis not present

## 2018-07-21 DIAGNOSIS — Z90710 Acquired absence of both cervix and uterus: Secondary | ICD-10-CM | POA: Diagnosis not present

## 2018-07-21 DIAGNOSIS — Z8542 Personal history of malignant neoplasm of other parts of uterus: Secondary | ICD-10-CM | POA: Diagnosis not present

## 2018-07-21 DIAGNOSIS — N8110 Cystocele, unspecified: Secondary | ICD-10-CM | POA: Diagnosis not present

## 2018-07-28 ENCOUNTER — Telehealth: Payer: Self-pay

## 2018-07-28 NOTE — Telephone Encounter (Signed)
Returned pt's VM. Re-educated pt on the purpose of the vaginal dilator. Conveyed to pt that the purpose of the vaginal dilator is to keep the vaginal cuff patent for future exams. Pt verbalized understanding and agreement. Pt was informed of upcoming appts with both Dr. Denman George and Dr. Sondra Come. Pt was informed that this RN has left VM at PCP's office for their fax number to send pt's last OV to Dr. Drema Dallas per pt request. Pt with circular thinking and difficult to redirect. Pt ruminating on whether she should see her nephrologist due to current Covid conditions. This RN reiterated need for vaginal dilator, upcoming appts, and awaiting fax number. Pt verbalized understanding. Loma Sousa, RN BSN

## 2018-08-04 DIAGNOSIS — N183 Chronic kidney disease, stage 3 (moderate): Secondary | ICD-10-CM | POA: Diagnosis not present

## 2018-08-12 DIAGNOSIS — I129 Hypertensive chronic kidney disease with stage 1 through stage 4 chronic kidney disease, or unspecified chronic kidney disease: Secondary | ICD-10-CM | POA: Diagnosis not present

## 2018-08-12 DIAGNOSIS — N183 Chronic kidney disease, stage 3 (moderate): Secondary | ICD-10-CM | POA: Diagnosis not present

## 2018-08-12 DIAGNOSIS — E1122 Type 2 diabetes mellitus with diabetic chronic kidney disease: Secondary | ICD-10-CM | POA: Diagnosis not present

## 2018-08-18 ENCOUNTER — Telehealth: Payer: Self-pay

## 2018-08-18 NOTE — Telephone Encounter (Signed)
Patient requesting follow up okay to schedule

## 2018-08-20 ENCOUNTER — Telehealth: Payer: Self-pay | Admitting: Oncology

## 2018-08-20 NOTE — Telephone Encounter (Signed)
Jamie Macdonald called and said she received a robo call that didn't make sense from [facility].  She wants to verify her appointment with Dr. Denman George on Monday.  Advised her the appointment is for 08/24/18 at 2 pm.  She verbalized understanding.

## 2018-08-24 ENCOUNTER — Other Ambulatory Visit: Payer: Self-pay

## 2018-08-24 ENCOUNTER — Inpatient Hospital Stay: Payer: Medicare HMO | Attending: Gynecologic Oncology | Admitting: Gynecologic Oncology

## 2018-08-24 ENCOUNTER — Encounter: Payer: Self-pay | Admitting: Gynecologic Oncology

## 2018-08-24 VITALS — BP 158/65 | HR 20 | Temp 98.7°F | Ht 60.0 in | Wt 114.0 lb

## 2018-08-24 DIAGNOSIS — F329 Major depressive disorder, single episode, unspecified: Secondary | ICD-10-CM | POA: Diagnosis not present

## 2018-08-24 DIAGNOSIS — F419 Anxiety disorder, unspecified: Secondary | ICD-10-CM | POA: Insufficient documentation

## 2018-08-24 DIAGNOSIS — Z79899 Other long term (current) drug therapy: Secondary | ICD-10-CM | POA: Insufficient documentation

## 2018-08-24 DIAGNOSIS — Z923 Personal history of irradiation: Secondary | ICD-10-CM | POA: Diagnosis not present

## 2018-08-24 DIAGNOSIS — I129 Hypertensive chronic kidney disease with stage 1 through stage 4 chronic kidney disease, or unspecified chronic kidney disease: Secondary | ICD-10-CM | POA: Insufficient documentation

## 2018-08-24 DIAGNOSIS — Z7984 Long term (current) use of oral hypoglycemic drugs: Secondary | ICD-10-CM | POA: Diagnosis not present

## 2018-08-24 DIAGNOSIS — E1122 Type 2 diabetes mellitus with diabetic chronic kidney disease: Secondary | ICD-10-CM | POA: Diagnosis not present

## 2018-08-24 DIAGNOSIS — Z90722 Acquired absence of ovaries, bilateral: Secondary | ICD-10-CM | POA: Diagnosis not present

## 2018-08-24 DIAGNOSIS — Z90710 Acquired absence of both cervix and uterus: Secondary | ICD-10-CM | POA: Diagnosis not present

## 2018-08-24 DIAGNOSIS — C541 Malignant neoplasm of endometrium: Secondary | ICD-10-CM | POA: Insufficient documentation

## 2018-08-24 DIAGNOSIS — N183 Chronic kidney disease, stage 3 (moderate): Secondary | ICD-10-CM | POA: Diagnosis not present

## 2018-08-24 NOTE — Progress Notes (Signed)
Follow-up Note: Gyn-Onc  Consult was requested by Dr. Nelda Marseille for the evaluation of Jamie Macdonald 73 y.o. female  CC:  Chief Complaint  Patient presents with  . endometrial cancer    Assessment/Plan:  Jamie Macdonald  is a 73 y.o.  year old with a history stage endometrioid endometrial adenocarcinoma, and vaginal prolapse, s/p staging and abdominal sacrocolpopexy. High/intermediate risk factors for recurrence. S/p vaginal brachytherapy completed 06/04/18.  No evidence of recurrence.  Plan is for follow-up with Dr Sondra Come in 3 months and myself in 6 months.   HPI: Jamie Macdonald is a 73 year old woman who is seen in consultation at the request of Dr Nelda Marseille for grade 1 endometrial cancer.   The patient reports a history of occasional bleeding symptoms since the beginning of November 2019.  She was on estradiol while using her pessary which had been placed in 2019 for severe pelvic organ prolapse.  She was evaluated by Dr. Nelda Marseille who performed a D&C on January 30, 2018.  This revealed well differentiated endometrial adenocarcinoma with foci approaching moderate differentiation.  The patient has a history of severe pelvic organ prolapse that is symptomatic.  She saw Dr. Maryland Pink in May 2018 who prescribed vaginal estrogen.  She then was seen back later in the year and recommended hysterectomy with a tacking procedure.  The patient was concerned about the use of vaginal mesh and therefore declined at this time.  She subsequently saw Dr. Baruch Goldmann who placed a vaginal pessary which does help with her symptoms of prolapse however the patient does not like having to wear a pessary and desires definitive surgical management.  Her medical history is significant for very well controlled type 2 diabetes mellitus.  She regular checks her blood glucose levels and takes oral agent (metformin) but no insulin for this.  Her last hemoglobin A1c was less than 6%.  She also has hypertension and some anxiety and  depression.  Her surgical history is significant for a surgical cone and a tubal ligation but no other major abdominal surgeries.  Her family history is unremarkable for malignancy.  On 03/19/18 she underwent a robotic assisted total hysterectomy BSO, SLN biopsy and abdominal sacrocolpopexy. Postoperatively she had a prolonged hospitalization due to self-induced hyponatremia due to water intoxication.   Final pathology revealed stage IA grade 2 endometrioid endometrial cancer with 1 mm depth of invasion total thickness greater than 30 mm.  This represented less than one half.  No lymphovascular space invasion.  Negative sentinel lymph nodes, cervix, right adnexa.  Tumor measured 1.4 cm.  It was a FIGO grade 2.  Due to her age of 73 and a FIGO grade 2 cancer, she meets high intermediate risk factor criteria. She was determined to have high intermediate risk features and therefore vaginal brachytherapy was recommended in accordance with NCCN guidelines to minimize local recurrence.   Interval Hx:  The patient received a total of 30 Gy vaginal brachii therapy delivered in 5 fractions of 6 Gy.  Therapy was conducted between May 07, 2018 and completed Jun 04, 2018. Jamie Macdonald reports occasional blood with using the dilator.  She reports that she is using it 3 times a week.  Current Meds:  Outpatient Encounter Medications as of 08/24/2018  Medication Sig  . ACCU-CHEK AVIVA PLUS test strip   . Accu-Chek Softclix Lancets lancets   . Blood Glucose Monitoring Suppl (ACCU-CHEK AVIVA PLUS) w/Device KIT   . Cholecalciferol (VITAMIN D) 50 MCG (2000 UT) tablet Take  2,000 Units by mouth daily.  Marland Kitchen estradiol (ESTRACE) 0.1 MG/GM vaginal cream Insert a pea size amount in the vagina at bedtime every other night for 6 weeks (Patient not taking: Reported on 07/09/2018)  . Flax Oil-Fish Oil-Borage Oil (FISH OIL-FLAX OIL-BORAGE OIL PO) Take 1 capsule by mouth daily.   Marland Kitchen lisinopril (PRINIVIL,ZESTRIL) 10 MG tablet Take 10 mg by  mouth daily.  . metFORMIN (GLUCOPHAGE) 500 MG tablet Take 500 mg by mouth 2 (two) times daily with a meal.   . Multiple Vitamins-Minerals (MULTIVITAMIN ADULT PO) Take 1 tablet by mouth daily.    No facility-administered encounter medications on file as of 08/24/2018.     Allergy:  Allergies  Allergen Reactions  . Nsaids Other (See Comments)    Due to chronic kidney disease pt does not take any celebrex, motrin, aleve etc.  . Estrace [Estradiol]     Social Hx:   Social History   Socioeconomic History  . Marital status: Divorced    Spouse name: Not on file  . Number of children: Not on file  . Years of education: Not on file  . Highest education level: Not on file  Occupational History  . Not on file  Social Needs  . Financial resource strain: Not on file  . Food insecurity    Worry: Not on file    Inability: Not on file  . Transportation needs    Medical: Not on file    Non-medical: Not on file  Tobacco Use  . Smoking status: Never Smoker  . Smokeless tobacco: Never Used  Substance and Sexual Activity  . Alcohol use: No  . Drug use: Never  . Sexual activity: Not on file  Lifestyle  . Physical activity    Days per week: Not on file    Minutes per session: Not on file  . Stress: Not on file  Relationships  . Social Herbalist on phone: Not on file    Gets together: Not on file    Attends religious service: Not on file    Active member of club or organization: Not on file    Attends meetings of clubs or organizations: Not on file    Relationship status: Not on file  . Intimate partner violence    Fear of current or ex partner: Not on file    Emotionally abused: Not on file    Physically abused: Not on file    Forced sexual activity: Not on file  Other Topics Concern  . Not on file  Social History Narrative  . Not on file    Past Surgical Hx:  Past Surgical History:  Procedure Laterality Date  . CERVICAL CONING  74 OR 75  . DILATATION &  CURETTAGE/HYSTEROSCOPY WITH MYOSURE N/A 01/30/2018   Procedure: DILATATION & CURETTAGE/HYSTEROSCOPY WITH MYOSURE;  Surgeon: Janyth Pupa, DO;  Location: [facility];  Service: Gynecology;  Laterality: N/A;  . ROBOTIC ASSISTED LAPAROSCOPIC SACROCOLPOPEXY N/A 03/19/2018   Procedure: XI ROBOTIC ASSISTED LAPAROSCOPIC SACROCOLPOPEXY;  Surgeon: Ardis Hughs, MD;  Location: [facility];  Service: Urology;  Laterality: N/A;  . ROBOTIC ASSISTED TOTAL HYSTERECTOMY WITH BILATERAL SALPINGO OOPHERECTOMY Bilateral 03/19/2018   Procedure: XI ROBOTIC ASSISTED TOTAL HYSTERECTOMY WITH BILATERAL SALPINGO OOPHORECTOMY;  Surgeon: Everitt Amber, MD;  Location: [facility];  Service: Gynecology;  Laterality: Bilateral;  . SENTINEL NODE BIOPSY Bilateral 03/19/2018   Procedure: SENTINEL NODE BIOPSY;  Surgeon: Everitt Amber, MD;  Location: [facility];  Service: Gynecology;  Laterality: Bilateral;    Past Medical Hx:  Past Medical History:  Diagnosis Date  . Anemia   . Cancer [facility])    ENDOMETRIAL CANCER  . Chronic kidney disease    ckd STAGE 3LOV DR Janae Sauce NEPHROLOGY 12-12-17  . Diabetes mellitus   . Diabetes mellitus type 2 in nonobese ([place]) 01/16/2018  . Essential hypertension 01/16/2018  . Exposure to hepatitis C    AS CHILD  . Exposure to TB 1973   AS CHILD  . Fatty tumor    LEFT SHOULDER REMOVED  . Fibroids   . Hypertension    SLIGHT  . Uterine prolapse 01/16/2018  . UTI (urinary tract infection)    STARTED AMOXICILLIAN FRIDAY 03-13-2018    Past Gynecological History:  See HPI No LMP recorded. Patient is postmenopausal.  Family Hx:  Family History  Problem Relation Age of Onset  . Stroke Mother   . Parkinson's disease Mother   . Heart disease Father        HEART ATTACK  . Diabetes Father   . Prostate cancer Father   . Diabetes Sister   . Cancer Sister     Review of Systems:  Constitutional  Feels well,    ENT Normal appearing ears and nares bilaterally Skin/Breast  No rash, sores, jaundice,  itching, dryness Cardiovascular  No chest pain, shortness of breath, or edema  Pulmonary  No cough or wheeze.  Gastro Intestinal  No nausea, vomitting, or diarrhoea. No bright red blood per rectum, no abdominal pain, change in bowel movement, or constipation.  Genito Urinary  No frequency, urgency, dysuria, no bleeding Musculo Skeletal  No myalgia, arthralgia, joint swelling or pain  Neurologic  No weakness, numbness, change in gait,  Psychology  No depression, anxiety, insomnia.   Vitals:  Blood pressure (!) 158/65, pulse (!) 20, temperature 98.7 F (37.1 C), temperature source Oral, height 5' (1.524 m), weight 114 lb (51.7 kg), SpO2 99 %.  Physical Exam: WD in NAD Neck  Supple NROM, without any enlargements.  Lymph Node Survey No cervical supraclavicular or inguinal adenopathy Cardiovascular  Pulse normal rate, regularity and rhythm. S1 and S2 normal.  Lungs  Clear to auscultation bilateraly, without wheezes/crackles/rhonchi. Good air movement.  Skin  No rash/lesions/breakdown  Psychiatry  Alert and oriented to person, place, and time  Abdomen  Normoactive bowel sounds, abdomen soft, non-tender and not obese without evidence of hernia. Incisions soft Back No CVA tenderness Genito Urinary  Vaginal cuff healed and in tact. No lesions, trace blood but no apparent recurrence.  Rectal  deferred Extremities  No bilateral cyanosis, clubbing or edema.   Thereasa Solo, MD  08/24/2018, 4:59 PM

## 2018-08-24 NOTE — Patient Instructions (Signed)
Please notify Dr Denman George at phone number [phone] if you notice vaginal bleeding, new pelvic or abdominal pains, bloating, feeling full easy, or a change in bladder or bowel function.   Please contact Dr Serita Grit office (at [phone]) in October, 2020 to request an appointment with her for February 2021 or ask Dr Clabe Seal office to make this appointment for you when you come to see him later this year.  The kind of radiation that you received (vaginal brachytherapy) is NOT associated with an increased cancer risk. Cancer risk is seen to be elevated after patients receive a different kind of radiation called external beam radiation.

## 2018-08-25 DIAGNOSIS — H18821 Corneal disorder due to contact lens, right eye: Secondary | ICD-10-CM | POA: Diagnosis not present

## 2018-08-28 ENCOUNTER — Telehealth: Payer: Self-pay | Admitting: *Deleted

## 2018-08-28 NOTE — Telephone Encounter (Signed)
Patient called and asked if she can take a bath. Explained per Melissa APP "it's ok to take a bath. It is also ok to have a little bleeding with the dilator. If you start to  Have a period type bleeding call the office."

## 2018-08-31 DIAGNOSIS — E119 Type 2 diabetes mellitus without complications: Secondary | ICD-10-CM | POA: Diagnosis not present

## 2018-08-31 DIAGNOSIS — H5210 Myopia, unspecified eye: Secondary | ICD-10-CM | POA: Diagnosis not present

## 2018-08-31 DIAGNOSIS — I10 Essential (primary) hypertension: Secondary | ICD-10-CM | POA: Diagnosis not present

## 2018-08-31 DIAGNOSIS — Z135 Encounter for screening for eye and ear disorders: Secondary | ICD-10-CM | POA: Diagnosis not present

## 2018-08-31 DIAGNOSIS — Z0100 Encounter for examination of eyes and vision without abnormal findings: Secondary | ICD-10-CM | POA: Diagnosis not present

## 2018-08-31 DIAGNOSIS — H2513 Age-related nuclear cataract, bilateral: Secondary | ICD-10-CM | POA: Diagnosis not present

## 2018-08-31 DIAGNOSIS — D3132 Benign neoplasm of left choroid: Secondary | ICD-10-CM | POA: Diagnosis not present

## 2018-09-02 ENCOUNTER — Telehealth: Payer: Self-pay | Admitting: Oncology

## 2018-09-02 NOTE — Telephone Encounter (Signed)
Jamie Macdonald had left a message asking if she can take tub baths now.  Called her back and said it was ok and she said she had already talked to [name] office about it.

## 2018-09-08 ENCOUNTER — Telehealth: Payer: Self-pay

## 2018-09-08 NOTE — Telephone Encounter (Signed)
Pt calling re: Covid instructions on AVS. Reassured pt that the second page of AVS continues to include Covid information. Pt then wanting to discuss pt's HOA. This RN conveyed to pt that this RN could not discuss pt's HOA. Pt without any further questions/concerns. Loma Sousa, RN BSN

## 2018-09-29 DIAGNOSIS — Z23 Encounter for immunization: Secondary | ICD-10-CM | POA: Diagnosis not present

## 2018-10-20 ENCOUNTER — Telehealth: Payer: Self-pay

## 2018-10-20 NOTE — Telephone Encounter (Signed)
Pt calling to state that "remember on June 29th I came to see Dr. Sondra Come and I had to walk up the hill and my vital signs were all wrong and then I had to wait to have my vitals rechecked. Well, I went home and wrote a check to [name] attention Dr. Sondra Come for $45. The check never posted and I've since switched checking accounts and now I've gotten a statement that says I don't have a balance. Its from [employer]. I don't know what to do".    Pt with pressured speech and continued to repeat that she "must owe something and that check is from a different account but it never posted". This RN encouraged pt to never mail a check without receiving a bill.  Pt was not satisfied with that and stated that she has gotten bills from two separate entities in the past. Reminded pt that she is no longer under treatment at this time and her billing is most likely different. Pt unable to be redirected. This RN conveyed to pt that this RN can only access her health record, not her financial record. Pt was strongly encouraged to call number on statement she received to gain better clarity. Pt states she will do so. Loma Sousa, RN BSN

## 2018-10-27 DIAGNOSIS — N183 Chronic kidney disease, stage 3 unspecified: Secondary | ICD-10-CM | POA: Diagnosis not present

## 2018-11-02 DIAGNOSIS — I129 Hypertensive chronic kidney disease with stage 1 through stage 4 chronic kidney disease, or unspecified chronic kidney disease: Secondary | ICD-10-CM | POA: Diagnosis not present

## 2018-11-02 DIAGNOSIS — E1121 Type 2 diabetes mellitus with diabetic nephropathy: Secondary | ICD-10-CM | POA: Diagnosis not present

## 2018-11-02 DIAGNOSIS — N183 Chronic kidney disease, stage 3 unspecified: Secondary | ICD-10-CM | POA: Diagnosis not present

## 2018-11-02 DIAGNOSIS — N1832 Chronic kidney disease, stage 3b: Secondary | ICD-10-CM | POA: Diagnosis not present

## 2018-12-14 ENCOUNTER — Other Ambulatory Visit: Payer: Self-pay

## 2018-12-14 ENCOUNTER — Encounter: Payer: Self-pay | Admitting: Radiation Oncology

## 2018-12-14 ENCOUNTER — Ambulatory Visit
Admission: RE | Admit: 2018-12-14 | Discharge: 2018-12-14 | Disposition: A | Discharge status: Home / Self Care | Payer: Medicare HMO | Source: Ambulatory Visit | Attending: Radiation Oncology | Admitting: Radiation Oncology

## 2018-12-14 VITALS — BP 160/73 | HR 100 | Temp 98.2°F | Resp 20 | Wt 114.8 lb

## 2018-12-14 DIAGNOSIS — Z08 Encounter for follow-up examination after completed treatment for malignant neoplasm: Secondary | ICD-10-CM | POA: Diagnosis not present

## 2018-12-14 DIAGNOSIS — C541 Malignant neoplasm of endometrium: Secondary | ICD-10-CM

## 2018-12-14 DIAGNOSIS — Z8542 Personal history of malignant neoplasm of other parts of uterus: Secondary | ICD-10-CM | POA: Diagnosis not present

## 2018-12-14 NOTE — Patient Instructions (Signed)
Coronavirus (COVID-19) Are you at risk?  Are you at risk for the Coronavirus (COVID-19)?  To be considered HIGH RISK for Coronavirus (COVID-19), you have to meet the following criteria:  . Traveled to [place], [place], [place], [place] or [place]; or in the [place] to [place], [place], [place], or [place]; and have fever, cough, and shortness of breath within the last 2 weeks of travel OR . Been in close contact with a person diagnosed with COVID-19 within the last 2 weeks and have fever, cough, and shortness of breath . IF YOU DO NOT MEET THESE CRITERIA, YOU ARE CONSIDERED LOW RISK FOR COVID-19.  What to do if you are HIGH RISK for COVID-19?  . If you are having a medical emergency, call 911. . Seek medical care right away. Before you go to a doctor's office, urgent care or emergency department, call ahead and tell them about your recent travel, contact with someone diagnosed with COVID-19, and your symptoms. You should receive instructions from your physician's office regarding next steps of care.  . When you arrive at healthcare provider, tell the healthcare staff immediately you have returned from visiting [place], [place], [place], [place] or [place]; or traveled in the [place] to [place], [place], [place], or [place]; in the last two weeks or you have been in close contact with a person diagnosed with COVID-19 in the last 2 weeks.   . Tell the health care staff about your symptoms: fever, cough and shortness of breath. . After you have been seen by a medical provider, you will be either: o Tested for (COVID-19) and discharged home on quarantine except to seek medical care if symptoms worsen, and asked to  - Stay home and avoid contact with others until you get your results (4-5 days)  - Avoid travel on public transportation if possible (such as bus, train, or airplane) or o Sent to the Emergency Department by EMS for evaluation, COVID-19 testing, and possible  admission depending on your condition and test results.  What to do if you are LOW RISK for COVID-19?  Reduce your risk of any infection by using the same precautions used for avoiding the common cold or flu:  . Wash your hands often with soap and warm water for at least 20 seconds.  If soap and water are not readily available, use an alcohol-based hand sanitizer with at least 60% alcohol.  . If coughing or sneezing, cover your mouth and nose by coughing or sneezing into the elbow areas of your shirt or coat, into a tissue or into your sleeve (not your hands). . Avoid shaking hands with others and consider head nods or verbal greetings only. . Avoid touching your eyes, nose, or mouth with unwashed hands.  . Avoid close contact with people who are sick. . Avoid places or events with large numbers of people in one location, like concerts or sporting events. . Carefully consider travel plans you have or are making. . If you are planning any travel outside or inside the [place], visit the CDC's Travelers' Health webpage for the latest health notices. . If you have some symptoms but not all symptoms, continue to monitor at home and seek medical attention if your symptoms worsen. . If you are having a medical emergency, call 911.   ADDITIONAL HEALTHCARE OPTIONS FOR PATIENTS   Telehealth / e-Visit: [email or web address]         [facility]: [phone]  [facility]: [phone]                   [facility]: [phone]   

## 2018-12-14 NOTE — Progress Notes (Signed)
Pt presents today for f/u with Dr. Sondra Come. Pt ruminating about medication reconciliation. Pt denies c/o pain. Pt denies dysuria/hematuria. Pt reports vaginal burning, that has been present "many, many, many, many years". Pt denies vaginal bleeding/discharge. Pt reports Dr. Louis Meckel prescribed natural vitamin E suppository for vagina, pt is no longer using vitamin E. Pt reports using vaginal dilator. Pt denies rectal bleeding, diarrhea/constipation. Pt denies abdominal bloating, N/V.  BP (!) 160/73 (BP Location: Left Arm, Patient Position: Sitting, Cuff Size: Normal)   Pulse 100   Temp 98.2 F (36.8 C)   Resp 20   Wt 114 lb 12.8 oz (52.1 kg)   SpO2 100%   BMI 22.42 kg/m   Wt Readings from Last 3 Encounters:  12/14/18 114 lb 12.8 oz (52.1 kg)  08/24/18 114 lb (51.7 kg)  07/09/18 115 lb 2 oz (52.2 kg)   Loma Sousa, RN BSN

## 2018-12-14 NOTE — Progress Notes (Signed)
Radiation Oncology         [phone] ________________________________  Name: Jamie Macdonald MRN: [id number]  Date: 12/14/2018  DOB: 05/18/1945  Follow-Up Visit Note  CC: Leighton Ruff, MD  Leighton Ruff, MD    ICD-10-CM   1. Endometrial cancer (HCC)  C54.1     Diagnosis:  FIGO stage IA grade II endometrioid adenocarcinoma  Interval Since Last Radiation: Six months and one week.  HDR Radiation treatment dates:  05/07/2018, 05/18/2018, 05/21/2018, 05/28/2018, 06/04/2018  Site/dose: Vagina, prescription to the mucosal surface / 30 Gy in 5 fractions of 6 Gy  Narrative:  The patient returns today for routine follow-up. Tthe patient followed up with Dr. Denman George on 08/24/2018. No significant findings at that time.  On review of systems, the patient reports no further spotting with vaginal dilator use. She denies pelvic pain or abdominal bloating and any other symptoms.   ALLERGIES:  is allergic to nsaids and estrace [estradiol].  Meds: Current Outpatient Medications  Medication Sig Dispense Refill  . ACCU-CHEK AVIVA PLUS test strip     . Accu-Chek Softclix Lancets lancets     . Blood Glucose Monitoring Suppl (ACCU-CHEK AVIVA PLUS) w/Device KIT     . Flax Oil-Fish Oil-Borage Oil (FISH OIL-FLAX OIL-BORAGE OIL PO) Take 1 capsule by mouth daily.     Marland Kitchen losartan (COZAAR) 50 MG tablet Take by mouth.    . metFORMIN (GLUCOPHAGE) 500 MG tablet Take 500 mg by mouth 2 (two) times daily with a meal.     . Multiple Vitamins-Minerals (GNP DIABETIC SUPPORT FORMULA) TABS Take 2 tablets by mouth.    . Cholecalciferol (VITAMIN D) 50 MCG (2000 UT) tablet Take 2,000 Units by mouth daily.    Marland Kitchen estradiol (ESTRACE) 0.1 MG/GM vaginal cream Insert a pea size amount in the vagina at bedtime every other night for 6 weeks (Patient not taking: Reported on 07/09/2018) 42.5 g 1  . lisinopril (PRINIVIL,ZESTRIL) 10 MG tablet Take 10 mg by mouth daily.    . Multiple Vitamins-Minerals (MULTIVITAMIN ADULT PO)  Take 1 tablet by mouth daily.      No current facility-administered medications for this encounter.     Physical Findings: The patient is in no acute distress. Patient is alert and oriented.  weight is 114 lb 12.8 oz (52.1 kg). Her temperature is 98.2 F (36.8 C). Her blood pressure is 160/73 (abnormal) and her pulse is 100. Her respiration is 20 and oxygen saturation is 100%.   Lungs are clear to auscultation bilaterally. Heart has regular rate and rhythm. No palpable cervical, supraclavicular, or axillary adenopathy. Abdomen soft, non-tender, normal bowel sounds. On pelvic examination the external genitalia were unremarkable. A speculum exam was performed. There are no mucosal lesions noted in the vaginal vault.  On bimanual examination the vaginal cuff  is intact.  No pelvic masses appreciated.  Some sutures palpable in the upper vaginal region. Lab Findings: Lab Results  Component Value Date   WBC 4.6 04/15/2018   HGB 10.7 (L) 04/15/2018   HCT 32.6 (L) 04/15/2018   MCV 88.8 04/15/2018   PLT 230 04/15/2018    Radiographic Findings: No results found.  Impression:  FIGO stage IA grade II endometrioid adenocarcinoma.  No evidence of recurrence on clinical exam today.  Plan:  Patient will follow up with Dr. Denman George in three months and radiation oncology in 6 months.  She is due for mammograms and will follow up with Dr. Drema Dallas her primary care physician to get this scheduled.  ____________________________________  Blair Promise, PhD, MD  This document serves as a record of services personally performed by Gery Pray, MD. It was created on his behalf by Clerance Lav, a trained medical scribe. The creation of this record is based on the scribe's personal observations and the provider's statements to them. This document has been checked and approved by the attending provider.

## 2018-12-15 DIAGNOSIS — N644 Mastodynia: Secondary | ICD-10-CM | POA: Diagnosis not present

## 2018-12-15 DIAGNOSIS — M810 Age-related osteoporosis without current pathological fracture: Secondary | ICD-10-CM | POA: Diagnosis not present

## 2018-12-15 DIAGNOSIS — E1122 Type 2 diabetes mellitus with diabetic chronic kidney disease: Secondary | ICD-10-CM | POA: Diagnosis not present

## 2018-12-15 DIAGNOSIS — E559 Vitamin D deficiency, unspecified: Secondary | ICD-10-CM | POA: Diagnosis not present

## 2018-12-15 DIAGNOSIS — Z7984 Long term (current) use of oral hypoglycemic drugs: Secondary | ICD-10-CM | POA: Diagnosis not present

## 2018-12-15 DIAGNOSIS — N183 Chronic kidney disease, stage 3 unspecified: Secondary | ICD-10-CM | POA: Diagnosis not present

## 2018-12-15 DIAGNOSIS — I10 Essential (primary) hypertension: Secondary | ICD-10-CM | POA: Diagnosis not present

## 2018-12-17 ENCOUNTER — Other Ambulatory Visit: Payer: Self-pay | Admitting: Family Medicine

## 2018-12-17 DIAGNOSIS — N644 Mastodynia: Secondary | ICD-10-CM

## 2018-12-18 DIAGNOSIS — E559 Vitamin D deficiency, unspecified: Secondary | ICD-10-CM | POA: Diagnosis not present

## 2018-12-18 DIAGNOSIS — Z7984 Long term (current) use of oral hypoglycemic drugs: Secondary | ICD-10-CM | POA: Diagnosis not present

## 2018-12-18 DIAGNOSIS — N644 Mastodynia: Secondary | ICD-10-CM | POA: Diagnosis not present

## 2018-12-18 DIAGNOSIS — E1122 Type 2 diabetes mellitus with diabetic chronic kidney disease: Secondary | ICD-10-CM | POA: Diagnosis not present

## 2018-12-18 DIAGNOSIS — I10 Essential (primary) hypertension: Secondary | ICD-10-CM | POA: Diagnosis not present

## 2018-12-18 DIAGNOSIS — N183 Chronic kidney disease, stage 3 unspecified: Secondary | ICD-10-CM | POA: Diagnosis not present

## 2018-12-18 DIAGNOSIS — R7989 Other specified abnormal findings of blood chemistry: Secondary | ICD-10-CM | POA: Diagnosis not present

## 2018-12-28 ENCOUNTER — Ambulatory Visit: Payer: Medicare HMO

## 2018-12-28 ENCOUNTER — Ambulatory Visit
Admission: RE | Admit: 2018-12-28 | Discharge: 2018-12-28 | Disposition: A | Discharge status: Home / Self Care | Payer: Medicare HMO | Source: Ambulatory Visit | Attending: Family Medicine | Admitting: Family Medicine

## 2018-12-28 ENCOUNTER — Other Ambulatory Visit: Payer: Self-pay

## 2018-12-28 DIAGNOSIS — R922 Inconclusive mammogram: Secondary | ICD-10-CM | POA: Diagnosis not present

## 2018-12-28 DIAGNOSIS — N644 Mastodynia: Secondary | ICD-10-CM

## 2019-01-05 ENCOUNTER — Telehealth: Payer: Self-pay | Admitting: *Deleted

## 2019-01-05 NOTE — Telephone Encounter (Signed)
Scheduled the patient for a follow up appt in February

## 2019-02-17 ENCOUNTER — Telehealth: Payer: Self-pay | Admitting: *Deleted

## 2019-02-17 NOTE — Telephone Encounter (Signed)
Spoke with Ms Bushnell and told her the information as noted below from [employer].

## 2019-02-17 NOTE — Telephone Encounter (Signed)
Jamie Gibbs, NP  Baruch Merl, RN  Caller: Unspecified (Today, 9:25 AM)  From the cancer standpoint, she is fine to proceed with jury duty. If she feels her other medical issues managed by her PCP are an issue that limits her from jury duty, she should reach out to her PCP. Thank you!

## 2019-02-17 NOTE — Telephone Encounter (Signed)
Patient called and left a message stating "I have an appt with Dr Denman George on 2/23; I also have jury duty this month. In this letter with the jury duty it states that if you are 21 years+ and have health concerns you can be exempted. I am over 74 years old and have health concerns; I'm considered high risk. I wanted Dr Denman George to write a letter to send back with this jury duty letter says I'm exempted. The date for the jury duty is 2/23."   Called the patient and explained that we received her message and forwarded it to Dr Denman George and Lenna Sciara APP. Explained that we would call her back

## 2019-03-09 ENCOUNTER — Encounter: Payer: Self-pay | Admitting: Gynecologic Oncology

## 2019-03-09 ENCOUNTER — Other Ambulatory Visit: Payer: Self-pay

## 2019-03-09 ENCOUNTER — Inpatient Hospital Stay: Payer: Medicare HMO | Attending: Gynecologic Oncology | Admitting: Gynecologic Oncology

## 2019-03-09 VITALS — BP 165/79 | HR 92 | Temp 98.7°F | Resp 16 | Ht 60.0 in | Wt 127.4 lb

## 2019-03-09 DIAGNOSIS — Z90710 Acquired absence of both cervix and uterus: Secondary | ICD-10-CM | POA: Diagnosis not present

## 2019-03-09 DIAGNOSIS — Z79899 Other long term (current) drug therapy: Secondary | ICD-10-CM | POA: Diagnosis not present

## 2019-03-09 DIAGNOSIS — F329 Major depressive disorder, single episode, unspecified: Secondary | ICD-10-CM | POA: Diagnosis not present

## 2019-03-09 DIAGNOSIS — E119 Type 2 diabetes mellitus without complications: Secondary | ICD-10-CM | POA: Insufficient documentation

## 2019-03-09 DIAGNOSIS — Z7984 Long term (current) use of oral hypoglycemic drugs: Secondary | ICD-10-CM | POA: Diagnosis not present

## 2019-03-09 DIAGNOSIS — F419 Anxiety disorder, unspecified: Secondary | ICD-10-CM | POA: Diagnosis not present

## 2019-03-09 DIAGNOSIS — Z90722 Acquired absence of ovaries, bilateral: Secondary | ICD-10-CM | POA: Diagnosis not present

## 2019-03-09 DIAGNOSIS — I10 Essential (primary) hypertension: Secondary | ICD-10-CM | POA: Diagnosis not present

## 2019-03-09 DIAGNOSIS — N183 Chronic kidney disease, stage 3 unspecified: Secondary | ICD-10-CM | POA: Diagnosis not present

## 2019-03-09 DIAGNOSIS — C541 Malignant neoplasm of endometrium: Secondary | ICD-10-CM | POA: Diagnosis not present

## 2019-03-09 DIAGNOSIS — I129 Hypertensive chronic kidney disease with stage 1 through stage 4 chronic kidney disease, or unspecified chronic kidney disease: Secondary | ICD-10-CM | POA: Insufficient documentation

## 2019-03-09 DIAGNOSIS — E1122 Type 2 diabetes mellitus with diabetic chronic kidney disease: Secondary | ICD-10-CM | POA: Insufficient documentation

## 2019-03-09 NOTE — Progress Notes (Signed)
Follow-up Note: Gyn-Onc  Consult was initially requested by Dr. Nelda Marseille for the evaluation of Jamie Macdonald 74 y.o. female  CC:  Chief Complaint  Patient presents with  . endometrial cancer    Assessment/Plan:  Jamie Macdonald  is a 74 y.o.  year old with a history stage endometrioid endometrial adenocarcinoma, and vaginal prolapse, s/p staging and abdominal sacrocolpopexy. High/intermediate risk factors for recurrence. S/p vaginal brachytherapy completed 06/04/18.  No evidence of recurrence. There is some palpable mesh at the posterior upper vaginal wall. This is asymptomatic for the patient.   Plan is for follow-up with Dr Sondra Come in 3 months and myself in 6 months.   HPI: Jamie Macdonald is a 74 year old woman who is seen in consultation at the request of Dr Nelda Marseille for grade 1 endometrial cancer.   The patient reports a history of occasional bleeding symptoms since the beginning of November 2019.  She was on estradiol while using her pessary which had been placed in 2019 for severe pelvic organ prolapse.  She was evaluated by Dr. Nelda Marseille who performed a D&C on January 30, 2018.  This revealed well differentiated endometrial adenocarcinoma with foci approaching moderate differentiation.  The patient has a history of severe pelvic organ prolapse that is symptomatic.  She saw Dr. Maryland Pink in May 2018 who prescribed vaginal estrogen.  She then was seen back later in the year and recommended hysterectomy with a tacking procedure.  The patient was concerned about the use of vaginal mesh and therefore declined at this time.  She subsequently saw Dr. Baruch Goldmann who placed a vaginal pessary which does help with her symptoms of prolapse however the patient does not like having to wear a pessary and desires definitive surgical management.  Her medical history is significant for very well controlled type 2 diabetes mellitus.  She regular checks her blood glucose levels and takes oral agent (metformin) but no  insulin for this.  Her last hemoglobin A1c was less than 6%.  She also has hypertension and some anxiety and depression.  Her surgical history is significant for a surgical cone and a tubal ligation but no other major abdominal surgeries.  Her family history is unremarkable for malignancy.  On 03/19/18 she underwent a robotic assisted total hysterectomy BSO, SLN biopsy and abdominal sacrocolpopexy. Postoperatively she had a prolonged hospitalization due to self-induced hyponatremia due to water intoxication.   Final pathology revealed stage IA grade 2 endometrioid endometrial cancer with 1 mm depth of invasion total thickness greater than 30 mm.  This represented less than one half.  No lymphovascular space invasion.  Negative sentinel lymph nodes, cervix, right adnexa.  Tumor measured 1.4 cm.  It was a FIGO grade 2.  Due to her age of 47 and a FIGO grade 2 cancer, she meets high intermediate risk factor criteria. She was determined to have high intermediate risk features and therefore vaginal brachytherapy was recommended in accordance with NCCN guidelines to minimize local recurrence.   Interval Hx:  The patient received a total of 30 Gy vaginal brachii therapy delivered in 5 fractions of 6 Gy.  Therapy was conducted between May 07, 2018 and completed Jun 04, 2018. Jayani reports occasional blood with using the dilator.  She reports that she is using it 3 times a week.  Current Meds:  Outpatient Encounter Medications as of 03/09/2019  Medication Sig  . ACCU-CHEK AVIVA PLUS test strip   . Accu-Chek Softclix Lancets lancets   . Blood Glucose  Monitoring Suppl (ACCU-CHEK AVIVA PLUS) w/Device KIT   . Flax Oil-Fish Oil-Borage Oil (FISH OIL-FLAX OIL-BORAGE OIL PO) Take 1 capsule by mouth daily.   Marland Kitchen lisinopril (PRINIVIL,ZESTRIL) 10 MG tablet Take 10 mg by mouth daily.  Marland Kitchen losartan (COZAAR) 50 MG tablet Take by mouth.  . metFORMIN (GLUCOPHAGE) 500 MG tablet Take 500 mg by mouth 2 (two) times daily with a  meal.   . Multiple Vitamins-Minerals (GNP DIABETIC SUPPORT FORMULA) TABS Take 2 tablets by mouth.  . Multiple Vitamins-Minerals (MULTIVITAMIN ADULT PO) Take 1 tablet by mouth daily.   . [DISCONTINUED] Cholecalciferol (VITAMIN D) 50 MCG (2000 UT) tablet Take 2,000 Units by mouth daily.  . [DISCONTINUED] estradiol (ESTRACE) 0.1 MG/GM vaginal cream Insert a pea size amount in the vagina at bedtime every other night for 6 weeks (Patient not taking: Reported on 07/09/2018)   No facility-administered encounter medications on file as of 03/09/2019.    Allergy:  Allergies  Allergen Reactions  . Nsaids Other (See Comments)    Due to chronic kidney disease pt does not take any celebrex, motrin, aleve etc.  . Estrace [Estradiol]     Social Hx:   Social History   Socioeconomic History  . Marital status: Divorced    Spouse name: Not on file  . Number of children: Not on file  . Years of education: Not on file  . Highest education level: Not on file  Occupational History  . Not on file  Tobacco Use  . Smoking status: Never Smoker  . Smokeless tobacco: Never Used  Substance and Sexual Activity  . Alcohol use: No  . Drug use: Never  . Sexual activity: Not on file  Other Topics Concern  . Not on file  Social History Narrative  . Not on file   Social Determinants of Health   Financial Resource Strain:   . Difficulty of Paying Living Expenses: Not on file  Food Insecurity:   . Worried About Charity fundraiser in the Last Year: Not on file  . Ran Out of Food in the Last Year: Not on file  Transportation Needs:   . Lack of Transportation (Medical): Not on file  . Lack of Transportation (Non-Medical): Not on file  Physical Activity:   . Days of Exercise per Week: Not on file  . Minutes of Exercise per Session: Not on file  Stress:   . Feeling of Stress : Not on file  Social Connections:   . Frequency of Communication with Friends and Family: Not on file  . Frequency of Social  Gatherings with Friends and Family: Not on file  . Attends Religious Services: Not on file  . Active Member of Clubs or Organizations: Not on file  . Attends Archivist Meetings: Not on file  . Marital Status: Not on file  Intimate Partner Violence:   . Fear of Current or Ex-Partner: Not on file  . Emotionally Abused: Not on file  . Physically Abused: Not on file  . Sexually Abused: Not on file    Past Surgical Hx:  Past Surgical History:  Procedure Laterality Date  . CERVICAL CONING  74 OR 75  . DILATATION & CURETTAGE/HYSTEROSCOPY WITH MYOSURE N/A 01/30/2018   Procedure: DILATATION & CURETTAGE/HYSTEROSCOPY WITH MYOSURE;  Surgeon: Janyth Pupa, DO;  Location: [facility];  Service: Gynecology;  Laterality: N/A;  . ROBOTIC ASSISTED LAPAROSCOPIC SACROCOLPOPEXY N/A 03/19/2018   Procedure: XI ROBOTIC ASSISTED LAPAROSCOPIC SACROCOLPOPEXY;  Surgeon: Ardis Hughs, MD;  Location: [facility];  Service: Urology;  Laterality: N/A;  . ROBOTIC ASSISTED TOTAL HYSTERECTOMY WITH BILATERAL SALPINGO OOPHERECTOMY Bilateral 03/19/2018   Procedure: XI ROBOTIC ASSISTED TOTAL HYSTERECTOMY WITH BILATERAL SALPINGO OOPHORECTOMY;  Surgeon: Everitt Amber, MD;  Location: [facility];  Service: Gynecology;  Laterality: Bilateral;  . SENTINEL NODE BIOPSY Bilateral 03/19/2018   Procedure: SENTINEL NODE BIOPSY;  Surgeon: Everitt Amber, MD;  Location: [facility];  Service: Gynecology;  Laterality: Bilateral;    Past Medical Hx:  Past Medical History:  Diagnosis Date  . Anemia   . Cancer [facility])    ENDOMETRIAL CANCER  . Chronic kidney disease    ckd STAGE 3LOV DR Janae Sauce NEPHROLOGY 12-12-17  . Diabetes mellitus   . Diabetes mellitus type 2 in nonobese ([place]) 01/16/2018  . Essential hypertension 01/16/2018  . Exposure to hepatitis C    AS CHILD  . Exposure to TB 1973   AS CHILD  . Fatty tumor    LEFT SHOULDER REMOVED  . Fibroids   . Hypertension    SLIGHT  . Uterine prolapse 01/16/2018  . UTI (urinary tract  infection)    STARTED AMOXICILLIAN FRIDAY 03-13-2018    Past Gynecological History:  See HPI No LMP recorded. Patient is postmenopausal.  Family Hx:  Family History  Problem Relation Age of Onset  . Stroke Mother   . Parkinson's disease Mother   . Heart disease Father        HEART ATTACK  . Diabetes Father   . Prostate cancer Father   . Diabetes Sister   . Cancer Sister     Review of Systems:  Constitutional  Feels well,    ENT Normal appearing ears and nares bilaterally Skin/Breast  No rash, sores, jaundice, itching, dryness Cardiovascular  No chest pain, shortness of breath, or edema  Pulmonary  No cough or wheeze.  Gastro Intestinal  No nausea, vomitting, or diarrhoea. No bright red blood per rectum, no abdominal pain, change in bowel movement, or constipation.  Genito Urinary  No frequency, urgency, dysuria, no bleeding Musculo Skeletal  No myalgia, arthralgia, joint swelling or pain  Neurologic  No weakness, numbness, change in gait,  Psychology  No depression, anxiety, insomnia.   Vitals:  Blood pressure (!) 165/79, pulse 92, temperature 98.7 F (37.1 C), temperature source Temporal, resp. rate 16, height 5' (1.524 m), weight 127 lb 6.4 oz (57.8 kg), SpO2 100 %.  Physical Exam: WD in NAD Neck  Supple NROM, without any enlargements.  Lymph Node Survey No cervical supraclavicular or inguinal adenopathy Cardiovascular  Pulse normal rate, regularity and rhythm. S1 and S2 normal.  Lungs  Clear to auscultation bilateraly, without wheezes/crackles/rhonchi. Good air movement.  Skin  No rash/lesions/breakdown  Psychiatry  Alert and oriented to person, place, and time  Abdomen  Normoactive bowel sounds, abdomen soft, non-tender and not obese without evidence of hernia. Incisions soft Back No CVA tenderness Genito Urinary  Posterior right vaginal cuff has visible suture and palpable mesh submucosally.  Rectal  deferred Extremities  No bilateral cyanosis,  clubbing or edema.   Thereasa Solo, MD  03/09/2019, 1:31 PM

## 2019-03-09 NOTE — Patient Instructions (Signed)
Please notify Dr Denman George at phone number [phone] if you notice vaginal bleeding, new pelvic or abdominal pains, bloating, feeling full easy, or a change in bladder or bowel function.   Please have Dr Clabe Seal office contact Dr Serita Grit office (at [phone]) in May after your visit to request an appointment with her for August, 2021.

## 2019-03-23 DIAGNOSIS — N819 Female genital prolapse, unspecified: Secondary | ICD-10-CM | POA: Diagnosis not present

## 2019-04-06 DIAGNOSIS — N8110 Cystocele, unspecified: Secondary | ICD-10-CM | POA: Diagnosis not present

## 2019-05-25 DIAGNOSIS — N1832 Chronic kidney disease, stage 3b: Secondary | ICD-10-CM | POA: Diagnosis not present

## 2019-05-31 DIAGNOSIS — E1122 Type 2 diabetes mellitus with diabetic chronic kidney disease: Secondary | ICD-10-CM | POA: Diagnosis not present

## 2019-05-31 DIAGNOSIS — N183 Chronic kidney disease, stage 3 unspecified: Secondary | ICD-10-CM | POA: Diagnosis not present

## 2019-05-31 DIAGNOSIS — N1832 Chronic kidney disease, stage 3b: Secondary | ICD-10-CM | POA: Diagnosis not present

## 2019-05-31 DIAGNOSIS — I129 Hypertensive chronic kidney disease with stage 1 through stage 4 chronic kidney disease, or unspecified chronic kidney disease: Secondary | ICD-10-CM | POA: Diagnosis not present

## 2019-06-09 NOTE — Progress Notes (Signed)
Patient is here today for a 6 month follow up visit with Dr. Sondra Come.   Pain:  Fatigue:  Nausea, vomiting, diarrhea:  Bladder issues:  Vaginal or rectal bleeding:  Skin irritation:  Vs  wt

## 2019-06-10 ENCOUNTER — Ambulatory Visit
Admission: RE | Admit: 2019-06-10 | Discharge: 2019-06-10 | Disposition: A | Discharge status: Home / Self Care | Payer: Medicare HMO | Source: Ambulatory Visit | Attending: Radiation Oncology | Admitting: Radiation Oncology

## 2019-06-10 ENCOUNTER — Telehealth: Payer: Self-pay | Admitting: *Deleted

## 2019-06-10 NOTE — Telephone Encounter (Signed)
CALLED PATIENT TO RESCHEDULE FU FOR Jun 12, 2019 DUE TO A DEATH IN HER FAMILY, SPOKE WITH PATIENT AND SHE HAS BEEN RESCHEDULED FOR 08-09-19 @ 10 AM , PER PATIENT REQUEST

## 2019-06-15 ENCOUNTER — Telehealth: Payer: Self-pay | Admitting: *Deleted

## 2019-06-15 ENCOUNTER — Telehealth: Payer: Self-pay

## 2019-06-15 NOTE — Telephone Encounter (Signed)
Patient called requesting a f/u visit sooner than 08/09/19. She had to cancel her appt. Last week and does not want to wait that much longer to be seen. Advised will ask our secretary, Enid Derry to call her back and see what is available.

## 2019-06-15 NOTE — Telephone Encounter (Signed)
RETURNED PATIENT'S PHONE CALL, SPOKE WITH PATIENT AND RESCHEDULED 08/09/19 FU FOR 07/22/19 @ 8 AM, PATIENT AGREED TO NEW DATE AND TIME

## 2019-07-21 NOTE — Progress Notes (Signed)
Radiation Oncology         [phone] ________________________________  Name: Jamie Macdonald MRN: [id number]  Date: 07/22/2019  DOB: [birth year]  Follow-Up Visit Note  CC: Leighton Ruff, MD  Leighton Ruff, MD    ICD-10-CM   1. Endometrial cancer (HCC)  C54.1     Diagnosis:  FIGO stage IA grade II endometrioid adenocarcinoma  Interval Since Last Radiation: One year, one month, two weeks, and three days.  HDR Radiation treatment dates:  05/07/2018, 05/18/2018, 05/21/2018, 05/28/2018, 06/04/2018  Site/dose: Vagina, prescription to the mucosal surface / 30 Gy in 5 fractions of 6 Gy  Narrative:  The patient returns today for routine follow-up. She was supposed to be seen on 06/10/2019 but had to reschedule secondary to a death in her family. In the interval, she underwent a bilateral diagnostic mammogram on 12/28/2018 that did not show any mammographic evidence of malignancy.  She was seen by Dr. Denman George on 03/09/2019. At that time, there was no evidence of recurrence. There was some palpable mesh at the posterior upper vaginal wall, but was asymptomatic for the patient.  On review of systems, she reports using her vaginal dilator 3 times a week.  She denies any bleeding when using the dilator.. She denies abdominal bloating or pelvic pain.  She denies any hematuria or rectal bleeding.  The patient reports seeing urology over the past 2 to 3 months..  ALLERGIES:  is allergic to nsaids and estrace [estradiol].  Meds: Current Outpatient Medications  Medication Sig Dispense Refill  . ACCU-CHEK AVIVA PLUS test strip     . Accu-Chek Softclix Lancets lancets     . Blood Glucose Monitoring Suppl (ACCU-CHEK AVIVA PLUS) w/Device KIT     . Flax Oil-Fish Oil-Borage Oil (FISH OIL-FLAX OIL-BORAGE OIL PO) Take 1 capsule by mouth daily.     Marland Kitchen lisinopril (PRINIVIL,ZESTRIL) 10 MG tablet Take 10 mg by mouth daily.    Marland Kitchen losartan (COZAAR) 50 MG tablet Take by mouth.    . metFORMIN (GLUCOPHAGE)  500 MG tablet Take 500 mg by mouth 2 (two) times daily with a meal.     . Multiple Vitamins-Minerals (GNP DIABETIC SUPPORT FORMULA) TABS Take 2 tablets by mouth.    . Multiple Vitamins-Minerals (MULTIVITAMIN ADULT PO) Take 1 tablet by mouth daily.     . vitamin E (VITAMIN E) 180 MG (400 UNITS) capsule Take 400 Units by mouth daily.     No current facility-administered medications for this encounter.    Physical Findings: The patient is in no acute distress. Patient is alert and oriented.  height is 5' (1.524 m) and weight is 120 lb 2 oz (54.5 kg). Her oral temperature is 98.5 F (36.9 C). Her blood pressure is 147/74 (abnormal) and her pulse is 83. Her respiration is 18 and oxygen saturation is 99%.   Lungs are clear to auscultation bilaterally. Heart has regular rate and rhythm. No palpable cervical, supraclavicular, or axillary adenopathy. Abdomen soft, non-tender, normal bowel sounds. On pelvic examination the external genitalia were unremarkable. A speculum exam was performed. There are no mucosal lesions noted in the vaginal vault. On bimanual examination the vaginal cuff is intact. No pelvic masses appreciated.  Dried Blood noted in the right vaginal apex.  The right vaginal cuff area has visible suture present and palpable mesh noted submucosally.  Lab Findings: Lab Results  Component Value Date   WBC 4.6 04/15/2018   HGB 10.7 (L) 04/15/2018   HCT 32.6 (L) 04/15/2018   MCV  88.8 04/15/2018   PLT 230 04/15/2018    Radiographic Findings: No results found.  Impression:  FIGO stage IA grade II endometrioid adenocarcinoma  No evidence of recurrence on clinical exam today.   Plan:  The patient will follow up with Dr. Denman George in three months and with radiation oncology in six months.   Total time spent in this encounter was 25 minutes which included reviewing the patient's most recent mammogram, follow-up, physical examination, and documentation as well as extensive discussion  concerning the patient's multiple questions.  ____________________________________  Blair Promise, PhD, MD  This document serves as a record of services personally performed by Gery Pray, MD. It was created on his behalf by Clerance Lav, a trained medical scribe. The creation of this record is based on the scribe's personal observations and the provider's statements to them. This document has been checked and approved by the attending provider.

## 2019-07-22 ENCOUNTER — Ambulatory Visit
Admission: RE | Admit: 2019-07-22 | Discharge: 2019-07-22 | Disposition: A | Discharge status: Home / Self Care | Payer: Medicare HMO | Source: Ambulatory Visit | Attending: Radiation Oncology | Admitting: Radiation Oncology

## 2019-07-22 ENCOUNTER — Other Ambulatory Visit: Payer: Self-pay

## 2019-07-22 ENCOUNTER — Encounter: Payer: Self-pay | Admitting: Radiation Oncology

## 2019-07-22 DIAGNOSIS — Z08 Encounter for follow-up examination after completed treatment for malignant neoplasm: Secondary | ICD-10-CM | POA: Diagnosis not present

## 2019-07-22 DIAGNOSIS — Z923 Personal history of irradiation: Secondary | ICD-10-CM | POA: Insufficient documentation

## 2019-07-22 DIAGNOSIS — Z79899 Other long term (current) drug therapy: Secondary | ICD-10-CM | POA: Insufficient documentation

## 2019-07-22 DIAGNOSIS — Z8542 Personal history of malignant neoplasm of other parts of uterus: Secondary | ICD-10-CM | POA: Insufficient documentation

## 2019-07-22 DIAGNOSIS — Z7984 Long term (current) use of oral hypoglycemic drugs: Secondary | ICD-10-CM | POA: Insufficient documentation

## 2019-07-22 DIAGNOSIS — C541 Malignant neoplasm of endometrium: Secondary | ICD-10-CM | POA: Diagnosis not present

## 2019-07-22 NOTE — Progress Notes (Signed)
Patient here for a 3 month f/u. Patient denies any bleeding or discharge. She denies fatigue or pain. Reports she is feeling well and has several questions today.

## 2019-07-22 NOTE — Addendum Note (Signed)
Encounter addended by: De Burrs, RN on: 07/22/2019 9:20 AM  Actions taken: Order Reconciliation Section accessed, Order list changed

## 2019-07-30 ENCOUNTER — Telehealth: Payer: Self-pay | Admitting: *Deleted

## 2019-07-30 NOTE — Telephone Encounter (Signed)
CALLED PATIENT TO INFORM OF FU APPT. WITH DR. Denman George ON 11-01-19 - ARRIVAL TIME- 1 PM, SPOKE WITH PATIENT AND SHE IS AWARE OF THIS APPT.

## 2019-08-06 ENCOUNTER — Telehealth: Payer: Self-pay

## 2019-08-06 NOTE — Telephone Encounter (Signed)
TC to patient who was last seen on 07/22/2019. Patient asked why Dr. Sondra Come was checking under her " armpits" and was concerned. Patient advised that was routine and if he felt something abnormal he would tell her. Patient reassured.

## 2019-08-09 ENCOUNTER — Ambulatory Visit: Payer: Self-pay | Admitting: Radiation Oncology

## 2019-09-21 DIAGNOSIS — E559 Vitamin D deficiency, unspecified: Secondary | ICD-10-CM | POA: Diagnosis not present

## 2019-09-21 DIAGNOSIS — Z7984 Long term (current) use of oral hypoglycemic drugs: Secondary | ICD-10-CM | POA: Diagnosis not present

## 2019-09-21 DIAGNOSIS — M810 Age-related osteoporosis without current pathological fracture: Secondary | ICD-10-CM | POA: Diagnosis not present

## 2019-09-21 DIAGNOSIS — N183 Chronic kidney disease, stage 3 unspecified: Secondary | ICD-10-CM | POA: Diagnosis not present

## 2019-09-21 DIAGNOSIS — I10 Essential (primary) hypertension: Secondary | ICD-10-CM | POA: Diagnosis not present

## 2019-09-21 DIAGNOSIS — E1122 Type 2 diabetes mellitus with diabetic chronic kidney disease: Secondary | ICD-10-CM | POA: Diagnosis not present

## 2019-09-30 DIAGNOSIS — H5213 Myopia, bilateral: Secondary | ICD-10-CM | POA: Diagnosis not present

## 2019-09-30 DIAGNOSIS — Z0100 Encounter for examination of eyes and vision without abnormal findings: Secondary | ICD-10-CM | POA: Diagnosis not present

## 2019-10-07 DIAGNOSIS — Z23 Encounter for immunization: Secondary | ICD-10-CM | POA: Diagnosis not present

## 2019-10-19 DIAGNOSIS — N819 Female genital prolapse, unspecified: Secondary | ICD-10-CM | POA: Diagnosis not present

## 2019-10-20 DIAGNOSIS — E1122 Type 2 diabetes mellitus with diabetic chronic kidney disease: Secondary | ICD-10-CM | POA: Diagnosis not present

## 2019-10-28 ENCOUNTER — Telehealth: Payer: Self-pay | Admitting: *Deleted

## 2019-10-28 NOTE — Telephone Encounter (Signed)
Patient called and stated that "I would like to talk with Dr Denman George before she does an exam on 10/18. I'm having problem with the mesh and other issues."

## 2019-11-01 ENCOUNTER — Telehealth: Payer: Self-pay | Admitting: *Deleted

## 2019-11-01 ENCOUNTER — Ambulatory Visit: Payer: Medicare HMO | Admitting: Gynecologic Oncology

## 2019-11-01 DIAGNOSIS — C541 Malignant neoplasm of endometrium: Secondary | ICD-10-CM

## 2019-11-01 NOTE — Telephone Encounter (Signed)
Patient called and rescheduled her appt from today to Wednesday

## 2019-11-03 ENCOUNTER — Other Ambulatory Visit: Payer: Self-pay

## 2019-11-03 ENCOUNTER — Encounter: Payer: Self-pay | Admitting: Gynecologic Oncology

## 2019-11-03 ENCOUNTER — Inpatient Hospital Stay: Payer: Medicare HMO | Attending: Gynecologic Oncology | Admitting: Gynecologic Oncology

## 2019-11-03 VITALS — BP 163/73 | HR 88 | Temp 97.3°F | Resp 16 | Wt 121.3 lb

## 2019-11-03 DIAGNOSIS — Z8542 Personal history of malignant neoplasm of other parts of uterus: Secondary | ICD-10-CM | POA: Insufficient documentation

## 2019-11-03 DIAGNOSIS — Z79899 Other long term (current) drug therapy: Secondary | ICD-10-CM | POA: Diagnosis not present

## 2019-11-03 DIAGNOSIS — Z7984 Long term (current) use of oral hypoglycemic drugs: Secondary | ICD-10-CM | POA: Diagnosis not present

## 2019-11-03 DIAGNOSIS — E1122 Type 2 diabetes mellitus with diabetic chronic kidney disease: Secondary | ICD-10-CM | POA: Diagnosis not present

## 2019-11-03 DIAGNOSIS — Z90722 Acquired absence of ovaries, bilateral: Secondary | ICD-10-CM | POA: Diagnosis not present

## 2019-11-03 DIAGNOSIS — C541 Malignant neoplasm of endometrium: Secondary | ICD-10-CM

## 2019-11-03 DIAGNOSIS — Z87440 Personal history of urinary (tract) infections: Secondary | ICD-10-CM | POA: Diagnosis not present

## 2019-11-03 DIAGNOSIS — Z08 Encounter for follow-up examination after completed treatment for malignant neoplasm: Secondary | ICD-10-CM | POA: Diagnosis not present

## 2019-11-03 DIAGNOSIS — I129 Hypertensive chronic kidney disease with stage 1 through stage 4 chronic kidney disease, or unspecified chronic kidney disease: Secondary | ICD-10-CM | POA: Insufficient documentation

## 2019-11-03 DIAGNOSIS — Z90710 Acquired absence of both cervix and uterus: Secondary | ICD-10-CM | POA: Insufficient documentation

## 2019-11-03 DIAGNOSIS — N183 Chronic kidney disease, stage 3 unspecified: Secondary | ICD-10-CM | POA: Diagnosis not present

## 2019-11-03 DIAGNOSIS — Z923 Personal history of irradiation: Secondary | ICD-10-CM | POA: Diagnosis not present

## 2019-11-03 NOTE — Patient Instructions (Signed)
Dr Denman George did not see any evidence of cancer on today's exam.  Please return to see Dr Sondra Come on January 10th, as scheduled.  Please have Dr Clabe Seal office contact Dr Serita Grit office (at [phone]) in January after your appointment with him to request an appointment with Dr Denman George for April, 2022.

## 2019-11-03 NOTE — Progress Notes (Signed)
Follow-up Note: Gyn-Onc  Consult was initially requested by Dr. Nelda Marseille for the evaluation of Jamie Macdonald 74 y.o. female  CC:  Chief Complaint  Patient presents with  . Endometrial cancer    Assessment/Plan:  Jamie Macdonald  is a 74 y.o.  year old with a history stage endometrioid endometrial adenocarcinoma, and vaginal prolapse, s/p staging and abdominal sacrocolpopexy. High/intermediate risk factors for recurrence. S/p vaginal brachytherapy completed 06/04/18.  No evidence of recurrence. There is some palpable mesh at the posterior upper vaginal wall. This is asymptomatic for the patient.   Plan is for follow-up with Dr Sondra Come in 3 months and myself in 6 months.   HPI: Jamie Macdonald is a 74 year old woman who is seen in consultation at the request of Dr Nelda Marseille for grade 1 endometrial cancer.   The patient reports a history of occasional bleeding symptoms since the beginning of November 2019.  She was on estradiol while using her pessary which had been placed in 2019 for severe pelvic organ prolapse.  She was evaluated by Dr. Nelda Marseille who performed a D&C on January 30, 2018.  This revealed well differentiated endometrial adenocarcinoma with foci approaching moderate differentiation.  The patient has a history of severe pelvic organ prolapse that is symptomatic.  She saw Dr. [place] Pink in May 2018 who prescribed vaginal estrogen.  She then was seen back later in the year and recommended hysterectomy with a tacking procedure.  The patient was concerned about the use of vaginal mesh and therefore declined at this time.  She subsequently saw Dr. Baruch Goldmann who placed a vaginal pessary which does help with her symptoms of prolapse however the patient does not like having to wear a pessary and desires definitive surgical management.  On 03/19/18 she underwent a robotic assisted total hysterectomy BSO, SLN biopsy and abdominal sacrocolpopexy. Postoperatively she had a prolonged hospitalization due to  self-induced hyponatremia due to water intoxication.   Final pathology revealed stage IA grade 2 endometrioid endometrial cancer with 1 mm depth of invasion total thickness greater than 30 mm.  This represented less than one half.  No lymphovascular space invasion.  Negative sentinel lymph nodes, cervix, right adnexa.  Tumor measured 1.4 cm.  It was a FIGO grade 2.  Due to her age of 74 and a FIGO grade 2 cancer, she meets high intermediate risk factor criteria. She was determined to have high intermediate risk features and therefore vaginal brachytherapy was recommended in accordance with NCCN guidelines to minimize local recurrence.   Interval Hx:  The patient received a total of 30 Gy vaginal brachii therapy delivered in 5 fractions of 6 Gy.  Therapy was conducted between May 07, 2018 and completed Jun 04, 2018. Jamie Macdonald reports occasional blood with using the dilator.  She reports that she is using it 3 times a week.  She has no symptoms of recurrence.   Current Meds:  Outpatient Encounter Medications as of 11/03/2019  Medication Sig  . ACCU-CHEK AVIVA PLUS test strip   . Accu-Chek Softclix Lancets lancets   . Blood Glucose Monitoring Suppl (ACCU-CHEK AVIVA PLUS) w/Device KIT   . Flax Oil-Fish Oil-Borage Oil (FISH OIL-FLAX OIL-BORAGE OIL PO) Take 1 capsule by mouth daily.   Marland Kitchen losartan (COZAAR) 50 MG tablet Take by mouth.  . metFORMIN (GLUCOPHAGE) 500 MG tablet Take 500 mg by mouth 2 (two) times daily with a meal.   . Multiple Vitamins-Minerals (GNP DIABETIC SUPPORT FORMULA) TABS Take 2 tablets by mouth.  Marland Kitchen  Multiple Vitamins-Minerals (MULTIVITAMIN ADULT PO) Take 1 tablet by mouth daily.   . vitamin E (VITAMIN E) 180 MG (400 UNITS) capsule Take 400 Units by mouth daily.   No facility-administered encounter medications on file as of 11/03/2019.    Allergy:  Allergies  Allergen Reactions  . Nsaids Other (See Comments)    Due to chronic kidney disease pt does not take any celebrex,  motrin, aleve etc.  . Estrace [Estradiol]     Social Hx:   Social History   Socioeconomic History  . Marital status: Divorced    Spouse name: Not on file  . Number of children: Not on file  . Years of education: Not on file  . Highest education level: Not on file  Occupational History  . Not on file  Tobacco Use  . Smoking status: Never Smoker  . Smokeless tobacco: Never Used  Vaping Use  . Vaping Use: Never used  Substance and Sexual Activity  . Alcohol use: No  . Drug use: Never  . Sexual activity: Not on file  Other Topics Concern  . Not on file  Social History Narrative  . Not on file   Social Determinants of Health   Financial Resource Strain:   . Difficulty of Paying Living Expenses: Not on file  Food Insecurity:   . Worried About Charity fundraiser in the Last Year: Not on file  . Ran Out of Food in the Last Year: Not on file  Transportation Needs:   . Lack of Transportation (Medical): Not on file  . Lack of Transportation (Non-Medical): Not on file  Physical Activity:   . Days of Exercise per Week: Not on file  . Minutes of Exercise per Session: Not on file  Stress:   . Feeling of Stress : Not on file  Social Connections:   . Frequency of Communication with Friends and Family: Not on file  . Frequency of Social Gatherings with Friends and Family: Not on file  . Attends Religious Services: Not on file  . Active Member of Clubs or Organizations: Not on file  . Attends Archivist Meetings: Not on file  . Marital Status: Not on file  Intimate Partner Violence:   . Fear of Current or Ex-Partner: Not on file  . Emotionally Abused: Not on file  . Physically Abused: Not on file  . Sexually Abused: Not on file    Past Surgical Hx:  Past Surgical History:  Procedure Laterality Date  . CERVICAL CONING  74 OR 75  . DILATATION & CURETTAGE/HYSTEROSCOPY WITH MYOSURE N/A 01/30/2018   Procedure: DILATATION & CURETTAGE/HYSTEROSCOPY WITH MYOSURE;   Surgeon: Janyth Pupa, DO;  Location: [facility];  Service: Gynecology;  Laterality: N/A;  . ROBOTIC ASSISTED LAPAROSCOPIC SACROCOLPOPEXY N/A 03/19/2018   Procedure: XI ROBOTIC ASSISTED LAPAROSCOPIC SACROCOLPOPEXY;  Surgeon: Ardis Hughs, MD;  Location: [facility];  Service: Urology;  Laterality: N/A;  . ROBOTIC ASSISTED TOTAL HYSTERECTOMY WITH BILATERAL SALPINGO OOPHERECTOMY Bilateral 03/19/2018   Procedure: XI ROBOTIC ASSISTED TOTAL HYSTERECTOMY WITH BILATERAL SALPINGO OOPHORECTOMY;  Surgeon: Everitt Amber, MD;  Location: [facility];  Service: Gynecology;  Laterality: Bilateral;  . SENTINEL NODE BIOPSY Bilateral 03/19/2018   Procedure: SENTINEL NODE BIOPSY;  Surgeon: Everitt Amber, MD;  Location: [facility];  Service: Gynecology;  Laterality: Bilateral;    Past Medical Hx:  Past Medical History:  Diagnosis Date  . Anemia   . Cancer [facility])    ENDOMETRIAL CANCER  . Chronic kidney disease  ckd STAGE 3LOV DR [place] 12-12-17  . Diabetes mellitus   . Diabetes mellitus type 2 in nonobese ([place]) 01/16/2018  . Essential hypertension 01/16/2018  . Exposure to hepatitis C    AS CHILD  . Exposure to TB 1973   AS CHILD  . Fatty tumor    LEFT SHOULDER REMOVED  . Fibroids   . Hypertension    SLIGHT  . Uterine prolapse 01/16/2018  . UTI (urinary tract infection)    STARTED AMOXICILLIAN FRIDAY 03-13-2018    Past Gynecological History:  See HPI No LMP recorded. Patient is postmenopausal.  Family Hx:  Family History  Problem Relation Age of Onset  . Stroke Mother   . Parkinson's disease Mother   . Heart disease Father        HEART ATTACK  . Diabetes Father   . Prostate cancer Father   . Diabetes Sister   . Cancer Sister     Review of Systems:  Constitutional  Feels well,    ENT Normal appearing ears and nares bilaterally Skin/Breast  No rash, sores, jaundice, itching, dryness Cardiovascular  No chest pain, shortness of breath, or edema  Pulmonary  No cough or wheeze.   Gastro Intestinal  No nausea, vomitting, or diarrhoea. No bright red blood per rectum, no abdominal pain, change in bowel movement, or constipation.  Genito Urinary  No frequency, urgency, dysuria, no bleeding Musculo Skeletal  No myalgia, arthralgia, joint swelling or pain  Neurologic  No weakness, numbness, change in gait,  Psychology  No depression, anxiety, insomnia.   Vitals:  Blood pressure (!) 163/73, pulse 88, temperature (!) 97.3 F (36.3 C), temperature source Tympanic, resp. rate 16, weight 121 lb 4.1 oz (55 kg), SpO2 100 %.  Physical Exam: WD in NAD Neck  Supple NROM, without any enlargements.  Lymph Node Survey No cervical supraclavicular or inguinal adenopathy Cardiovascular  Pulse normal rate, regularity and rhythm. S1 and S2 normal.  Lungs  Clear to auscultation bilateraly, without wheezes/crackles/rhonchi. Good air movement.  Skin  No rash/lesions/breakdown  Psychiatry  Alert and oriented to person, place, and time  Abdomen  Normoactive bowel sounds, abdomen soft, non-tender and not obese without evidence of hernia. Incisions soft Back No CVA tenderness Genito Urinary  Posterior right vaginal cuff has visible suture and palpable mesh submucosally.  Rectal  deferred Extremities  No bilateral cyanosis, clubbing or edema.   Thereasa Solo, MD  11/03/2019, 4:11 PM

## 2019-11-22 DIAGNOSIS — N1832 Chronic kidney disease, stage 3b: Secondary | ICD-10-CM | POA: Diagnosis not present

## 2019-11-29 DIAGNOSIS — N183 Chronic kidney disease, stage 3 unspecified: Secondary | ICD-10-CM | POA: Diagnosis not present

## 2019-11-29 DIAGNOSIS — E1122 Type 2 diabetes mellitus with diabetic chronic kidney disease: Secondary | ICD-10-CM | POA: Diagnosis not present

## 2019-11-29 DIAGNOSIS — N1832 Chronic kidney disease, stage 3b: Secondary | ICD-10-CM | POA: Diagnosis not present

## 2019-11-29 DIAGNOSIS — I129 Hypertensive chronic kidney disease with stage 1 through stage 4 chronic kidney disease, or unspecified chronic kidney disease: Secondary | ICD-10-CM | POA: Diagnosis not present

## 2019-11-29 DIAGNOSIS — N644 Mastodynia: Secondary | ICD-10-CM | POA: Diagnosis not present

## 2019-12-02 ENCOUNTER — Other Ambulatory Visit: Payer: Self-pay | Admitting: Family Medicine

## 2019-12-02 DIAGNOSIS — N644 Mastodynia: Secondary | ICD-10-CM

## 2020-01-05 ENCOUNTER — Ambulatory Visit: Payer: Medicare HMO

## 2020-01-05 ENCOUNTER — Other Ambulatory Visit: Payer: Self-pay

## 2020-01-05 ENCOUNTER — Ambulatory Visit
Admission: RE | Admit: 2020-01-05 | Discharge: 2020-01-05 | Disposition: A | Discharge status: Home / Self Care | Payer: Medicare HMO | Source: Ambulatory Visit | Attending: Family Medicine | Admitting: Family Medicine

## 2020-01-05 DIAGNOSIS — N644 Mastodynia: Secondary | ICD-10-CM | POA: Diagnosis not present

## 2020-01-24 ENCOUNTER — Ambulatory Visit
Admission: RE | Admit: 2020-01-24 | Discharge: 2020-01-24 | Disposition: A | Discharge status: Home / Self Care | Payer: Medicare HMO | Source: Ambulatory Visit | Attending: Radiation Oncology | Admitting: Radiation Oncology

## 2020-01-24 NOTE — Progress Notes (Incomplete)
Radiation Oncology         [phone] ________________________________  Name: Jamie Macdonald MRN: [id number]  Date: 01/24/2020  DOB: [birth year]  Follow-Up Visit Note  CC: Jamie Ruff, MD  Jamie Ruff, MD  No diagnosis found.  Diagnosis:  FIGO stage IA grade II endometrioid adenocarcinoma  Interval Since Last Radiation: One year, seven months, two weeks, and six days  HDR Radiation treatment dates:  05/07/2018, 05/18/2018, 05/21/2018, 05/28/2018, 06/04/2018  Site/dose: Vagina, prescription to the mucosal surface / 30 Gy in 5 fractions of 6 Gy  Narrative:  The patient returns today for routine follow-up. She was last seen by Dr. Denman George on 11/03/2019, during which time there was noted to be some palpable mesh at the posterior upper vaginal wall, but the patient was asymptomatic. No evidence of recurrence at that time.  Of note, the patient underwent a bilateral diagnostic mammogram on 01/05/2020 for 11-year history of diffuse left breast pain with recent left arm, left shoulder, left neck, and left back pain. There was no evidence of malignancy on examination.  On review of systems, she reports ***. She denies ***.  ALLERGIES:  is allergic to nsaids and estrace [estradiol].  Meds: Current Outpatient Medications  Medication Sig Dispense Refill  . ACCU-CHEK AVIVA PLUS test strip     . Accu-Chek Softclix Lancets lancets     . Blood Glucose Monitoring Suppl (ACCU-CHEK AVIVA PLUS) w/Device KIT     . Flax Oil-Fish Oil-Borage Oil (FISH OIL-FLAX OIL-BORAGE OIL PO) Take 1 capsule by mouth daily.     Marland Kitchen losartan (COZAAR) 50 MG tablet Take by mouth.    . metFORMIN (GLUCOPHAGE) 500 MG tablet Take 500 mg by mouth 2 (two) times daily with a meal.     . Multiple Vitamins-Minerals (GNP DIABETIC SUPPORT FORMULA) TABS Take 2 tablets by mouth.    . Multiple Vitamins-Minerals (MULTIVITAMIN ADULT PO) Take 1 tablet by mouth daily.     . vitamin E (VITAMIN E) 180 MG (400 UNITS) capsule Take  400 Units by mouth daily.     No current facility-administered medications for this encounter.    Physical Findings: The patient is in no acute distress. Patient is alert and oriented.  vitals were not taken for this visit.   Lungs are clear to auscultation bilaterally. Heart has regular rate and rhythm. No palpable cervical, supraclavicular, or axillary adenopathy. Abdomen soft, non-tender, normal bowel sounds. On pelvic examination the external genitalia were unremarkable. A speculum exam was performed. There are no mucosal lesions noted in the vaginal vault. On bimanual examination the vaginal cuff is intact. No pelvic masses appreciated. The right vaginal cuff area has visible suture present and palpable mesh noted submucosally. ***  Lab Findings: Lab Results  Component Value Date   WBC 4.6 04/15/2018   HGB 10.7 (L) 04/15/2018   HCT 32.6 (L) 04/15/2018   MCV 88.8 04/15/2018   PLT 230 04/15/2018    Radiographic Findings: MM DIAG BREAST TOMO BILATERAL  Result Date: 01/05/2020 CLINICAL DATA:  Diffuse left breast pain for the past 11 years. She also reports more recent left arm, left shoulder, left neck and left back pain. EXAM: DIGITAL DIAGNOSTIC BILATERAL MAMMOGRAM WITH TOMO AND CAD COMPARISON:  Previous exam(s). ACR Breast Density Category c: The breast tissue is heterogeneously dense, which may obscure small masses. FINDINGS: Stable mammographic appearance of the breasts with no interval findings suspicious for malignancy in either breast. Mammographic images were processed with CAD. IMPRESSION: No evidence of malignancy. RECOMMENDATION: Bilateral  screening mammogram in 1 year. I have discussed the findings and recommendations with the patient. If applicable, a reminder letter will be sent to the patient regarding the next appointment. BI-RADS CATEGORY  1: Negative. Electronically Signed   By: Claudie Revering M.D.   On: 01/05/2020 10:23    Impression:  FIGO stage IA grade II endometrioid  adenocarcinoma  No evidence of recurrence on clinical exam today.   Plan:  The patient will follow up with Dr. Denman George in three months and with radiation oncology in six months.   Total time spent in this encounter was *** minutes which included reviewing the patient's most recent follow-up with Dr. Denman George, diagnostic mammogram, physical examination, and documentation.  ____________________________________  Blair Promise, PhD, MD  This document serves as a record of services personally performed by Gery Pray, MD. It was created on his behalf by Clerance Lav, a trained medical scribe. The creation of this record is based on the scribe's personal observations and the provider's statements to them. This document has been checked and approved by the attending provider.

## 2020-01-25 ENCOUNTER — Telehealth: Payer: Self-pay | Admitting: *Deleted

## 2020-01-25 NOTE — Telephone Encounter (Signed)
CALLED PATIENT TO ASK ABOUT RESCHEDULING FU VISIT FROM 01-24-20, SPOKE WITH PATIENT AND SHE AGREED TO COME ON 04-17-20 @ 10 AM

## 2020-03-20 ENCOUNTER — Telehealth: Payer: Self-pay | Admitting: *Deleted

## 2020-03-20 NOTE — Telephone Encounter (Signed)
Returned patient's phone call, spoke with patient, patient opted to keep her fu with Dr. Sondra Come on 04-17-20,

## 2020-03-28 ENCOUNTER — Other Ambulatory Visit: Payer: Self-pay

## 2020-03-28 ENCOUNTER — Telehealth: Payer: Self-pay | Admitting: *Deleted

## 2020-03-28 NOTE — Telephone Encounter (Signed)
RETURNED PATIENT'S PHONE CALL, SPOKE WITH PATIENT. ?

## 2020-03-28 NOTE — Telephone Encounter (Signed)
Patient called and stated "I had surgery a year ago with Dr Denman George and had some memory loss. Dr Denman George started me on estradiol cream. When I read the side effects one was vision loss and that scared me; so I stopped the medciation but was also scared to tell Dr Denman George I stopped it. I have stopped some time in the last four months; I don't remember the exact date. But I would like a new script again for the cream." Explained that the message will be given to the nurse/Dr Denman George and the office will call her back later today. Patient's pharmacy confirmed

## 2020-03-30 NOTE — Telephone Encounter (Signed)
Ms Jamie Macdonald states that she is not having any vaginal symptoms. It just occurred to her that she had not been using the estradiol.   Told her that at her visit with Dr. Sondra Come on 07-09-18 she discussed that she had an allergic reaction to the estradiol.   Told her that she does not need to take it per [name], NP.Pt verbalized understanding.

## 2020-04-11 ENCOUNTER — Encounter: Payer: Self-pay | Admitting: Radiation Oncology

## 2020-04-17 ENCOUNTER — Ambulatory Visit
Admission: RE | Admit: 2020-04-17 | Discharge: 2020-04-17 | Disposition: A | Discharge status: Home / Self Care | Payer: Medicare HMO | Source: Ambulatory Visit | Attending: Radiation Oncology | Admitting: Radiation Oncology

## 2020-04-17 ENCOUNTER — Encounter: Payer: Self-pay | Admitting: Radiation Oncology

## 2020-04-17 ENCOUNTER — Other Ambulatory Visit: Payer: Self-pay

## 2020-04-17 VITALS — BP 160/82 | HR 98 | Temp 96.8°F | Resp 18 | Ht 60.0 in | Wt 120.4 lb

## 2020-04-17 DIAGNOSIS — Z8542 Personal history of malignant neoplasm of other parts of uterus: Secondary | ICD-10-CM | POA: Diagnosis not present

## 2020-04-17 DIAGNOSIS — Z79899 Other long term (current) drug therapy: Secondary | ICD-10-CM | POA: Diagnosis not present

## 2020-04-17 DIAGNOSIS — C541 Malignant neoplasm of endometrium: Secondary | ICD-10-CM | POA: Diagnosis not present

## 2020-04-17 DIAGNOSIS — Z923 Personal history of irradiation: Secondary | ICD-10-CM | POA: Diagnosis not present

## 2020-04-17 DIAGNOSIS — Z7984 Long term (current) use of oral hypoglycemic drugs: Secondary | ICD-10-CM | POA: Insufficient documentation

## 2020-04-17 DIAGNOSIS — Z08 Encounter for follow-up examination after completed treatment for malignant neoplasm: Secondary | ICD-10-CM | POA: Diagnosis not present

## 2020-04-17 HISTORY — DX: Personal history of irradiation: Z92.3

## 2020-04-17 NOTE — Progress Notes (Signed)
She is currently in no pain.  Patient complains of Loss of Sleep. Reports urinary frequency Frequency.    Patient states they urinate 1 - 2 times per night.   Patient reports None, 2  bowel movement(s) per day.   Patient reports None.    Patient is using their vaginal dilator.  BP (!) 160/82 (BP Location: Left Arm, Patient Position: Sitting)   Pulse 98   Temp (!) 96.8 F (36 C) (Temporal)   Resp 18   Ht 5' (1.524 m)   Wt 120 lb 6 oz (54.6 kg)   SpO2 100%   BMI 23.51 kg/m

## 2020-04-17 NOTE — Progress Notes (Signed)
Radiation Oncology         [phone] ________________________________  Name: Jamie Macdonald MRN: [id number]  Date: 04/17/2020  DOB: [birth year]  Follow-Up Visit Note  CC: Leighton Ruff, MD  Leighton Ruff, MD    ICD-10-CM   1. Endometrial cancer ([place])  C54.1     Diagnosis:  FIGO stage IA grade II endometrioid adenocarcinoma  Interval Since Last Radiation: One year, ten months, and two weeks  HDR Radiation treatment dates:  05/07/2018, 05/18/2018, 05/21/2018, 05/28/2018, 06/04/2018  Site/dose: Vagina, prescription to the mucosal surface / 30 Gy in 5 fractions of 6 Gy  Narrative:  The patient returns today for routine follow-up. Since her last visit, she was seen by Dr. Denman George on 11/03/2019. On examination, there was noted to be some palpable mesh at the posterior upper vaginal wall, which was asymptomatic for the patient. There was no evidence of recurrence at that time.  The patient did not start vaginal estrogen replacement.  Of note, the patient underwent a bilateral diagnostic mammogram on 01/05/2020 for evaluation of eleven-year history of diffuse left breast pain with new onset left arm, shoulder, neck, and back pain. Results were negative for malignancy.  On review of systems, she reports using her vaginal dilator 3 times a week.  No bleeding after use of the dilator. she reports no pain with using the vaginal dilator. She denies abdominal bloating or pelvic pain.Marland Kitchen  ALLERGIES:  is allergic to nsaids and estrace [estradiol].  Meds: Current Outpatient Medications  Medication Sig Dispense Refill  . ACCU-CHEK AVIVA PLUS test strip     . Accu-Chek Softclix Lancets lancets     . Blood Glucose Monitoring Suppl (ACCU-CHEK AVIVA PLUS) w/Device KIT     . losartan (COZAAR) 50 MG tablet Take by mouth.    . metFORMIN (GLUCOPHAGE) 500 MG tablet Take 500 mg by mouth 2 (two) times daily with a meal.     . Multiple Vitamins-Minerals (GNP DIABETIC SUPPORT FORMULA) TABS Take 2  tablets by mouth.    . Multiple Vitamins-Minerals (MULTIVITAMIN ADULT PO) Take 1 tablet by mouth daily.     . vitamin E 180 MG (400 UNITS) capsule Take 400 Units by mouth daily.    . Flax Oil-Fish Oil-Borage Oil (FISH OIL-FLAX OIL-BORAGE OIL PO) Take 1 capsule by mouth daily.  (Patient not taking: Reported on 04/17/2020)     No current facility-administered medications for this encounter.    Physical Findings: The patient is in no acute distress. Patient is alert and oriented.  height is 5' (1.524 m) and weight is 120 lb 6 oz (54.6 kg). Her temporal temperature is 96.8 F (36 C) (abnormal). Her blood pressure is 160/82 (abnormal) and her pulse is 98. Her respiration is 18 and oxygen saturation is 100%.   Lungs are clear to auscultation bilaterally. Heart has regular rate and rhythm. No palpable cervical, supraclavicular, or axillary adenopathy. Abdomen soft, non-tender, normal bowel sounds. On pelvic examination the external genitalia were unremarkable. A speculum exam was performed. There are no mucosal lesions noted in the vaginal vault. On bimanual examination the vaginal cuff is intact.  Palpable and visible mess located along the upper posterior right vaginal wall and cuff region.  No obvious palpable masses within the pelvic region.  Lab Findings: Lab Results  Component Value Date   WBC 4.6 04/15/2018   HGB 10.7 (L) 04/15/2018   HCT 32.6 (L) 04/15/2018   MCV 88.8 04/15/2018   PLT 230 04/15/2018    Radiographic Findings: No  results found.  Impression:  FIGO stage IA grade II endometrioid adenocarcinoma  No evidence of recurrence on clinical exam today.   Plan:  The patient will follow up with Dr. Denman George in 6 months and with radiation oncology in 1 year now that she is 2 years out from her radiation therapy without recurrence.  Total time spent in this encounter was 20 minutes which included reviewing the patient's most recent follow-up with Dr. Denman George, diagnostic mammogram,  physical examination, and documentation.  ____________________________________  Blair Promise, PhD, MD  This document serves as a record of services personally performed by Gery Pray, MD. It was created on his behalf by Clerance Lav, a trained medical scribe. The creation of this record is based on the scribe's personal observations and the provider's statements to them. This document has been checked and approved by the attending provider.

## 2020-04-19 DIAGNOSIS — Z6824 Body mass index (BMI) 24.0-24.9, adult: Secondary | ICD-10-CM | POA: Diagnosis not present

## 2020-04-19 DIAGNOSIS — E1122 Type 2 diabetes mellitus with diabetic chronic kidney disease: Secondary | ICD-10-CM | POA: Diagnosis not present

## 2020-04-19 DIAGNOSIS — Z0000 Encounter for general adult medical examination without abnormal findings: Secondary | ICD-10-CM | POA: Diagnosis not present

## 2020-04-19 DIAGNOSIS — N183 Chronic kidney disease, stage 3 unspecified: Secondary | ICD-10-CM | POA: Diagnosis not present

## 2020-04-19 DIAGNOSIS — I129 Hypertensive chronic kidney disease with stage 1 through stage 4 chronic kidney disease, or unspecified chronic kidney disease: Secondary | ICD-10-CM | POA: Diagnosis not present

## 2020-04-19 DIAGNOSIS — Z1389 Encounter for screening for other disorder: Secondary | ICD-10-CM | POA: Diagnosis not present

## 2020-04-19 DIAGNOSIS — Z1211 Encounter for screening for malignant neoplasm of colon: Secondary | ICD-10-CM | POA: Diagnosis not present

## 2020-05-02 ENCOUNTER — Telehealth: Payer: Self-pay | Admitting: *Deleted

## 2020-05-02 NOTE — Telephone Encounter (Signed)
Returned call and explained that the schedule isn't out for July yet; explained to call back the end of May or beginning of June.

## 2020-05-22 DIAGNOSIS — N1832 Chronic kidney disease, stage 3b: Secondary | ICD-10-CM | POA: Diagnosis not present

## 2020-05-29 DIAGNOSIS — I129 Hypertensive chronic kidney disease with stage 1 through stage 4 chronic kidney disease, or unspecified chronic kidney disease: Secondary | ICD-10-CM | POA: Diagnosis not present

## 2020-05-29 DIAGNOSIS — E1122 Type 2 diabetes mellitus with diabetic chronic kidney disease: Secondary | ICD-10-CM | POA: Diagnosis not present

## 2020-05-29 DIAGNOSIS — N1832 Chronic kidney disease, stage 3b: Secondary | ICD-10-CM | POA: Diagnosis not present

## 2020-05-29 DIAGNOSIS — N183 Chronic kidney disease, stage 3 unspecified: Secondary | ICD-10-CM | POA: Diagnosis not present

## 2020-06-06 DIAGNOSIS — J029 Acute pharyngitis, unspecified: Secondary | ICD-10-CM | POA: Diagnosis not present

## 2020-07-25 DIAGNOSIS — N8110 Cystocele, unspecified: Secondary | ICD-10-CM | POA: Diagnosis not present

## 2020-08-14 DIAGNOSIS — J029 Acute pharyngitis, unspecified: Secondary | ICD-10-CM | POA: Diagnosis not present

## 2020-08-14 DIAGNOSIS — E1122 Type 2 diabetes mellitus with diabetic chronic kidney disease: Secondary | ICD-10-CM | POA: Diagnosis not present

## 2020-10-04 ENCOUNTER — Telehealth: Payer: Self-pay | Admitting: *Deleted

## 2020-10-04 DIAGNOSIS — Z135 Encounter for screening for eye and ear disorders: Secondary | ICD-10-CM | POA: Diagnosis not present

## 2020-10-04 DIAGNOSIS — D3132 Benign neoplasm of left choroid: Secondary | ICD-10-CM | POA: Diagnosis not present

## 2020-10-04 DIAGNOSIS — H5213 Myopia, bilateral: Secondary | ICD-10-CM | POA: Diagnosis not present

## 2020-10-04 DIAGNOSIS — H2513 Age-related nuclear cataract, bilateral: Secondary | ICD-10-CM | POA: Diagnosis not present

## 2020-10-04 DIAGNOSIS — E119 Type 2 diabetes mellitus without complications: Secondary | ICD-10-CM | POA: Diagnosis not present

## 2020-10-04 NOTE — Telephone Encounter (Signed)
Called and left the patient a message to call the office back. Need to move appt and change provider

## 2020-10-05 NOTE — Telephone Encounter (Signed)
Returned the patient's call and rescheduled her appt from 10/4 to 10/3 and changed providers

## 2020-10-13 ENCOUNTER — Other Ambulatory Visit: Payer: Self-pay | Admitting: Adult Health

## 2020-10-13 DIAGNOSIS — C541 Malignant neoplasm of endometrium: Secondary | ICD-10-CM

## 2020-10-16 ENCOUNTER — Inpatient Hospital Stay: Payer: Medicare HMO | Attending: Gynecologic Oncology | Admitting: Gynecologic Oncology

## 2020-10-16 ENCOUNTER — Encounter: Payer: Self-pay | Admitting: Gynecologic Oncology

## 2020-10-16 ENCOUNTER — Other Ambulatory Visit: Payer: Self-pay

## 2020-10-16 VITALS — BP 160/69 | HR 88 | Temp 98.9°F | Resp 16 | Wt 125.8 lb

## 2020-10-16 DIAGNOSIS — Z90722 Acquired absence of ovaries, bilateral: Secondary | ICD-10-CM | POA: Insufficient documentation

## 2020-10-16 DIAGNOSIS — Z923 Personal history of irradiation: Secondary | ICD-10-CM | POA: Diagnosis not present

## 2020-10-16 DIAGNOSIS — Z8542 Personal history of malignant neoplasm of other parts of uterus: Secondary | ICD-10-CM | POA: Insufficient documentation

## 2020-10-16 DIAGNOSIS — Z90710 Acquired absence of both cervix and uterus: Secondary | ICD-10-CM | POA: Insufficient documentation

## 2020-10-16 DIAGNOSIS — C541 Malignant neoplasm of endometrium: Secondary | ICD-10-CM

## 2020-10-16 NOTE — Patient Instructions (Addendum)
It was nice to meet you today!  I do not see or feel any evidence concerning for cancer recurrence on your exam.  You will see Dr. Sondra Come in 6 months and see me back in a year.  Please call the Office at [phone]  sometime next August of 2023 or September to get scheduled to see me in October 2023.  If anything changes between visits and you have new or concerning symptoms (such as vaginal bleeding, pelvic pain, change to bowel function), please call to see me sooner.

## 2020-10-16 NOTE — Progress Notes (Signed)
Gynecologic Oncology Return Clinic Visit  10/16/2020  Reason for Visit: Surveillance visit in the setting of high/intermediate risk uterine cancer  Treatment History: Oncology History  Endometrial cancer ([place])  03/19/2018 Initial Diagnosis   Endometrial cancer ([place])   03/19/2018 Surgery   Operation: Robotic-assisted laparoscopic total hysterectomy with bilateral salpingoophorectomy, SLN biopsy.   05/07/2018 - 06/04/2018 Radiation Therapy   HDR Radiation treatment dates:   05/07/2018, 05/18/2018, 05/21/2018, 05/28/2018, 06/04/2018    The patient reports a history of occasional bleeding symptoms since the beginning of November 2019.  She was on estradiol while using her pessary which had been placed in 2019 for severe pelvic organ prolapse.  She was evaluated by Dr. Nelda Marseille who performed a D&C on January 30, 2018.  This revealed well differentiated endometrial adenocarcinoma with foci approaching moderate differentiation.   The patient has a history of severe pelvic organ prolapse that is symptomatic.  She saw Dr. [place] Pink in May 2018 who prescribed vaginal estrogen.  She then was seen back later in the year and recommended hysterectomy with a tacking procedure.  The patient was concerned about the use of vaginal mesh and therefore declined at this time.  She subsequently saw Dr. Baruch Goldmann who placed a vaginal pessary which does help with her symptoms of prolapse however the patient does not like having to wear a pessary and desires definitive surgical management.   On 03/19/18 she underwent a robotic assisted total hysterectomy BSO, SLN biopsy and abdominal sacrocolpopexy. Postoperatively she had a prolonged hospitalization due to self-induced hyponatremia due to water intoxication.    Final pathology revealed stage IA grade 2 endometrioid endometrial cancer with 1 mm depth of invasion total thickness greater than 30 mm.  This represented less than one half.  No lymphovascular space invasion.   Negative sentinel lymph nodes, cervix, right adnexa.  Tumor measured 1.4 cm.  It was a FIGO grade 2.  Due to her age of 37 and a FIGO grade 2 cancer, she meets high intermediate risk factor criteria. She was determined to have high intermediate risk features and therefore vaginal brachytherapy was recommended in accordance with NCCN guidelines to minimize local recurrence.   Interval History: Patient reports doing well since her last visit.  She continues to use her vaginal dilator 3 times a week.  She denies any abdominal or pelvic pain.  She denies any vaginal bleeding or discharge.  She reports normal bowel and bladder function.  Past Medical/Surgical History: Past Medical History:  Diagnosis Date   Anemia    Cancer ([place])    ENDOMETRIAL CANCER   Chronic kidney disease    ckd STAGE 3LOV DR [facility] 12-12-17   Diabetes mellitus    Diabetes mellitus type 2 in nonobese ([place]) 01/16/2018   Essential hypertension 01/16/2018   Exposure to hepatitis C    AS CHILD   Exposure to TB 1973   AS CHILD   Fatty tumor    LEFT SHOULDER REMOVED   Fibroids    History of radiation therapy 05/07/2018-06/04/2018   vaginal brachytherapy   Dr Sondra Come   Hypertension    SLIGHT   Uterine prolapse 01/16/2018   UTI (urinary tract infection)    STARTED AMOXICILLIAN FRIDAY 03-13-2018    Past Surgical History:  Procedure Laterality Date   CERVICAL CONING  74 OR 75   DILATATION & CURETTAGE/HYSTEROSCOPY WITH MYOSURE N/A 01/30/2018   Procedure: DILATATION & CURETTAGE/HYSTEROSCOPY WITH MYOSURE;  Surgeon: Janyth Pupa, DO;  Location: [facility];  Service: Gynecology;  Laterality: N/A;   ROBOTIC ASSISTED LAPAROSCOPIC SACROCOLPOPEXY N/A 03/19/2018   Procedure: XI ROBOTIC ASSISTED LAPAROSCOPIC SACROCOLPOPEXY;  Surgeon: Ardis Hughs, MD;  Location: [facility];  Service: Urology;  Laterality: N/A;   ROBOTIC ASSISTED TOTAL HYSTERECTOMY WITH BILATERAL SALPINGO OOPHERECTOMY Bilateral 03/19/2018    Procedure: XI ROBOTIC ASSISTED TOTAL HYSTERECTOMY WITH BILATERAL SALPINGO OOPHORECTOMY;  Surgeon: Everitt Amber, MD;  Location: [facility];  Service: Gynecology;  Laterality: Bilateral;   SENTINEL NODE BIOPSY Bilateral 03/19/2018   Procedure: SENTINEL NODE BIOPSY;  Surgeon: Everitt Amber, MD;  Location: [facility];  Service: Gynecology;  Laterality: Bilateral;    Family History  Problem Relation Age of Onset   Stroke Mother    Parkinson's disease Mother    Heart disease Father        HEART ATTACK   Diabetes Father    Prostate cancer Father    Diabetes Sister    Cancer Sister     Social History   Socioeconomic History   Marital status: Divorced    Spouse name: Not on file   Number of children: Not on file   Years of education: Not on file   Highest education level: Not on file  Occupational History   Not on file  Tobacco Use   Smoking status: Never   Smokeless tobacco: Never  Vaping Use   Vaping Use: Never used  Substance and Sexual Activity   Alcohol use: No   Drug use: Never   Sexual activity: Not on file  Other Topics Concern   Not on file  Social History Narrative   Not on file   Social Determinants of Health   Financial Resource Strain: Not on file  Food Insecurity: Not on file  Transportation Needs: Not on file  Physical Activity: Not on file  Stress: Not on file  Social Connections: Not on file    Current Medications:  Current Outpatient Medications:    ACCU-CHEK AVIVA PLUS test strip, , Disp: , Rfl:    Accu-Chek Softclix Lancets lancets, , Disp: , Rfl:    Blood Glucose Monitoring Suppl (ACCU-CHEK AVIVA PLUS) w/Device KIT, , Disp: , Rfl:    Flax Oil-Fish Oil-Borage Oil (FISH OIL-FLAX OIL-BORAGE OIL PO), Take 1 capsule by mouth daily.  (Patient not taking: Reported on 04/17/2020), Disp: , Rfl:    losartan (COZAAR) 50 MG tablet, Take by mouth., Disp: , Rfl:    metFORMIN (GLUCOPHAGE) 500 MG tablet, Take 500 mg by mouth 2 (two) times daily with a meal. , Disp: , Rfl:     Multiple Vitamins-Minerals (GNP DIABETIC SUPPORT FORMULA) TABS, Take 2 tablets by mouth., Disp: , Rfl:    Multiple Vitamins-Minerals (MULTIVITAMIN ADULT PO), Take 1 tablet by mouth daily. , Disp: , Rfl:    PFIZER-BIONT COVID-19 VAC-TRIS SUSP injection, , Disp: , Rfl:    vitamin E 180 MG (400 UNITS) capsule, Take 400 Units by mouth daily., Disp: , Rfl:   Review of Systems: Denies appetite changes, fevers, chills, fatigue, unexplained weight changes. Denies hearing loss, neck lumps or masses, mouth sores, ringing in ears or voice changes. Denies cough or wheezing.  Denies shortness of breath. Denies chest pain or palpitations. Denies leg swelling. Denies abdominal distention, pain, blood in stools, constipation, diarrhea, nausea, vomiting, or early satiety. Denies pain with intercourse, dysuria, frequency, hematuria or incontinence. Denies hot flashes, pelvic pain, vaginal bleeding or vaginal discharge.   Denies joint pain, back pain or muscle pain/cramps. Denies itching, rash, or wounds. Denies dizziness, headaches, numbness or seizures.  Denies swollen lymph nodes or glands, denies easy bruising or bleeding. Denies anxiety, depression, confusion, or decreased concentration.  Physical Exam: BP (!) 160/69 (BP Location: Left Arm, Patient Position: Sitting)   Pulse 88   Temp 98.9 F (37.2 C) (Oral)   Resp 16   Wt 125 lb 12.8 oz (57.1 kg)   SpO2 100%   BMI 24.57 kg/m  General: Alert, oriented, no acute distress. HEENT: Normocephalic, atraumatic, sclera anicteric. Chest: Clear to auscultation bilaterally.  No wheezing or rhonchi. Cardiovascular: Regular rate and rhythm, no murmurs. Abdomen: soft, nontender.  Normoactive bowel sounds.  No masses or hepatosplenomegaly appreciated.  Well-healed incisions. Extremities: Grossly normal range of motion.  Warm, well perfused.  No edema bilaterally. Skin: No rashes or lesions noted. Lymphatics: No cervical, supraclavicular, or inguinal  adenopathy. GU: Normal appearing external genitalia without erythema, excoriation, or lesions.  Speculum exam reveals moderately atrophic vaginal mucosa.  Suture versus mesh is visible at the right vaginal apex, no lesions or masses noted.  Bimanual exam reveals mesh palpable at the right apex, no nodularity or masses.  Rectovaginal exam confirms these findings.  Laboratory & Radiologic Studies: None new  Assessment & Plan: Jamie Macdonald is a 75 y.o. woman with Stage IA grade 2 endometrioid endometrial adenocarcinoma who presents for surveillance visit now more than 2 years out from completion of adjuvant therapy.  Patient is overall doing well today and is NED.  Now that she is more than 2 years out, discussed transitioning to visits every 6 months.  She is scheduled to see Dr. Sondra Come in April and will come back and see me next October.  We reviewed signs and symptoms that should prompt a phone call to see me sooner.  Continues to have mesh noted at the right vaginal apex, asymptomatic.  32 minutes of total time was spent for this patient encounter, including preparation, face-to-face counseling with the patient and coordination of care, and documentation of the encounter.  Jeral Pinch, MD  Division of Gynecologic Oncology  Department of Obstetrics and Gynecology  [facility]

## 2020-10-17 ENCOUNTER — Telehealth: Payer: Self-pay | Admitting: *Deleted

## 2020-10-17 ENCOUNTER — Ambulatory Visit: Payer: Medicare HMO | Admitting: Gynecologic Oncology

## 2020-10-17 ENCOUNTER — Encounter: Payer: Self-pay | Admitting: *Deleted

## 2020-10-17 NOTE — Progress Notes (Signed)
Oncology Nurse Navigator Documentation  Oncology Nurse Navigator Flowsheets 10/16/2020        Navigator Location [facility]        Referral Date to RadOnc/MedOnc 05/07/2018        Navigator Encounter Type Survivorship        Telephone -        Patient Visit Type Follow-up        Treatment Phase Survivorship        Barriers/Navigation Needs Anxiety        Education -        Interventions Education        Acuity Level 2-Minimal Needs (1-2 Barriers Identified)        Referrals -        Coordination of Care -        Education Method Verbal;Written;Teach-back        Support Groups/Services Friends and Family        Time Spent with Patient 15            Pt stated that she received the Omicron Bivalent Covid vaccine on 10/13/2020 through her pharmacy.

## 2020-10-17 NOTE — Telephone Encounter (Signed)
Returned the patient's call and the patient's requested to have metformin and fish oil to be taken off her med list

## 2020-11-03 ENCOUNTER — Telehealth: Payer: Self-pay | Admitting: *Deleted

## 2020-11-03 NOTE — Telephone Encounter (Signed)
Patient called and stated "I enjoyed meeting you and understood all that you said. I look forward to you being my doctor."

## 2020-11-11 DIAGNOSIS — Z23 Encounter for immunization: Secondary | ICD-10-CM | POA: Diagnosis not present

## 2020-11-23 DIAGNOSIS — N1832 Chronic kidney disease, stage 3b: Secondary | ICD-10-CM | POA: Diagnosis not present

## 2020-11-30 DIAGNOSIS — I129 Hypertensive chronic kidney disease with stage 1 through stage 4 chronic kidney disease, or unspecified chronic kidney disease: Secondary | ICD-10-CM | POA: Diagnosis not present

## 2020-11-30 DIAGNOSIS — N1832 Chronic kidney disease, stage 3b: Secondary | ICD-10-CM | POA: Diagnosis not present

## 2020-11-30 DIAGNOSIS — E1122 Type 2 diabetes mellitus with diabetic chronic kidney disease: Secondary | ICD-10-CM | POA: Diagnosis not present

## 2020-11-30 DIAGNOSIS — N183 Chronic kidney disease, stage 3 unspecified: Secondary | ICD-10-CM | POA: Diagnosis not present

## 2020-12-01 DIAGNOSIS — Z1231 Encounter for screening mammogram for malignant neoplasm of breast: Secondary | ICD-10-CM | POA: Diagnosis not present

## 2020-12-01 DIAGNOSIS — I129 Hypertensive chronic kidney disease with stage 1 through stage 4 chronic kidney disease, or unspecified chronic kidney disease: Secondary | ICD-10-CM | POA: Diagnosis not present

## 2020-12-01 DIAGNOSIS — N183 Chronic kidney disease, stage 3 unspecified: Secondary | ICD-10-CM | POA: Diagnosis not present

## 2020-12-04 ENCOUNTER — Other Ambulatory Visit: Payer: Self-pay | Admitting: Family Medicine

## 2020-12-04 DIAGNOSIS — Z1231 Encounter for screening mammogram for malignant neoplasm of breast: Secondary | ICD-10-CM

## 2021-01-31 ENCOUNTER — Ambulatory Visit: Payer: Medicare HMO

## 2021-02-08 DIAGNOSIS — E1122 Type 2 diabetes mellitus with diabetic chronic kidney disease: Secondary | ICD-10-CM | POA: Diagnosis not present

## 2021-02-09 DIAGNOSIS — Z1231 Encounter for screening mammogram for malignant neoplasm of breast: Secondary | ICD-10-CM | POA: Diagnosis not present

## 2021-04-22 NOTE — Progress Notes (Signed)
?Radiation Oncology         [phone] ?________________________________ ? ?Name: Jamie Macdonald MRN: [id number]  ?Date: 04/23/2021  DOB: [birth year] ? ?Follow-Up Visit Note ? ?CC: Leighton Ruff, MD (Inactive)  Leighton Ruff, MD ? ?  ICD-10-CM   ?1. Endometrial cancer ([place])  C54.1   ?  ? ? ?Diagnosis:   FIGO stage IA grade II endometrioid adenocarcinoma ? ?Interval Since Last Radiation:  2 years, 10 months, and 20 days   ? ?HDR Radiation treatment dates:  05/07/2018, 05/18/2018, 05/21/2018, 05/28/2018, 06/04/2018 ?  ?Site/dose: Vagina, prescription to the mucosal surface / 30 Gy in 5 fractions of 6 Gy ? ?Narrative:  The patient returns today for routine annual follow-up, she was last seen here for follow up on 04/17/20. Since her last visit,  the patient followed up with Dr. Berline Lopes on 10/16/20. During which time, the patient reported using  her vaginal dilator 3 times a week, denied any symptoms concerning for disease recurrence, and was NED on examination.                ? ?Imaging performed in the interval since she was last seen includes a bilateral screening mammogram on 02/09/21 which showed no evidence of malignancy in either breast.  ? ?She reports no new medical issues.  She continues to use her vaginal dilator 3 times a week.  I have discussed that since she is 3 years out from her treatment she could discontinue this procedure. ? ?She denies any abdominal bloating or vaginal bleeding.  She denies any pelvic pain hematuria or rectal bleeding.              ? ?Allergies:  is allergic to nsaids, aspirin, estradiol, and statins. ? ?Meds: ?Current Outpatient Medications  ?Medication Sig Dispense Refill  ? ACCU-CHEK AVIVA PLUS test strip     ? Accu-Chek Softclix Lancets lancets     ? acetaminophen (TYLENOL) 325 MG tablet 1 tablet as needed    ? Blood Glucose Monitoring Suppl (ACCU-CHEK AVIVA PLUS) w/Device KIT     ? losartan (COZAAR) 50 MG tablet Take by mouth.    ? Multiple Vitamins-Minerals (GNP  DIABETIC SUPPORT FORMULA) TABS Take 2 tablets by mouth.    ? PFIZER-BIONT COVID-19 VAC-TRIS SUSP injection     ? UNABLE TO FIND Place 1 suppository vaginally 2 (two) times daily. Med Name: Vitamin E Vaginal Suppositories from [place]    ? vitamin E 180 MG (400 UNITS) capsule Take 400 Units by mouth daily.    ? ?No current facility-administered medications for this encounter.  ? ? ?Physical Findings: ?The patient is in no acute distress. Patient is alert and oriented. ? height is 5' (1.524 m) and weight is 121 lb 2 oz (54.9 kg). Her temporal temperature is 98.1 ?F (36.7 ?C). Her blood pressure is 161/81 (abnormal) and her pulse is 83. Her respiration is 18 and oxygen saturation is 100%. .  No significant changes. Lungs are clear to auscultation bilaterally. Heart has regular rate and rhythm. No palpable cervical, supraclavicular, or axillary adenopathy. Abdomen soft, non-tender, normal bowel sounds. ? ?On pelvic examination the external genitalia were unremarkable. A speculum exam was performed. There are no mucosal lesions noted in the vaginal vault.  Blue sutures/mesh material is noticed in the right upper vaginal region.  This has been noticed on previous exams.  On bimanual and rectovaginal examination there are no pelvic masses appreciated. ? ? ?Lab Findings: ?Lab Results  ?Component Value Date  ?  WBC 4.6 04/15/2018  ? HGB 10.7 (L) 04/15/2018  ? HCT 32.6 (L) 04/15/2018  ? MCV 88.8 04/15/2018  ? PLT 230 04/15/2018  ? ? ?Radiographic Findings: ?No results found. ? ?Impression:  FIGO stage IA grade II endometrioid adenocarcinoma ? ?No evidence of recurrence on clinical exam today.  She does not appear to be exhibiting any long-term effects from her surgery and vaginal brachytherapy. ? ?Plan: She will follow-up with Dr. Berline Lopes in 6 months.  Routine follow-up in radiation oncology in 1 year. ? ? ?25 minutes of total time was spent for this patient encounter, including preparation, face-to-face counseling  with the patient and coordination of care, physical exam, and documentation of the encounter. ?____________________________________ ? ?Blair Promise, PhD, MD ? ?This document serves as a record of services personally performed by Gery Pray, MD. It was created on his behalf by Roney Mans, a trained medical scribe. The creation of this record is based on the scribe's personal observations and the provider's statements to them. This document has been checked and approved by the attending provider. ? ?

## 2021-04-23 ENCOUNTER — Other Ambulatory Visit: Payer: Self-pay

## 2021-04-23 ENCOUNTER — Encounter: Payer: Self-pay | Admitting: Radiation Oncology

## 2021-04-23 ENCOUNTER — Ambulatory Visit
Admission: RE | Admit: 2021-04-23 | Discharge: 2021-04-23 | Disposition: A | Discharge status: Home / Self Care | Payer: Medicare HMO | Source: Ambulatory Visit | Attending: Radiation Oncology | Admitting: Radiation Oncology

## 2021-04-23 VITALS — BP 161/81 | HR 83 | Temp 98.1°F | Resp 18 | Ht 60.0 in | Wt 121.1 lb

## 2021-04-23 DIAGNOSIS — C541 Malignant neoplasm of endometrium: Secondary | ICD-10-CM | POA: Diagnosis not present

## 2021-04-23 DIAGNOSIS — Z923 Personal history of irradiation: Secondary | ICD-10-CM | POA: Diagnosis not present

## 2021-04-23 DIAGNOSIS — Z8542 Personal history of malignant neoplasm of other parts of uterus: Secondary | ICD-10-CM | POA: Diagnosis not present

## 2021-04-23 NOTE — Progress Notes (Signed)
Jamie Macdonald is here today for follow up post radiation to the pelvic. ? ?They completed their radiation on: 06/04/2018  ? ?Does the patient complain of any of the following: ? ?Pain:denies ?Abdominal bloating: denies ?Diarrhea/Constipation: denies ?Nausea/Vomiting: denies ?Vaginal Discharge: denies ?Blood in Urine or Stool: denies ?Urinary Issues (dysuria/incomplete emptying/ incontinence/ increased frequency/urgency): frequency, occasional urgency, nocturia x1-2 ?Does patient report using vaginal dilator 2-3 times a week and/or sexually active 2-3 weeks: yes ?Post radiation skin changes: denies ? ? ?Additional comments if applicable: nothing of note ? ?Vitals:  ? 04/23/21 0948  ?BP: (!) 161/81  ?Pulse: 83  ?Resp: 18  ?Temp: 98.1 ?F (36.7 ?C)  ?TempSrc: Temporal  ?SpO2: 100%  ?Weight: 121 lb 2 oz (54.9 kg)  ?Height: 5' (1.524 m)  ? ? ?

## 2021-04-30 DIAGNOSIS — N183 Chronic kidney disease, stage 3 unspecified: Secondary | ICD-10-CM | POA: Diagnosis not present

## 2021-04-30 DIAGNOSIS — I129 Hypertensive chronic kidney disease with stage 1 through stage 4 chronic kidney disease, or unspecified chronic kidney disease: Secondary | ICD-10-CM | POA: Diagnosis not present

## 2021-04-30 DIAGNOSIS — Z8542 Personal history of malignant neoplasm of other parts of uterus: Secondary | ICD-10-CM | POA: Diagnosis not present

## 2021-05-21 DIAGNOSIS — N1832 Chronic kidney disease, stage 3b: Secondary | ICD-10-CM | POA: Diagnosis not present

## 2021-05-30 DIAGNOSIS — N1832 Chronic kidney disease, stage 3b: Secondary | ICD-10-CM | POA: Diagnosis not present

## 2021-05-30 DIAGNOSIS — E1122 Type 2 diabetes mellitus with diabetic chronic kidney disease: Secondary | ICD-10-CM | POA: Diagnosis not present

## 2021-05-30 DIAGNOSIS — N183 Chronic kidney disease, stage 3 unspecified: Secondary | ICD-10-CM | POA: Diagnosis not present

## 2021-05-30 DIAGNOSIS — I129 Hypertensive chronic kidney disease with stage 1 through stage 4 chronic kidney disease, or unspecified chronic kidney disease: Secondary | ICD-10-CM | POA: Diagnosis not present

## 2021-08-22 ENCOUNTER — Telehealth: Payer: Self-pay | Admitting: *Deleted

## 2021-08-22 NOTE — Telephone Encounter (Signed)
Patient called and scheduled a follow up appt on October

## 2021-10-08 DIAGNOSIS — H5212 Myopia, left eye: Secondary | ICD-10-CM | POA: Diagnosis not present

## 2021-10-23 ENCOUNTER — Encounter: Payer: Self-pay | Admitting: Obstetrics & Gynecology

## 2021-10-24 ENCOUNTER — Inpatient Hospital Stay: Payer: Medicare HMO | Attending: Obstetrics & Gynecology | Admitting: Obstetrics & Gynecology

## 2021-10-24 ENCOUNTER — Other Ambulatory Visit: Payer: Self-pay

## 2021-10-24 ENCOUNTER — Encounter: Payer: Self-pay | Admitting: Obstetrics & Gynecology

## 2021-10-24 VITALS — BP 163/69 | HR 81 | Temp 98.6°F | Resp 16 | Ht 58.66 in | Wt 113.8 lb

## 2021-10-24 DIAGNOSIS — Z8542 Personal history of malignant neoplasm of other parts of uterus: Secondary | ICD-10-CM | POA: Insufficient documentation

## 2021-10-24 DIAGNOSIS — C541 Malignant neoplasm of endometrium: Secondary | ICD-10-CM

## 2021-10-24 NOTE — Assessment & Plan Note (Addendum)
Jamie Macdonald is a 76 y.o. woman with Stage IA grade 2 endometrioid endometrial adenocarcinoma who presents for surveillance Negative symptom review, normal exam.  No evidence of recurrence  Continue visits every 6 months.  She will see Dr. Sondra Come in April and will return in 1 yr

## 2021-10-24 NOTE — Progress Notes (Signed)
Follow Up Note: Gyn-Onc  Jamie Macdonald 76 y.o. female  CC: She presents for a f/u visit   HPI: The oncology history was reviewed.  Interval History: She denies any vaginal bleeding, abdominal/pelvic pain, cough, lethargy or increasing abdominal girth. She was seen in f/u by Dr. Sondra Come in 4/23.  She was felt to be clinically NED.    Review of Systems  Review of Systems  Constitutional:  Negative for malaise/fatigue and weight loss.  Respiratory:  Negative for shortness of breath and wheezing.   Cardiovascular:  Negative for chest pain and leg swelling.  Gastrointestinal:  Negative for abdominal pain, blood in stool, constipation, nausea and vomiting.  Genitourinary:  Negative for dysuria, frequency, hematuria and urgency.  Musculoskeletal:  Negative for joint pain and myalgias.  Neurological:  Negative for weakness.  Psychiatric/Behavioral:  Negative for depression. The patient does not have insomnia.    Current medications, allergy, social history, past surgical history, past medical history, family history were all reviewed.    Vitals:  BP (!) 163/69 (BP Location: Left Arm, Patient Position: Sitting)   Pulse 81   Temp 98.6 F (37 C) (Oral)   Resp 16   Ht 4' 10.66" (1.49 m)   Wt 113 lb 12.8 oz (51.6 kg)   SpO2 100%   BMI 23.25 kg/m    Physical Exam:  Physical Exam Exam conducted with a chaperone present.  Constitutional:      General: She is not in acute distress. Cardiovascular:     Rate and Rhythm: Normal rate and regular rhythm.  Pulmonary:     Effort: Pulmonary effort is normal.     Breath sounds: Normal breath sounds. No wheezing or rhonchi.  Abdominal:     Palpations: Abdomen is soft.     Tenderness: There is no abdominal tenderness. There is no right CVA tenderness or left CVA tenderness.     Hernia: No hernia is present.  Genitourinary:    General: Normal vulva.     Urethra: No urethral lesion.     Vagina: Mesh palpable at the right apex, no nodularity or  masses Musculoskeletal:     Cervical back: Neck supple.     Right lower leg: No edema.     Left lower leg: No edema.  Lymphadenopathy:     Upper Body:     Right upper body: No supraclavicular adenopathy.     Left upper body: No supraclavicular adenopathy.     Lower Body: No right inguinal adenopathy. No left inguinal adenopathy.  Skin:    Findings: No rash.  Neurological:     Mental Status: She is oriented to person, place, and time.   Assessment/Plan:  Endometrial cancer ([place]) Jamie Macdonald is a 76 y.o. woman with Stage IA grade 2 endometrioid endometrial adenocarcinoma who presents for surveillance Negative symptom review, normal exam.  No evidence of recurrence  Continue visits every 6 months.  She will see Dr. Sondra Come in April and will return in 1 yr   I personally spent 25 minutes face-to-face and non-face-to-face in the care of this patient, which includes all pre, intra, and post visit time on the date of service.    Jamie Crocker, MD

## 2021-10-24 NOTE — Patient Instructions (Signed)
Return in 1 yr 

## 2021-10-29 ENCOUNTER — Telehealth: Payer: Self-pay | Admitting: *Deleted

## 2021-10-29 NOTE — Telephone Encounter (Signed)
Patient called and stated "I just saw Dr Delsa Sale and got a good bill of health. Should I go ahead and get the flu vaccine? I heard and read the side effects, should I wait or get a loser dose?"  Explained that the message would be given to [facility] APP and the office will call her back.

## 2021-10-30 DIAGNOSIS — Z23 Encounter for immunization: Secondary | ICD-10-CM | POA: Diagnosis not present

## 2021-12-03 DIAGNOSIS — N1832 Chronic kidney disease, stage 3b: Secondary | ICD-10-CM | POA: Diagnosis not present

## 2021-12-10 DIAGNOSIS — I129 Hypertensive chronic kidney disease with stage 1 through stage 4 chronic kidney disease, or unspecified chronic kidney disease: Secondary | ICD-10-CM | POA: Diagnosis not present

## 2021-12-10 DIAGNOSIS — E1122 Type 2 diabetes mellitus with diabetic chronic kidney disease: Secondary | ICD-10-CM | POA: Diagnosis not present

## 2021-12-10 DIAGNOSIS — N1832 Chronic kidney disease, stage 3b: Secondary | ICD-10-CM | POA: Diagnosis not present

## 2022-01-23 DIAGNOSIS — Z6823 Body mass index (BMI) 23.0-23.9, adult: Secondary | ICD-10-CM | POA: Diagnosis not present

## 2022-01-23 DIAGNOSIS — N644 Mastodynia: Secondary | ICD-10-CM | POA: Diagnosis not present

## 2022-02-06 DIAGNOSIS — R92332 Mammographic heterogeneous density, left breast: Secondary | ICD-10-CM | POA: Diagnosis not present

## 2022-02-06 DIAGNOSIS — N644 Mastodynia: Secondary | ICD-10-CM | POA: Diagnosis not present

## 2022-02-06 DIAGNOSIS — R92333 Mammographic heterogeneous density, bilateral breasts: Secondary | ICD-10-CM | POA: Diagnosis not present

## 2022-04-02 DIAGNOSIS — R252 Cramp and spasm: Secondary | ICD-10-CM | POA: Diagnosis not present

## 2022-04-02 DIAGNOSIS — I10 Essential (primary) hypertension: Secondary | ICD-10-CM | POA: Diagnosis not present

## 2022-04-02 DIAGNOSIS — R7303 Prediabetes: Secondary | ICD-10-CM | POA: Diagnosis not present

## 2022-04-02 DIAGNOSIS — Z8542 Personal history of malignant neoplasm of other parts of uterus: Secondary | ICD-10-CM | POA: Diagnosis not present

## 2022-04-02 DIAGNOSIS — R531 Weakness: Secondary | ICD-10-CM | POA: Diagnosis not present

## 2022-04-02 DIAGNOSIS — N183 Chronic kidney disease, stage 3 unspecified: Secondary | ICD-10-CM | POA: Diagnosis not present

## 2022-04-02 DIAGNOSIS — R4189 Other symptoms and signs involving cognitive functions and awareness: Secondary | ICD-10-CM | POA: Diagnosis not present

## 2022-04-02 DIAGNOSIS — Z6821 Body mass index (BMI) 21.0-21.9, adult: Secondary | ICD-10-CM | POA: Diagnosis not present

## 2022-04-12 DIAGNOSIS — M79605 Pain in left leg: Secondary | ICD-10-CM | POA: Diagnosis not present

## 2022-04-12 DIAGNOSIS — M25552 Pain in left hip: Secondary | ICD-10-CM | POA: Diagnosis not present

## 2022-04-12 DIAGNOSIS — R2681 Unsteadiness on feet: Secondary | ICD-10-CM | POA: Diagnosis not present

## 2022-04-12 DIAGNOSIS — Z9181 History of falling: Secondary | ICD-10-CM | POA: Diagnosis not present

## 2022-04-15 ENCOUNTER — Other Ambulatory Visit: Payer: Self-pay | Admitting: Pain Medicine

## 2022-04-15 ENCOUNTER — Ambulatory Visit
Admission: RE | Admit: 2022-04-15 | Discharge: 2022-04-15 | Disposition: A | Discharge status: Home / Self Care | Payer: Medicare HMO | Source: Ambulatory Visit | Attending: Pain Medicine | Admitting: Pain Medicine

## 2022-04-15 ENCOUNTER — Other Ambulatory Visit: Payer: Self-pay

## 2022-04-15 DIAGNOSIS — M79605 Pain in left leg: Secondary | ICD-10-CM | POA: Diagnosis not present

## 2022-04-15 DIAGNOSIS — W108XXA Fall (on) (from) other stairs and steps, initial encounter: Secondary | ICD-10-CM

## 2022-04-15 DIAGNOSIS — M25552 Pain in left hip: Secondary | ICD-10-CM | POA: Diagnosis not present

## 2022-04-15 DIAGNOSIS — S72012A Unspecified intracapsular fracture of left femur, initial encounter for closed fracture: Secondary | ICD-10-CM | POA: Diagnosis not present

## 2022-04-15 DIAGNOSIS — Z9989 Dependence on other enabling machines and devices: Secondary | ICD-10-CM | POA: Diagnosis not present

## 2022-04-15 DIAGNOSIS — M5136 Other intervertebral disc degeneration, lumbar region: Secondary | ICD-10-CM | POA: Diagnosis not present

## 2022-04-17 ENCOUNTER — Inpatient Hospital Stay (HOSPITAL_COMMUNITY): Payer: Medicare HMO

## 2022-04-17 ENCOUNTER — Other Ambulatory Visit: Payer: Self-pay

## 2022-04-17 ENCOUNTER — Inpatient Hospital Stay (HOSPITAL_COMMUNITY)
Admission: EM | Admit: 2022-04-17 | Discharge: 2022-04-20 | DRG: 522 | Disposition: A | Discharge status: Home / Self Care | Payer: Medicare HMO | Attending: Internal Medicine | Admitting: Internal Medicine

## 2022-04-17 ENCOUNTER — Inpatient Hospital Stay (HOSPITAL_COMMUNITY): Payer: Medicare HMO | Admitting: Anesthesiology

## 2022-04-17 ENCOUNTER — Encounter (HOSPITAL_COMMUNITY): Payer: Self-pay

## 2022-04-17 ENCOUNTER — Encounter (HOSPITAL_COMMUNITY)
Admission: EM | Disposition: A | Discharge status: Home / Self Care | Payer: Self-pay | Source: Home / Self Care | Attending: Internal Medicine

## 2022-04-17 DIAGNOSIS — Z8542 Personal history of malignant neoplasm of other parts of uterus: Secondary | ICD-10-CM | POA: Diagnosis not present

## 2022-04-17 DIAGNOSIS — S728X2A Other fracture of left femur, initial encounter for closed fracture: Principal | ICD-10-CM | POA: Diagnosis present

## 2022-04-17 DIAGNOSIS — Z833 Family history of diabetes mellitus: Secondary | ICD-10-CM

## 2022-04-17 DIAGNOSIS — W1830XA Fall on same level, unspecified, initial encounter: Secondary | ICD-10-CM | POA: Diagnosis present

## 2022-04-17 DIAGNOSIS — Z820 Family history of epilepsy and other diseases of the nervous system: Secondary | ICD-10-CM | POA: Diagnosis not present

## 2022-04-17 DIAGNOSIS — I129 Hypertensive chronic kidney disease with stage 1 through stage 4 chronic kidney disease, or unspecified chronic kidney disease: Secondary | ICD-10-CM | POA: Diagnosis present

## 2022-04-17 DIAGNOSIS — Z90710 Acquired absence of both cervix and uterus: Secondary | ICD-10-CM | POA: Diagnosis not present

## 2022-04-17 DIAGNOSIS — Z96642 Presence of left artificial hip joint: Secondary | ICD-10-CM

## 2022-04-17 DIAGNOSIS — Z9079 Acquired absence of other genital organ(s): Secondary | ICD-10-CM

## 2022-04-17 DIAGNOSIS — N189 Chronic kidney disease, unspecified: Secondary | ICD-10-CM

## 2022-04-17 DIAGNOSIS — E1122 Type 2 diabetes mellitus with diabetic chronic kidney disease: Secondary | ICD-10-CM | POA: Diagnosis not present

## 2022-04-17 DIAGNOSIS — Z89622 Acquired absence of left hip joint: Secondary | ICD-10-CM | POA: Diagnosis not present

## 2022-04-17 DIAGNOSIS — S72012A Unspecified intracapsular fracture of left femur, initial encounter for closed fracture: Principal | ICD-10-CM | POA: Diagnosis present

## 2022-04-17 DIAGNOSIS — N1831 Chronic kidney disease, stage 3a: Secondary | ICD-10-CM | POA: Diagnosis not present

## 2022-04-17 DIAGNOSIS — Z923 Personal history of irradiation: Secondary | ICD-10-CM

## 2022-04-17 DIAGNOSIS — Z7401 Bed confinement status: Secondary | ICD-10-CM | POA: Diagnosis not present

## 2022-04-17 DIAGNOSIS — Z8249 Family history of ischemic heart disease and other diseases of the circulatory system: Secondary | ICD-10-CM | POA: Diagnosis not present

## 2022-04-17 DIAGNOSIS — L89152 Pressure ulcer of sacral region, stage 2: Secondary | ICD-10-CM | POA: Diagnosis present

## 2022-04-17 DIAGNOSIS — D62 Acute posthemorrhagic anemia: Secondary | ICD-10-CM | POA: Diagnosis not present

## 2022-04-17 DIAGNOSIS — Z471 Aftercare following joint replacement surgery: Secondary | ICD-10-CM | POA: Diagnosis not present

## 2022-04-17 DIAGNOSIS — S72002A Fracture of unspecified part of neck of left femur, initial encounter for closed fracture: Secondary | ICD-10-CM | POA: Diagnosis not present

## 2022-04-17 DIAGNOSIS — Z4789 Encounter for other orthopedic aftercare: Secondary | ICD-10-CM | POA: Diagnosis not present

## 2022-04-17 DIAGNOSIS — I10 Essential (primary) hypertension: Secondary | ICD-10-CM | POA: Diagnosis present

## 2022-04-17 DIAGNOSIS — R9431 Abnormal electrocardiogram [ECG] [EKG]: Secondary | ICD-10-CM | POA: Diagnosis not present

## 2022-04-17 DIAGNOSIS — Z90722 Acquired absence of ovaries, bilateral: Secondary | ICD-10-CM | POA: Diagnosis not present

## 2022-04-17 DIAGNOSIS — Z823 Family history of stroke: Secondary | ICD-10-CM | POA: Diagnosis not present

## 2022-04-17 DIAGNOSIS — Z8042 Family history of malignant neoplasm of prostate: Secondary | ICD-10-CM | POA: Diagnosis not present

## 2022-04-17 DIAGNOSIS — Y9301 Activity, walking, marching and hiking: Secondary | ICD-10-CM | POA: Diagnosis present

## 2022-04-17 DIAGNOSIS — M25552 Pain in left hip: Secondary | ICD-10-CM | POA: Diagnosis not present

## 2022-04-17 DIAGNOSIS — S79929A Unspecified injury of unspecified thigh, initial encounter: Secondary | ICD-10-CM | POA: Diagnosis not present

## 2022-04-17 DIAGNOSIS — S728X2D Other fracture of left femur, subsequent encounter for closed fracture with routine healing: Secondary | ICD-10-CM | POA: Diagnosis not present

## 2022-04-17 DIAGNOSIS — L8990 Pressure ulcer of unspecified site, unspecified stage: Secondary | ICD-10-CM | POA: Insufficient documentation

## 2022-04-17 DIAGNOSIS — W19XXXA Unspecified fall, initial encounter: Secondary | ICD-10-CM | POA: Diagnosis not present

## 2022-04-17 DIAGNOSIS — E119 Type 2 diabetes mellitus without complications: Secondary | ICD-10-CM

## 2022-04-17 DIAGNOSIS — R269 Unspecified abnormalities of gait and mobility: Secondary | ICD-10-CM | POA: Diagnosis not present

## 2022-04-17 HISTORY — PX: TOTAL HIP ARTHROPLASTY: SHX124

## 2022-04-17 LAB — CBC WITH DIFFERENTIAL/PLATELET
Abs Immature Granulocytes: 0.02 10*3/uL (ref 0.00–0.07)
Basophils Absolute: 0 10*3/uL (ref 0.0–0.1)
Basophils Relative: 0 %
Eosinophils Absolute: 0 10*3/uL (ref 0.0–0.5)
Eosinophils Relative: 0 %
HCT: 39.9 % (ref 36.0–46.0)
Hemoglobin: 13.3 g/dL (ref 12.0–15.0)
Immature Granulocytes: 0 %
Lymphocytes Relative: 8 %
Lymphs Abs: 0.7 10*3/uL (ref 0.7–4.0)
MCH: 30 pg (ref 26.0–34.0)
MCHC: 33.3 g/dL (ref 30.0–36.0)
MCV: 89.9 fL (ref 80.0–100.0)
Monocytes Absolute: 0.5 10*3/uL (ref 0.1–1.0)
Monocytes Relative: 5 %
Neutro Abs: 8 10*3/uL — ABNORMAL HIGH (ref 1.7–7.7)
Neutrophils Relative %: 87 %
Platelets: 212 10*3/uL (ref 150–400)
RBC: 4.44 MIL/uL (ref 3.87–5.11)
RDW: 13.2 % (ref 11.5–15.5)
WBC: 9.2 10*3/uL (ref 4.0–10.5)
nRBC: 0 % (ref 0.0–0.2)

## 2022-04-17 LAB — TYPE AND SCREEN
ABO/RH(D): O POS
Antibody Screen: NEGATIVE

## 2022-04-17 LAB — BASIC METABOLIC PANEL
Anion gap: 9 (ref 5–15)
BUN: 33 mg/dL — ABNORMAL HIGH (ref 8–23)
CO2: 26 mmol/L (ref 22–32)
Calcium: 10.1 mg/dL (ref 8.9–10.3)
Chloride: 102 mmol/L (ref 98–111)
Creatinine, Ser: 1.12 mg/dL — ABNORMAL HIGH (ref 0.44–1.00)
GFR, Estimated: 51 mL/min — ABNORMAL LOW (ref >60)
Glucose, Bld: 219 mg/dL — ABNORMAL HIGH (ref 70–99)
Potassium: 3.9 mmol/L (ref 3.5–5.1)
Sodium: 137 mmol/L (ref 135–145)

## 2022-04-17 LAB — GLUCOSE, CAPILLARY
Glucose-Capillary: 157 mg/dL — ABNORMAL HIGH (ref 70–99)
Glucose-Capillary: 139 mg/dL — ABNORMAL HIGH (ref 70–99)
Glucose-Capillary: 146 mg/dL — ABNORMAL HIGH (ref 70–99)

## 2022-04-17 LAB — PROTIME-INR
INR: 1.1 (ref 0.8–1.2)
Prothrombin Time: 13.7 seconds (ref 11.4–15.2)

## 2022-04-17 LAB — SURGICAL PCR SCREEN
MRSA, PCR: NEGATIVE
Staphylococcus aureus: NEGATIVE

## 2022-04-17 SURGERY — ARTHROPLASTY, HIP, TOTAL,POSTERIOR APPROACH
Anesthesia: Monitor Anesthesia Care | Site: Hip | Laterality: Left

## 2022-04-17 MED ORDER — SODIUM CHLORIDE (PF) 0.9 % IJ SOLN
INTRAMUSCULAR | Status: AC
  Filled 2022-04-17: qty 50

## 2022-04-17 MED ORDER — PROPOFOL 500 MG/50ML IV EMUL
INTRAVENOUS | Status: DC | PRN
  Administered 2022-04-17: 75 ug/kg/min via INTRAVENOUS
  Administered 2022-04-17: 15 mg via INTRAVENOUS
  Administered 2022-04-17: 10 mg via INTRAVENOUS

## 2022-04-17 MED ORDER — AMISULPRIDE (ANTIEMETIC) 5 MG/2ML IV SOLN: 10.0000 mg | Freq: Once | INTRAVENOUS | Status: DC | PRN

## 2022-04-17 MED ORDER — FENTANYL CITRATE (PF) 100 MCG/2ML IJ SOLN
INTRAMUSCULAR | Status: AC
  Filled 2022-04-17: qty 2

## 2022-04-17 MED ORDER — KETOROLAC TROMETHAMINE 30 MG/ML IJ SOLN
INTRAMUSCULAR | Status: AC
  Filled 2022-04-17: qty 1

## 2022-04-17 MED ORDER — TRAZODONE HCL 50 MG PO TABS: 25.0000 mg | ORAL_TABLET | Freq: Every evening | ORAL | Status: DC | PRN

## 2022-04-17 MED ORDER — ONDANSETRON HCL 4 MG PO TABS: 4.0000 mg | ORAL_TABLET | Freq: Four times a day (QID) | ORAL | Status: DC | PRN

## 2022-04-17 MED ORDER — METHOCARBAMOL 1000 MG/10ML IJ SOLN: 500.0000 mg | Freq: Four times a day (QID) | INTRAVENOUS | Status: DC | PRN

## 2022-04-17 MED ORDER — MENTHOL 3 MG MT LOZG: 1.0000 | LOZENGE | OROMUCOSAL | Status: DC | PRN

## 2022-04-17 MED ORDER — DOCUSATE SODIUM 100 MG PO CAPS
100.0000 mg | ORAL_CAPSULE | Freq: Two times a day (BID) | ORAL | Status: DC
  Administered 2022-04-17 – 2022-04-20 (×5): 100 mg via ORAL
  Filled 2022-04-17 (×6): qty 1

## 2022-04-17 MED ORDER — ENOXAPARIN SODIUM 40 MG/0.4ML IJ SOSY: 40.0000 mg | PREFILLED_SYRINGE | INTRAMUSCULAR | Status: DC

## 2022-04-17 MED ORDER — TRANEXAMIC ACID-NACL 1000-0.7 MG/100ML-% IV SOLN
1000.0000 mg | INTRAVENOUS | Status: AC
  Administered 2022-04-17: 1000 mg via INTRAVENOUS
  Filled 2022-04-17: qty 100

## 2022-04-17 MED ORDER — PHENYLEPHRINE HCL-NACL 20-0.9 MG/250ML-% IV SOLN
INTRAVENOUS | Status: DC | PRN
  Administered 2022-04-17: 30 ug/min via INTRAVENOUS

## 2022-04-17 MED ORDER — CHLORHEXIDINE GLUCONATE 0.12 % MT SOLN
15.0000 mL | Freq: Once | OROMUCOSAL | Status: AC
  Administered 2022-04-17: 15 mL via OROMUCOSAL

## 2022-04-17 MED ORDER — ONDANSETRON HCL 4 MG/2ML IJ SOLN
INTRAMUSCULAR | Status: DC | PRN
  Administered 2022-04-17: 4 mg via INTRAVENOUS

## 2022-04-17 MED ORDER — ORAL CARE MOUTH RINSE: 15.0000 mL | Freq: Once | OROMUCOSAL | Status: AC

## 2022-04-17 MED ORDER — DOCUSATE SODIUM 100 MG PO CAPS: 100.0000 mg | ORAL_CAPSULE | Freq: Two times a day (BID) | ORAL | Status: DC

## 2022-04-17 MED ORDER — SODIUM CHLORIDE (PF) 0.9 % IJ SOLN
INTRAMUSCULAR | Status: DC | PRN
  Administered 2022-04-17: 60 mL

## 2022-04-17 MED ORDER — METOCLOPRAMIDE HCL 5 MG/ML IJ SOLN: 5.0000 mg | Freq: Three times a day (TID) | INTRAMUSCULAR | Status: DC | PRN

## 2022-04-17 MED ORDER — ALBUTEROL SULFATE (2.5 MG/3ML) 0.083% IN NEBU: 2.5000 mg | INHALATION_SOLUTION | RESPIRATORY_TRACT | Status: DC | PRN

## 2022-04-17 MED ORDER — MORPHINE SULFATE (PF) 2 MG/ML IV SOLN: 2.0000 mg | INTRAVENOUS | Status: DC | PRN

## 2022-04-17 MED ORDER — ACETAMINOPHEN 650 MG RE SUPP: 650.0000 mg | Freq: Four times a day (QID) | RECTAL | Status: DC | PRN

## 2022-04-17 MED ORDER — ENOXAPARIN SODIUM 30 MG/0.3ML IJ SOSY
30.0000 mg | PREFILLED_SYRINGE | INTRAMUSCULAR | Status: DC
  Administered 2022-04-18 – 2022-04-19 (×2): 30 mg via SUBCUTANEOUS
  Filled 2022-04-17 (×3): qty 0.3

## 2022-04-17 MED ORDER — POVIDONE-IODINE 10 % EX SWAB: 2.0000 | Freq: Once | CUTANEOUS | Status: DC

## 2022-04-17 MED ORDER — FENTANYL CITRATE (PF) 100 MCG/2ML IJ SOLN
INTRAMUSCULAR | Status: DC | PRN
  Administered 2022-04-17: 100 ug via INTRAVENOUS

## 2022-04-17 MED ORDER — 0.9 % SODIUM CHLORIDE (POUR BTL) OPTIME
TOPICAL | Status: DC | PRN
  Administered 2022-04-17: 1000 mL

## 2022-04-17 MED ORDER — ONDANSETRON HCL 4 MG/2ML IJ SOLN: 4.0000 mg | Freq: Four times a day (QID) | INTRAMUSCULAR | Status: DC | PRN

## 2022-04-17 MED ORDER — SODIUM CHLORIDE 0.9 % IV SOLN: INTRAVENOUS | Status: DC

## 2022-04-17 MED ORDER — INSULIN ASPART 100 UNIT/ML IJ SOLN
0.0000 [IU] | Freq: Every day | INTRAMUSCULAR | Status: DC
  Filled 2022-04-17: qty 0.05

## 2022-04-17 MED ORDER — METHOCARBAMOL 500 MG PO TABS
500.0000 mg | ORAL_TABLET | Freq: Four times a day (QID) | ORAL | Status: DC | PRN
  Administered 2022-04-19: 500 mg via ORAL
  Filled 2022-04-17: qty 1

## 2022-04-17 MED ORDER — OXYCODONE HCL 5 MG PO TABS: 5.0000 mg | ORAL_TABLET | Freq: Once | ORAL | Status: DC | PRN

## 2022-04-17 MED ORDER — MORPHINE SULFATE (PF) 2 MG/ML IV SOLN: 0.5000 mg | INTRAVENOUS | Status: DC | PRN

## 2022-04-17 MED ORDER — BISACODYL 10 MG RE SUPP: 10.0000 mg | Freq: Every day | RECTAL | Status: DC | PRN

## 2022-04-17 MED ORDER — POLYETHYLENE GLYCOL 3350 17 G PO PACK: 17.0000 g | PACK | Freq: Every day | ORAL | Status: DC | PRN

## 2022-04-17 MED ORDER — POLYETHYLENE GLYCOL 3350 17 G PO PACK
17.0000 g | PACK | Freq: Two times a day (BID) | ORAL | Status: DC
  Administered 2022-04-17 – 2022-04-20 (×5): 17 g via ORAL
  Filled 2022-04-17 (×6): qty 1

## 2022-04-17 MED ORDER — ONDANSETRON HCL 4 MG/2ML IJ SOLN
INTRAMUSCULAR | Status: AC
  Filled 2022-04-17: qty 2

## 2022-04-17 MED ORDER — OXYCODONE HCL 5 MG/5ML PO SOLN: 5.0000 mg | Freq: Once | ORAL | Status: DC | PRN

## 2022-04-17 MED ORDER — METOCLOPRAMIDE HCL 5 MG PO TABS: 5.0000 mg | ORAL_TABLET | Freq: Three times a day (TID) | ORAL | Status: DC | PRN

## 2022-04-17 MED ORDER — BUPIVACAINE HCL (PF) 0.25 % IJ SOLN
INTRAMUSCULAR | Status: AC
  Filled 2022-04-17: qty 30

## 2022-04-17 MED ORDER — STERILE WATER FOR IRRIGATION IR SOLN
Status: DC | PRN
  Administered 2022-04-17: 2000 mL

## 2022-04-17 MED ORDER — HYDROCODONE-ACETAMINOPHEN 5-325 MG PO TABS
1.0000 | ORAL_TABLET | ORAL | Status: DC | PRN
  Administered 2022-04-18 – 2022-04-20 (×5): 1 via ORAL
  Filled 2022-04-17 (×5): qty 1

## 2022-04-17 MED ORDER — ACETAMINOPHEN 325 MG PO TABS: 325.0000 mg | ORAL_TABLET | Freq: Four times a day (QID) | ORAL | Status: DC | PRN

## 2022-04-17 MED ORDER — DIPHENHYDRAMINE HCL 12.5 MG/5ML PO ELIX: 12.5000 mg | ORAL_SOLUTION | ORAL | Status: DC | PRN

## 2022-04-17 MED ORDER — TRANEXAMIC ACID-NACL 1000-0.7 MG/100ML-% IV SOLN
1000.0000 mg | Freq: Once | INTRAVENOUS | Status: AC
  Administered 2022-04-17: 1000 mg via INTRAVENOUS
  Filled 2022-04-17: qty 100

## 2022-04-17 MED ORDER — ASPIRIN 81 MG PO CHEW
81.0000 mg | CHEWABLE_TABLET | Freq: Two times a day (BID) | ORAL | Status: DC
Start: 2022-04-17 — End: 2022-04-17

## 2022-04-17 MED ORDER — INSULIN ASPART 100 UNIT/ML IJ SOLN
0.0000 [IU] | Freq: Three times a day (TID) | INTRAMUSCULAR | Status: DC
  Administered 2022-04-18: 2 [IU] via SUBCUTANEOUS
  Filled 2022-04-17: qty 0.15

## 2022-04-17 MED ORDER — PHENOL 1.4 % MT LIQD: 1.0000 | OROMUCOSAL | Status: DC | PRN

## 2022-04-17 MED ORDER — BUPIVACAINE HCL (PF) 0.75 % IJ SOLN
INTRAMUSCULAR | Status: DC | PRN
  Administered 2022-04-17: 1.6 mL via INTRATHECAL

## 2022-04-17 MED ORDER — ACETAMINOPHEN 325 MG PO TABS: 650.0000 mg | ORAL_TABLET | Freq: Four times a day (QID) | ORAL | Status: DC | PRN

## 2022-04-17 MED ORDER — BUPIVACAINE HCL (PF) 0.25 % IJ SOLN
INTRAMUSCULAR | Status: DC | PRN
  Administered 2022-04-17: 30 mL

## 2022-04-17 MED ORDER — CHLORHEXIDINE GLUCONATE 4 % EX LIQD: 60.0000 mL | Freq: Once | CUTANEOUS | Status: DC

## 2022-04-17 MED ORDER — CEFAZOLIN SODIUM-DEXTROSE 2-4 GM/100ML-% IV SOLN
2.0000 g | INTRAVENOUS | Status: AC
  Administered 2022-04-17: 2 g via INTRAVENOUS
  Filled 2022-04-17: qty 100

## 2022-04-17 MED ORDER — LACTATED RINGERS IV SOLN: INTRAVENOUS | Status: DC

## 2022-04-17 MED ORDER — DEXAMETHASONE SODIUM PHOSPHATE 10 MG/ML IJ SOLN
10.0000 mg | Freq: Once | INTRAMUSCULAR | Status: AC
  Administered 2022-04-18: 10 mg via INTRAVENOUS
  Filled 2022-04-17: qty 1

## 2022-04-17 MED ORDER — CEFAZOLIN SODIUM-DEXTROSE 2-4 GM/100ML-% IV SOLN
2.0000 g | Freq: Four times a day (QID) | INTRAVENOUS | Status: AC
  Administered 2022-04-17 – 2022-04-18 (×2): 2 g via INTRAVENOUS
  Filled 2022-04-17 (×2): qty 100

## 2022-04-17 MED ORDER — PHENYLEPHRINE HCL (PRESSORS) 10 MG/ML IV SOLN
INTRAVENOUS | Status: AC
  Filled 2022-04-17: qty 1

## 2022-04-17 MED ORDER — LOSARTAN POTASSIUM 50 MG PO TABS
50.0000 mg | ORAL_TABLET | Freq: Every day | ORAL | Status: DC
  Administered 2022-04-18 – 2022-04-20 (×3): 50 mg via ORAL
  Filled 2022-04-17 (×3): qty 1

## 2022-04-17 MED ORDER — ONDANSETRON HCL 4 MG/2ML IJ SOLN: 4.0000 mg | Freq: Once | INTRAMUSCULAR | Status: DC | PRN

## 2022-04-17 MED ORDER — KETOROLAC TROMETHAMINE 30 MG/ML IJ SOLN
INTRAMUSCULAR | Status: DC | PRN
  Administered 2022-04-17: 30 mg

## 2022-04-17 MED ORDER — GLYCOPYRROLATE 0.2 MG/ML IJ SOLN
INTRAMUSCULAR | Status: DC | PRN
  Administered 2022-04-17 (×2): .1 mg via INTRAVENOUS

## 2022-04-17 MED ORDER — ACETAMINOPHEN 500 MG PO TABS
1000.0000 mg | ORAL_TABLET | Freq: Once | ORAL | Status: AC
  Administered 2022-04-17: 1000 mg via ORAL
  Filled 2022-04-17: qty 2

## 2022-04-17 MED ORDER — HYDROMORPHONE HCL 1 MG/ML IJ SOLN: 0.2500 mg | INTRAMUSCULAR | Status: DC | PRN

## 2022-04-17 MED ORDER — PHENYLEPHRINE HCL (PRESSORS) 10 MG/ML IV SOLN
INTRAVENOUS | Status: DC | PRN
  Administered 2022-04-17: 160 ug via INTRAVENOUS

## 2022-04-17 SURGICAL SUPPLY — 73 items
ADH SKN CLS APL DERMABOND .7 (GAUZE/BANDAGES/DRESSINGS) ×3
BAG COUNTER SPONGE SURGICOUNT (BAG) IMPLANT
BAG DECANTER FOR FLEXI CONT (MISCELLANEOUS) ×4 IMPLANT
BAG SPEC THK2 15X12 ZIP CLS (MISCELLANEOUS) ×3
BAG SPNG CNTER NS LX DISP (BAG)
BAG ZIPLOCK 12X15 (MISCELLANEOUS) ×6 IMPLANT
BLADE SAW SAG 13X.89X90 PERFRM (BLADE) IMPLANT
BLADE SAW SGTL 11.0X1.19X90.0M (BLADE) IMPLANT
BLADE SAW SGTL 18X1.27X75 (BLADE) ×2 IMPLANT
BLADE SURG SZ10 CARB STEEL (BLADE) ×8 IMPLANT
CLOTH BEACON ORANGE TIMEOUT ST (SAFETY) ×2 IMPLANT
COVER PERINEAL POST (MISCELLANEOUS) ×2 IMPLANT
COVER SURGICAL LIGHT HANDLE (MISCELLANEOUS) ×4 IMPLANT
CUP ACET PINNACLE SECTR 50MM (Hips) IMPLANT
DERMABOND ADVANCED .7 DNX12 (GAUZE/BANDAGES/DRESSINGS) ×6 IMPLANT
DRAPE C-ARM 42X120 X-RAY (DRAPES) ×2 IMPLANT
DRAPE INCISE IOBAN 66X45 STRL (DRAPES) ×2 IMPLANT
DRAPE ORTHO SPLIT 77X108 STRL (DRAPES) ×2
DRAPE POUCH INSTRU U-SHP 10X18 (DRAPES) ×2 IMPLANT
DRAPE STERI IOBAN 125X83 (DRAPES) ×4 IMPLANT
DRAPE SURG 17X11 SM STRL (DRAPES) ×2 IMPLANT
DRAPE SURG ORHT 6 SPLT 77X108 (DRAPES) ×4 IMPLANT
DRAPE U-SHAPE 47X51 STRL (DRAPES) ×16 IMPLANT
DRESSING AQUACEL AG SP 3.5X10 (GAUZE/BANDAGES/DRESSINGS) ×6 IMPLANT
DRSG AQUACEL AG SP 3.5X10 (GAUZE/BANDAGES/DRESSINGS) ×3
DURAPREP 26ML APPLICATOR (WOUND CARE) ×6 IMPLANT
ELECT BLADE TIP CTD 4 INCH (ELECTRODE) ×4 IMPLANT
ELECT REM PT RETURN 15FT ADLT (MISCELLANEOUS) ×6 IMPLANT
FACESHIELD WRAPAROUND (MASK) ×5 IMPLANT
FACESHIELD WRAPAROUND OR TEAM (MASK) ×16 IMPLANT
GLOVE BIO SURGEON STRL SZ 6 (GLOVE) ×2 IMPLANT
GLOVE BIOGEL M 7.0 STRL (GLOVE) IMPLANT
GLOVE BIOGEL PI IND STRL 6.5 (GLOVE) ×2 IMPLANT
GLOVE BIOGEL PI IND STRL 7.5 (GLOVE) ×14 IMPLANT
GLOVE BIOGEL PI IND STRL 8.5 (GLOVE) ×4 IMPLANT
GLOVE ECLIPSE 8.0 STRL XLNG CF (GLOVE) ×4 IMPLANT
GLOVE INDICATOR 6.5 STRL GRN (GLOVE) ×2 IMPLANT
GLOVE ORTHO TXT STRL SZ7.5 (GLOVE) ×4 IMPLANT
GOWN STRL REUS W/ TWL LRG LVL3 (GOWN DISPOSABLE) ×8 IMPLANT
GOWN STRL REUS W/TWL LRG LVL3 (GOWN DISPOSABLE) ×4
HEAD FEM STD 32X+1 STRL (Hips) IMPLANT
KIT BASIN OR (CUSTOM PROCEDURE TRAY) ×2 IMPLANT
KIT TURNOVER KIT A (KITS) IMPLANT
LINER ACET PNNCL PLUS4 NEUTRAL (Hips) IMPLANT
MANIFOLD NEPTUNE II (INSTRUMENTS) ×2 IMPLANT
MARKER SKIN DUAL TIP RULER LAB (MISCELLANEOUS) ×4 IMPLANT
NDL SAFETY ECLIP 18X1.5 (MISCELLANEOUS) ×2 IMPLANT
NS IRRIG 1000ML POUR BTL (IV SOLUTION) ×2 IMPLANT
PACK ANTERIOR HIP CUSTOM (KITS) ×2 IMPLANT
PACK TOTAL JOINT (CUSTOM PROCEDURE TRAY) ×2 IMPLANT
PACK UNIVERSAL I (CUSTOM PROCEDURE TRAY) IMPLANT
PADDING CAST COTTON 6X4 STRL (CAST SUPPLIES) ×2 IMPLANT
PINNACLE PLUS 4 NEUTRAL (Hips) ×1 IMPLANT
PINNACLE SECTOR CUP 50MM (Hips) ×1 IMPLANT
PROTECTOR NERVE ULNAR (MISCELLANEOUS) ×2 IMPLANT
SAW OSC TIP CART 19.5X105X1.3 (SAW) ×4 IMPLANT
SCREW 6.5MMX40MM (Screw) IMPLANT
SLEEVE SUCTION 125 (MISCELLANEOUS) ×4 IMPLANT
STEM FEMORAL SZ5 HIGH ACTIS (Stem) IMPLANT
SUCTION FRAZIER HANDLE 10FR (MISCELLANEOUS) ×1
SUCTION TUBE FRAZIER 10FR DISP (MISCELLANEOUS) ×2 IMPLANT
SUT MNCRL AB 4-0 PS2 18 (SUTURE) ×6 IMPLANT
SUT STRATAFIX 0 PDS 27 VIOLET (SUTURE) ×1
SUT VIC AB 1 CT1 36 (SUTURE) ×18 IMPLANT
SUT VIC AB 2-0 CT1 27 (SUTURE) ×6
SUT VIC AB 2-0 CT1 TAPERPNT 27 (SUTURE) ×12 IMPLANT
SUT VLOC 180 0 24IN GS25 (SUTURE) ×4 IMPLANT
SUTURE STRATFX 0 PDS 27 VIOLET (SUTURE) ×2 IMPLANT
SYR 50ML LL SCALE MARK (SYRINGE) ×4 IMPLANT
TOWEL OR 17X26 10 PK STRL BLUE (TOWEL DISPOSABLE) ×4 IMPLANT
TRAY FOLEY MTR SLVR 16FR STAT (SET/KITS/TRAYS/PACK) ×4 IMPLANT
TUBE SUCTION HIGH CAP CLEAR NV (SUCTIONS) ×2 IMPLANT
WATER STERILE IRR 1000ML POUR (IV SOLUTION) ×2 IMPLANT

## 2022-04-17 NOTE — ED Provider Notes (Signed)
[facility] Provider Note   CSN: [id number] Arrival date & time: 04/17/22  1046     History  Chief Complaint  Patient presents with   Leg Pain    Jamie Macdonald is a 77 y.o. female with a past medical history significant for diabetes, hypertension, and CKD who presents to the ED due to left femur fracture.  Patient fell 3 weeks ago and was evaluated by PCP and advised to start physical therapy.  Patient's physical therapist noticed that her legs were different lengths and a x-ray was ordered.  X-ray performed on 4/1 demonstrates a left femur fracture.  Daughter at bedside states that patient has been using a cane to ambulate and just got a wheelchair from a friend. Ambulated without assistance prior to fall. Lives alone. Patient admits to severe pain in her left hip, worse with movement.  Patient is unsure how she fell.  Daughter notes that patient was walking to church and fell.  No head injury or loss of consciousness.  Not on any blood thinners. No further falls.  History obtained from patient and past medical records. No interpreter used during encounter.       Home Medications Prior to Admission medications   Medication Sig Start Date End Date Taking? Authorizing Provider  ACCU-CHEK AVIVA PLUS test strip  04/08/18   [provider]  Accu-Chek Softclix Lancets lancets  04/08/18   [provider]  Blood Glucose Monitoring Suppl (ACCU-CHEK AVIVA PLUS) w/Device KIT  04/08/18   [provider]  losartan (COZAAR) 50 MG tablet Take by mouth. 08/12/18   [provider]  Multiple Vitamins-Minerals (GNP DIABETIC SUPPORT FORMULA) TABS Take 2 tablets by mouth.    [provider]  UNABLE TO FIND Place 1 suppository vaginally 2 (two) times daily. Med Name: Vitamin E Vaginal Suppositories from [place]    [provider]  vitamin E 180 MG (400 UNITS) capsule Take 400 Units by mouth daily.     [provider]      Allergies    Nsaids, Aspirin, Estradiol, and Statins    Review of Systems   Review of Systems  Musculoskeletal:  Positive for arthralgias and gait problem.    Physical Exam Updated Vital Signs BP (!) 149/81   Pulse 99   Temp 98.4 F (36.9 C) (Oral)   Resp 17   Ht 4\' 10"  (1.473 m)   Wt 49.9 kg   SpO2 100%   BMI 22.99 kg/m  Physical Exam Vitals and nursing note reviewed.  Constitutional:      General: She is not in acute distress.    Appearance: She is not ill-appearing.  HENT:     Head: Normocephalic.  Eyes:     Pupils: Pupils are equal, round, and reactive to light.  Cardiovascular:     Rate and Rhythm: Normal rate and regular rhythm.     Pulses: Normal pulses.     Heart sounds: Normal heart sounds. No murmur heard.    No friction rub. No gallop.  Pulmonary:     Effort: Pulmonary effort is normal.     Breath sounds: Normal breath sounds.  Abdominal:     General: Abdomen is flat. There is no distension.     Palpations: Abdomen is soft.     Tenderness: There is no abdominal tenderness. There is no guarding or rebound.  Musculoskeletal:        General: Normal range of motion.  Cervical back: Neck supple.     Comments: Decreased ROM of left hip. Shortened LLE. Some bony tenderness to left hip. Pedal pulses palpable. Soft compartments.   Skin:    General: Skin is warm and dry.  Neurological:     General: No focal deficit present.     Mental Status: She is alert.  Psychiatric:        Mood and Affect: Mood normal.        Behavior: Behavior normal.     ED Results / Procedures / Treatments   Labs (all labs ordered are listed, but only abnormal results are displayed) Labs Reviewed  BASIC METABOLIC PANEL - Abnormal; Notable for the following components:      Result Value   Glucose, Bld 219 (*)    BUN 33 (*)    Creatinine, Ser 1.12 (*)    GFR, Estimated 51 (*)    All other components within normal limits  CBC WITH  DIFFERENTIAL/PLATELET - Abnormal; Notable for the following components:   Neutro Abs 8.0 (*)    All other components within normal limits  PROTIME-INR  TYPE AND SCREEN    EKG EKG Interpretation  Date/Time:  Wednesday April 17 2022 12:15:21 EDT Ventricular Rate:  93 PR Interval:  133 QRS Duration: 90 QT Interval:  399 QTC Calculation: 497 R Axis:   49 Text Interpretation: Sinus rhythm Nonspecific T abnormalities, lateral leads Borderline prolonged QT interval Confirmed by Dene Gentry [phone]) on 04/17/2022 12:58:45 PM  Radiology No results found.  Procedures Procedures    Medications Ordered in ED Medications - No data to display  ED Course/ Medical Decision Making/ A&P                             Medical Decision Making Amount and/or Complexity of Data Reviewed Independent Historian: caregiver    Details: Both daughter's at bedside Labs: ordered. Decision-making details documented in ED Course. Radiology: independent interpretation performed. Decision-making details documented in ED Course. ECG/medicine tests: ordered and independent interpretation performed. Decision-making details documented in ED Course.  Risk Decision regarding hospitalization.   This patient presents to the ED for concern of hip pain, this involves an extensive number of treatment options, and is a complaint that carries with it a high risk of complications and morbidity.  The differential diagnosis includes fracture, compartment syndrome, muscular strain, etc  77 year old female presents to the ED due to a left femur fracture seen on x-ray from 4/1.  Patient had a mechanical fall 3 weeks ago.  Has been having hip pain ever since.  Not currently any blood thinners.  Since the fall patient has been ambulating with cane and wheelchair.  Prior to fall ambulated without assistance.  Lives alone.  Last p.o. intake was before 8 AM this morning.  Upon arrival, stable vitals.  Patient in no acute distress.   Shortened left lower extremity.  Left lower extremity neurovascularly intact with soft compartments.  Low suspicion for compartment syndrome.  X-ray from 4/1 personally reviewed and interpreted demonstrates displaced subcapital fracture of the left femur. Labs ordered.   CBC reassuring.  No leukocytosis.  Normal hemoglobin.  INR/PTT within normal limits.  BMP significant for hyperglycemia 219.  No anion gap.  Doubt DKA.  Elevated creatinine 1.12 and BUN at 33.  No major electrolyte derangements.  Reassessed patient, patient resting comfortably in bed. Declined any pain medication.  12:11 PM Discussed with Dr. Doran Durand with orthopedics who  recommends hospitalist admission. Ortho will see today to determine when patient will be taken to OR.   Discussed with Genevie Cheshire with orthopedics. Patient will go to OR today at 5:30PM. Keep NPO. No anticoagulants. Will consult hospitalist for admission.   1:42 PM Discussed with Dr. Renaee Munda with [facility] who agrees to admit patient.   Has PCP Hx HTN        Final Clinical Impression(s) / ED Diagnoses Final diagnoses:  Other fracture of left femur, initial encounter for closed fracture  Fall, initial encounter    Rx / DC Orders ED Discharge Orders     None         Karie Kirks 04/17/22 1344    Valarie Merino, MD 04/18/22 1551

## 2022-04-17 NOTE — Transfer of Care (Signed)
Immediate Anesthesia Transfer of Care Note  Patient: Jamie Macdonald  Procedure(s) Performed: TOTAL HIP ARTHROPLASTY ANTERIOR APPROCH (Left: Hip)  Patient Location: PACU  Anesthesia Type:Spinal  Level of Consciousness: drowsy  Airway & Oxygen Therapy: Patient Spontanous Breathing and Patient connected to face mask oxygen  Post-op Assessment: Report given to RN and Post -op Vital signs reviewed and stable  Post vital signs: Reviewed and stable  Last Vitals:  Vitals Value Taken Time  BP 95/67 04/17/22 1915  Temp    Pulse 86 04/17/22 1916  Resp 13 04/17/22 1916  SpO2 100 % 04/17/22 1916  Vitals shown include unvalidated device data.  Last Pain:  Vitals:   04/17/22 1600  TempSrc:   PainSc: 0-No pain         Complications: No notable events documented.

## 2022-04-17 NOTE — Interval H&P Note (Signed)
History and Physical Interval Note:  04/17/2022 5:21 PM  Jamie Macdonald  has presented today for surgery, with the diagnosis of LEFT FEMUR FRACTURE.  The various methods of treatment have been discussed with the patient and family. After consideration of risks, benefits and other options for treatment, the patient has consented to  Procedure(s): TOTAL HIP ARTHROPLASTY ANTERIOR APPROCH (Left) as a surgical intervention.  The patient's history has been reviewed, patient examined, no change in status, stable for surgery.  I have reviewed the patient's chart and labs.  Questions were answered to the patient's satisfaction.     Mauri Pole

## 2022-04-17 NOTE — H&P (Signed)
History and Physical  Jamie Macdonald [id number] DOB: 10/19/1945 DOA: 04/17/2022  PCP: Kathyrn Lass, MD   Chief Complaint: left leg pain   HPI: Jamie Macdonald is a 77 y.o. female with medical history CKD, hypertension, diabetes admitted to the hospital with left femur fracture.  Apparently she fell 3 weeks ago, having some pain and sent to physical therapy.  X-ray was ordered on 4 1 that demonstrated left femur fracture.  Patient lives alone, ambulated without assistance prior to fall, the last few weeks she has been ambulating with a cane and wheelchair.  Endorses pretty severe pain in the left hip, was walking to church and fell.  No head injury at that time or loss of consciousness.  Not on any blood thinners.  ED Course: Lab work unremarkable, hip x-ray confirms fracture as noted below.  Review of Systems: Please see HPI for pertinent positives and negatives. A complete 10 system review of systems are otherwise negative.  Past Medical History:  Diagnosis Date   Anemia    Cancer    ENDOMETRIAL CANCER   Chronic kidney disease    ckd STAGE 3LOV DR [facility] 12-12-17   Diabetes mellitus    Diabetes mellitus type 2 in nonobese 01/16/2018   Essential hypertension 01/16/2018   Exposure to hepatitis C    AS CHILD   Exposure to TB 1973   AS CHILD   Fatty tumor    LEFT SHOULDER REMOVED   Fibroids    History of radiation therapy 05/07/2018-06/04/2018   vaginal brachytherapy   Dr Sondra Come   Hypertension    SLIGHT   Uterine prolapse 01/16/2018   UTI (urinary tract infection)    STARTED AMOXICILLIAN FRIDAY 03-13-2018   Past Surgical History:  Procedure Laterality Date   CERVICAL CONING  74 OR 75   DILATATION & CURETTAGE/HYSTEROSCOPY WITH MYOSURE N/A 01/30/2018   Procedure: DILATATION & CURETTAGE/HYSTEROSCOPY WITH MYOSURE;  Surgeon: Janyth Pupa, DO;  Location: [facility];  Service: Gynecology;  Laterality: N/A;   ROBOTIC ASSISTED LAPAROSCOPIC SACROCOLPOPEXY N/A 03/19/2018    Procedure: XI ROBOTIC ASSISTED LAPAROSCOPIC SACROCOLPOPEXY;  Surgeon: Ardis Hughs, MD;  Location: [facility];  Service: Urology;  Laterality: N/A;   ROBOTIC ASSISTED TOTAL HYSTERECTOMY WITH BILATERAL SALPINGO OOPHERECTOMY Bilateral 03/19/2018   Procedure: XI ROBOTIC ASSISTED TOTAL HYSTERECTOMY WITH BILATERAL SALPINGO OOPHORECTOMY;  Surgeon: Everitt Amber, MD;  Location: [facility];  Service: Gynecology;  Laterality: Bilateral;   SENTINEL NODE BIOPSY Bilateral 03/19/2018   Procedure: SENTINEL NODE BIOPSY;  Surgeon: Everitt Amber, MD;  Location: [facility];  Service: Gynecology;  Laterality: Bilateral;    Social History:  reports that she has never smoked. She has never used smokeless tobacco. She reports that she does not drink alcohol and does not use drugs.   Allergies  Allergen Reactions   Nsaids Other (See Comments)    Due to chronic kidney disease pt does not take any celebrex, motrin, aleve etc.   Aspirin     Other reaction(s): no supposed to take   Estradiol     Other reaction(s): severe allergic reaction   Statins     Other reaction(s): her nephrologist does not want her on statins unless her cholesterol is elevated.    Family History  Problem Relation Age of Onset   Stroke Mother    Parkinson's disease Mother    Heart disease Father        HEART ATTACK   Diabetes Father    Prostate cancer Father  Diabetes Sister    Cancer Sister      Prior to Admission medications   Medication Sig Start Date End Date Taking? Authorizing Provider  ACCU-CHEK AVIVA PLUS test strip  04/08/18   [provider]  Accu-Chek Softclix Lancets lancets  04/08/18   [provider]  Blood Glucose Monitoring Suppl (ACCU-CHEK AVIVA PLUS) w/Device KIT  04/08/18   [provider]  losartan (COZAAR) 50 MG tablet Take by mouth. 08/12/18   [provider]  Multiple Vitamins-Minerals (GNP DIABETIC SUPPORT FORMULA) TABS Take 2 tablets by mouth.    [provider]  UNABLE TO  FIND Place 1 suppository vaginally 2 (two) times daily. Med Name: Vitamin E Vaginal Suppositories from [place]    [provider]  vitamin E 180 MG (400 UNITS) capsule Take 400 Units by mouth daily.    [provider]    Physical Exam: BP (!) 149/81   Pulse 99   Temp 98.4 F (36.9 C) (Oral)   Resp 17   Ht 4\' 10"  (1.473 m)   Wt 49.9 kg   SpO2 100%   BMI 22.99 kg/m   General:  Alert, oriented, calm, in no acute distress  Eyes: EOMI, clear conjuctivae, white sclerea Neck: supple, no masses, trachea mildline  Cardiovascular: RRR, no murmurs or rubs, no peripheral edema  Respiratory: clear to auscultation bilaterally, no wheezes, no crackles  Abdomen: soft, nontender, nondistended, normal bowel tones heard  Skin: dry, no rashes  Musculoskeletal: no joint effusions, normal range of motion  Psychiatric: appropriate affect, normal speech  Neurologic: extraocular muscles intact, clear speech, moving all extremities with intact sensorium          Labs on Admission:  Basic Metabolic Panel: Recent Labs  Lab 04/17/22 1156  NA 137  K 3.9  CL 102  CO2 26  GLUCOSE 219*  BUN 33*  CREATININE 1.12*  CALCIUM 10.1   Liver Function Tests: No results for input(s): "AST", "ALT", "ALKPHOS", "BILITOT", "PROT", "ALBUMIN" in the last 168 hours. No results for input(s): "LIPASE", "AMYLASE" in the last 168 hours. No results for input(s): "AMMONIA" in the last 168 hours. CBC: Recent Labs  Lab 04/17/22 1156  WBC 9.2  NEUTROABS 8.0*  HGB 13.3  HCT 39.9  MCV 89.9  PLT 212   Cardiac Enzymes: No results for input(s): "CKTOTAL", "CKMB", "CKMBINDEX", "TROPONINI" in the last 168 hours.  BNP (last 3 results) No results for input(s): "BNP" in the last 8760 hours.  ProBNP (last 3 results) No results for input(s): "PROBNP" in the last 8760 hours.  CBG: No results for input(s): "GLUCAP" in the last 168 hours.  Radiological Exams on Admission: Hip x-ray from  4/1: Displaced subcapital fracture LEFT femur.   Ingested radiopacities of uncertain etiology within stool.    Assessment/Plan Principal Problem:   Other fracture of left femur, initial encounter for closed fracture-due to mechanical fall, no head injury or other injury at the time -Inpatient admission -Pain and nausea control -Plan for OR this afternoon -.  Postoperative DVT prophylaxis, therapy, rehabilitation etc. per orthopedic surgery Active Problems:   Essential hypertension-will continue home losartan   Diabetes mellitus type 2 in nonobese patient is a diet-controlled diabetic, placed postoperatively   CKD (chronic kidney disease)  DVT prophylaxis: Lovenox     Code Status: Full Code  Consults called: None  Admission status: The appropriate patient status for this patient is INPATIENT. Inpatient status is judged to be reasonable and necessary in order to  provide the required intensity of service to ensure the patient's safety. The patient's presenting symptoms, physical exam findings, and initial radiographic and laboratory data in the context of their chronic comorbidities is felt to place them at high risk for further clinical deterioration. Furthermore, it is not anticipated that the patient will be medically stable for discharge from the hospital within 2 midnights of admission.    I certify that at the point of admission it is my clinical judgment that the patient will require inpatient hospital care spanning beyond 2 midnights from the point of admission due to high intensity of service, high risk for further deterioration and high frequency of surveillance required   Time spent: 46 minutes  Evelise Reine Neva Seat MD [facility] Pager [phone]  If 7PM-7AM, please contact night-coverage [email or web address] Password [email or web address]  04/17/2022, 1:40 PM

## 2022-04-17 NOTE — Discharge Instructions (Signed)

## 2022-04-17 NOTE — Consult Note (Signed)
Reason for Consult: left hip fracture Referring Physician: Francia Greaves, MD   Jamie Macdonald is an 77 y.o. female.  HPI: Jamie Macdonald is a 77 y.o. female with a past medical history significant for diabetes, hypertension, and CKD who presents to the ED due to left femur fracture.  Patient fell 3 weeks ago and was evaluated by PCP and advised to start physical therapy.  Patient's physical therapist noticed that her legs were different lengths and a x-ray was ordered.  X-ray performed on 4/1 demonstrates a left femur fracture.  Daughter at bedside states that patient has been using a cane to ambulate and just got a wheelchair from a friend. Ambulated without assistance prior to fall. Lives alone. Patient admits to severe pain in her left hip, worse with movement.  Patient is unsure how she fell.  Daughter notes that patient was walking to church and fell.  No head injury or loss of consciousness.  Not on any blood thinners. No further falls.   Past Medical History:  Diagnosis Date   Anemia    Cancer    ENDOMETRIAL CANCER   Chronic kidney disease    ckd STAGE 3LOV DR [facility] 12-12-17   Diabetes mellitus    Diabetes mellitus type 2 in nonobese 01/16/2018   Essential hypertension 01/16/2018   Exposure to hepatitis C    AS CHILD   Exposure to TB 1973   AS CHILD   Fatty tumor    LEFT SHOULDER REMOVED   Fibroids    History of radiation therapy 05/07/2018-06/04/2018   vaginal brachytherapy   Dr Sondra Come   Hypertension    SLIGHT   Uterine prolapse 01/16/2018   UTI (urinary tract infection)    STARTED AMOXICILLIAN FRIDAY 03-13-2018    Past Surgical History:  Procedure Laterality Date   CERVICAL CONING  74 OR 75   DILATATION & CURETTAGE/HYSTEROSCOPY WITH MYOSURE N/A 01/30/2018   Procedure: DILATATION & CURETTAGE/HYSTEROSCOPY WITH MYOSURE;  Surgeon: Janyth Pupa, DO;  Location: [facility];  Service: Gynecology;  Laterality: N/A;   ROBOTIC ASSISTED LAPAROSCOPIC SACROCOLPOPEXY N/A 03/19/2018    Procedure: XI ROBOTIC ASSISTED LAPAROSCOPIC SACROCOLPOPEXY;  Surgeon: Ardis Hughs, MD;  Location[facility];  Service: Urology;  Laterality: N/A;   ROBOTIC ASSISTED TOTAL HYSTERECTOMY WITH BILATERAL SALPINGO OOPHERECTOMY Bilateral 03/19/2018   Procedure: XI ROBOTIC ASSISTED TOTAL HYSTERECTOMY WITH BILATERAL SALPINGO OOPHORECTOMY;  Surgeon: Everitt Amber, MD;  Location[facility];  Service: Gynecology;  Laterality: Bilateral;   SENTINEL NODE BIOPSY Bilateral 03/19/2018   Procedure: SENTINEL NODE BIOPSY;  Surgeon: Everitt Amber, MD;  [facility];  Service: Gynecology;  Laterality: Bilateral;    Family History  Problem Relation Age of Onset   Stroke Mother    Parkinson's disease Mother    Heart disease Father        HEART ATTACK   Diabetes Father    Prostate cancer Father    Diabetes Sister    Cancer Sister     Social History:  reports that she has never smoked. She has never used smokeless tobacco. She reports that she does not drink alcohol and does not use drugs.  Allergies:  Allergies  Allergen Reactions   Nsaids Other (See Comments)    Due to chronic kidney disease pt does not take any celebrex, motrin, aleve etc.   Aspirin     Other reaction(s): no supposed to take   Estradiol     Other reaction(s): severe allergic reaction   Statins  Other reaction(s): her nephrologist does not want her on statins unless her cholesterol is elevated.    Medications: I have reviewed the patient's current medications. Scheduled:  insulin aspart  0-15 Units Subcutaneous TID WC   insulin aspart  0-5 Units Subcutaneous QHS    Results for orders placed or performed during the hospital encounter of 04/17/22 (from the past 24 hour(s))  Basic metabolic panel     Status: Abnormal   Collection Time: 04/17/22 11:56 AM  Result Value Ref Range   Sodium 137 135 - 145 mmol/L   Potassium 3.9 3.5 - 5.1 mmol/L   Chloride 102 98 - 111 mmol/L   CO2 26 22 - 32 mmol/L   Glucose, Bld 219 (H) 70 - 99  mg/dL   BUN 33 (H) 8 - 23 mg/dL   Creatinine, Ser 1.12 (H) 0.44 - 1.00 mg/dL   Calcium 10.1 8.9 - 10.3 mg/dL   GFR, Estimated 51 (L) >60 mL/min   Anion gap 9 5 - 15  CBC with Differential     Status: Abnormal   Collection Time: 04/17/22 11:56 AM  Result Value Ref Range   WBC 9.2 4.0 - 10.5 K/uL   RBC 4.44 3.87 - 5.11 MIL/uL   Hemoglobin 13.3 12.0 - 15.0 g/dL   HCT 39.9 36.0 - 46.0 %   MCV 89.9 80.0 - 100.0 fL   MCH 30.0 26.0 - 34.0 pg   MCHC 33.3 30.0 - 36.0 g/dL   RDW 13.2 11.5 - 15.5 %   Platelets 212 150 - 400 K/uL   nRBC 0.0 0.0 - 0.2 %   Neutrophils Relative % 87 %   Neutro Abs 8.0 (H) 1.7 - 7.7 K/uL   Lymphocytes Relative 8 %   Lymphs Abs 0.7 0.7 - 4.0 K/uL   Monocytes Relative 5 %   Monocytes Absolute 0.5 0.1 - 1.0 K/uL   Eosinophils Relative 0 %   Eosinophils Absolute 0.0 0.0 - 0.5 K/uL   Basophils Relative 0 %   Basophils Absolute 0.0 0.0 - 0.1 K/uL   Immature Granulocytes 0 %   Abs Immature Granulocytes 0.02 0.00 - 0.07 K/uL  Protime-INR     Status: None   Collection Time: 04/17/22 11:56 AM  Result Value Ref Range   Prothrombin Time 13.7 11.4 - 15.2 seconds   INR 1.1 0.8 - 1.2  Type and screen [place]     Status: None   Collection Time: 04/17/22 11:56 AM  Result Value Ref Range   ABO/RH(D) O POS    Antibody Screen NEG    Sample Expiration      04/20/2022,2359 Performed at [facility], [place] [address], [place], [place] [address]      X-ray: CLINICAL DATA:  Fell 3 weeks ago, pain   EXAM: DG HIP (WITH OR WITHOUT PELVIS) 2-3V LEFT   COMPARISON:  None Available.   FINDINGS: Osseous demineralization.   Displaced subcapital fracture LEFT femur.   No dislocation.   Degenerative disc and facet disease changes lower lumbar spine.   Numerous radiopacities within stool.   IMPRESSION: Displaced subcapital fracture LEFT femur.   Ingested radiopacities of uncertain etiology within stool.     Electronically  Signed   By: Lavonia Dana M.D.  ROS: As noted per HPI  Blood pressure 126/73, pulse 90, temperature 98.3 F (36.8 C), temperature source Oral, resp. rate (!) 24, height 4\' 10"  (1.473 m), weight 49.9 kg, SpO2 99 %.  Physical Exam: Vitals and  nursing note reviewed.  Constitutional:      General: She is not in acute distress.    Appearance: She is not ill-appearing.  HENT:     Head: Normocephalic.  Eyes:     Pupils: Pupils are equal, round, and reactive to light.  Cardiovascular:     Rate and Rhythm: Normal rate and regular rhythm.     Pulses: Normal pulses.     Heart sounds: Normal heart sounds. No murmur heard.    No friction rub. No gallop.  Pulmonary:     Effort: Pulmonary effort is normal.     Breath sounds: Normal breath sounds.  Abdominal:     General: Abdomen is flat. There is no distension.     Palpations: Abdomen is soft.     Tenderness: There is no abdominal tenderness. There is no guarding or rebound.  Musculoskeletal:        General: Normal range of motion.     Cervical back: Neck supple.     Comments: Decreased ROM of left hip. Shortened LLE. Some bony tenderness to left hip. Pedal pulses palpable. Soft compartments.   Skin:    General: Skin is warm and dry.  Neurological:     General: No focal deficit present.     Mental Status: She is alert.  Psychiatric:        Mood and Affect: Mood normal.        Behavior: Behavior normal.   Assessment/Plan: Displaced left hip femoral neck fracture  Plan: Will schedule her for a left total hip replacement this afternoon NPO Orders placed  Will plan to review with patient and/or family details prior to surgery Likely will be WBAT post op Disposition pending her progress with PT  Mauri Pole 04/17/2022, 1:48 PM

## 2022-04-17 NOTE — Anesthesia Procedure Notes (Signed)
Spinal  Patient location during procedure: OR Start time: 04/17/2022 5:33 PM End time: 04/17/2022 5:38 PM Reason for block: surgical anesthesia Staffing Performed: anesthesiologist  Anesthesiologist: Pervis Hocking, DO Performed by: Pervis Hocking, DO Authorized by: Pervis Hocking, DO   Preanesthetic Checklist Completed: patient identified, IV checked, risks and benefits discussed, surgical consent, monitors and equipment checked, pre-op evaluation and timeout performed Spinal Block Patient position: sitting Prep: DuraPrep and site prepped and draped Patient monitoring: cardiac monitor, continuous pulse ox and blood pressure Approach: midline Location: L3-4 Injection technique: single-shot Needle Needle type: Pencan  Needle gauge: 24 G Needle length: 9 cm Assessment Sensory level: T6 Events: CSF return Additional Notes Functioning IV was confirmed and monitors were applied. Sterile prep and drape, including hand hygiene and sterile gloves were used. The patient was positioned and the spine was prepped. The skin was anesthetized with lidocaine.  Free flow of clear CSF was obtained prior to injecting local anesthetic into the CSF.  The spinal needle aspirated freely following injection.  The needle was carefully withdrawn.  The patient tolerated the procedure well.

## 2022-04-17 NOTE — Op Note (Signed)
NAME:  CIA SHARF                ACCOUNT NO.: [id number]      MEDICAL RECORD NO.: [id number]      FACILITY:  [facility]      PHYSICIAN:  Mauri Pole  DATE OF BIRTH:  February 26, 1945     DATE OF PROCEDURE:  04/17/2022                                 OPERATIVE REPORT         PREOPERATIVE DIAGNOSIS: Left  hip displaced femoral neck fracture      POSTOPERATIVE DIAGNOSIS:  Left hip displaced femoral neck fracture     PROCEDURE:  Left total hip replacement through an anterior approach   utilizing DePuy THR system, component size 50 mm pinnacle cup, a size 32+4 neutral   Altrex liner, a size 5 Hi Actis stem with a 32+1 Articuleze metal head ball.      SURGEON:  Pietro Cassis. Alvan Dame, M.D.      ASSISTANT:  Costella Hatcher, PA-C     ANESTHESIA:  Spinal.      SPECIMENS:  None.      COMPLICATIONS:  None.      BLOOD LOSS:  300 cc     DRAINS:  None.      INDICATION OF THE PROCEDURE:  Jamie Macdonald is a 77 y.o. female who presented to the ER upon our request after seeing that she had sustained a left femoral neck fracture.  We reviewed the indications for surgery.  I recommend that she undergo a left total hip replacement for best short and long term management of the fracture. Consent was obtained for   benefit of pain relief.  Specific risks of infection, DVT, component   failure, dislocation, neurovascular injury, and need for revision surgery were reviewed in the office.     PROCEDURE IN DETAIL:  The patient was brought to operative theater.   Once adequate anesthesia, preoperative antibiotics, 2 gm of Ancef, 1 gm of Tranexamic Acid, and 10 mg of Decadron were administered, the patient was positioned supine on the [employer] table.  Once the patient was safely positioned with adequate padding of boney prominences we predraped out the hip, and used fluoroscopy to confirm orientation of the pelvis.      The left hip was then prepped and draped from proximal iliac crest to   mid  thigh with a shower curtain technique.      Time-out was performed identifying the patient, planned procedure, and the appropriate extremity.     An incision was then made 2 cm lateral to the   anterior superior iliac spine extending over the orientation of the   tensor fascia lata muscle and sharp dissection was carried down to the   fascia of the muscle.      The fascia was then incised.  The muscle belly was identified and swept   laterally and retractor placed along the superior neck.  Following   cauterization of the circumflex vessels and removing some pericapsular   fat, a second cobra retractor was placed on the inferior neck.  A T-capsulotomy was made along the line of the   superior neck to the trochanteric fossa, then extended proximally and   distally.  Tag sutures were placed and the retractors were then placed   intracapsular.  We then identified the trochanteric fossa and   orientation of my neck cut and then made a neck osteotomy with the femur on traction.  The fractured femoral neck segment and the femoral head was removed without difficulty or complication.  Traction was let   off and retractors were placed posterior and anterior around the   acetabulum.      The labrum and foveal tissue were debrided.  I began reaming with a 45 mm   reamer and reamed up to 49 mm reamer with good bony bed preparation and a 59 mm  cup was chosen.  The final 50 mm Pinnacle cup was then impacted under fluoroscopy to confirm the depth of penetration and orientation with respect to   Abduction and forward flexion.  A screw was placed into the ilium followed by the hole eliminator.  The final   32+4 neutral Altrex liner was impacted with good visualized rim fit.  The cup was positioned anatomically within the acetabular portion of the pelvis.      At this point, the femur was rolled to 100 degrees.  Further capsule was   released off the inferior aspect of the femoral neck.  I then   released  the superior capsule proximally.  With the leg in a neutral position the hook was placed laterally   along the femur under the vastus lateralis origin and elevated manually and then held in position using the hook attachment on the bed.  The leg was then extended and adducted with the leg rolled to 100   degrees of external rotation.  Retractors were placed along the medial calcar and posteriorly over the greater trochanter.  Once the proximal femur was fully   exposed, I used a box osteotome to set orientation.  I then began   broaching with the starting chili pepper broach and passed this by hand and then broached up to 5.  With the 5 broach in place I chose a high offset neck and did several trial reductions.  The offset was appropriate, leg lengths   appeared to be equal best matched with the +1 head ball trial confirmed radiographically.   Given these findings, I went ahead and dislocated the hip, repositioned all   retractors and positioned the right hip in the extended and abducted position.  The final 5 Hi Actis stem was   chosen and it was impacted down to the level of neck cut.  Based on this   and the trial reductions, a final 32+1 Articuleze metal head ball was chosen and   impacted onto a clean and dry trunnion, and the hip was reduced.  The   hip had been irrigated throughout the case again at this point.  I did   reapproximate the superior capsular leaflet to the anterior leaflet   using #1 Vicryl.  The fascia of the   tensor fascia lata muscle was then reapproximated using #1 Vicryl and #0 Stratafix sutures.  The   remaining wound was closed with 2-0 Vicryl and running 4-0 Monocryl.   The hip was cleaned, dried, and dressed sterilely using Dermabond and   Aquacel dressing.  The patient was then brought   to recovery room in stable condition tolerating the procedure well.    Costella Hatcher, PA-C was present for the entirety of the case involved from   preoperative positioning,  perioperative retractor management, general   facilitation of the case, as well as primary wound closure as assistant.  Pietro Cassis Alvan Dame, M.D.        04/17/2022 5:23 PM

## 2022-04-17 NOTE — ED Notes (Signed)
ED TO INPATIENT HANDOFF REPORT  ED Nurse Name and Phone #: [phone]   S Name/Age/Gender Jamie Macdonald 77 y.o. female Room/Bed: WA09/WA09  Code Status   Code Status: Prior  Home/SNF/Other Home Patient oriented to: self, place, time, and situation Is this baseline? Yes   Triage Complete: Triage complete  Chief Complaint dr sent her, ct scan results  Triage Note Pt presents with c/o left leg pain after a fall that occurred several weeks ago. Pt went to the physical therapist upon recommendation from her MD and was referred for imaging. Pt has a left femur fracture per her results.   Allergies Allergies  Allergen Reactions   Nsaids Other (See Comments)    Due to chronic kidney disease pt does not take any celebrex, motrin, aleve etc.   Aspirin     Other reaction(s): no supposed to take   Estradiol     Other reaction(s): severe allergic reaction   Statins     Other reaction(s): her nephrologist does not want her on statins unless her cholesterol is elevated.    Level of Care/Admitting Diagnosis ED Disposition     ED Disposition  Admit   Condition  --   Comment  The patient appears reasonably stabilized for admission considering the current resources, flow, and capabilities available in the ED at this time, and I doubt any other [facility] requiring further screening and/or treatment in the ED prior to admission is  present.          B Medical/Surgery History Past Medical History:  Diagnosis Date   Anemia    Cancer    ENDOMETRIAL CANCER   Chronic kidney disease    ckd STAGE 3LOV DR [facility] 12-12-17   Diabetes mellitus    Diabetes mellitus type 2 in nonobese 01/16/2018   Essential hypertension 01/16/2018   Exposure to hepatitis C    AS CHILD   Exposure to TB 1973   AS CHILD   Fatty tumor    LEFT SHOULDER REMOVED   Fibroids    History of radiation therapy 05/07/2018-06/04/2018   vaginal brachytherapy   Dr Sondra Come   Hypertension    SLIGHT   Uterine  prolapse 01/16/2018   UTI (urinary tract infection)    STARTED AMOXICILLIAN FRIDAY 03-13-2018   Past Surgical History:  Procedure Laterality Date   CERVICAL CONING  74 OR 75   DILATATION & CURETTAGE/HYSTEROSCOPY WITH MYOSURE N/A 01/30/2018   Procedure: DILATATION & CURETTAGE/HYSTEROSCOPY WITH MYOSURE;  Surgeon: Janyth Pupa, DO;  Location: [facility];  Service: Gynecology;  Laterality: N/A;   ROBOTIC ASSISTED LAPAROSCOPIC SACROCOLPOPEXY N/A 03/19/2018   Procedure: XI ROBOTIC ASSISTED LAPAROSCOPIC SACROCOLPOPEXY;  Surgeon: Ardis Hughs, MD;  [facility]: [facility];  Service: Urology;  Laterality: N/A;   ROBOTIC ASSISTED TOTAL HYSTERECTOMY WITH BILATERAL SALPINGO OOPHERECTOMY Bilateral 03/19/2018   Procedure: XI ROBOTIC ASSISTED TOTAL HYSTERECTOMY WITH BILATERAL SALPINGO OOPHORECTOMY;  Surgeon: Everitt Amber, MD;  [facility];  Service: Gynecology;  Laterality: Bilateral;   SENTINEL NODE BIOPSY Bilateral 03/19/2018   Procedure: SENTINEL NODE BIOPSY;  Surgeon: Everitt Amber, MD;  Location[facility];  Service: Gynecology;  Laterality: Bilateral;     A IV Location/Drains/Wounds Patient Lines/Drains/Airways Status     Active Line/Drains/Airways     Name Placement date Placement time Site Days   Peripheral IV 04/17/22 20 G Right;Lateral Forearm 04/17/22  1204  Forearm  less than 1   Peripheral IV (Ped) --  --  -- --   Incision -  5 Ports Abdomen 1: Right;Lateral 2: Umbilicus 3: Left;Lateral;Upper 4: Left;Lateral;Mid 5: Left;Lateral;Lower 03/19/18  1148  -- 1490            Intake/Output Last 24 hours No intake or output data in the 24 hours ending 04/17/22 1306  Labs/Imaging Results for orders placed or performed during the hospital encounter of 04/17/22 (from the past 48 hour(s))  Basic metabolic panel     Status: Abnormal   Collection Time: 04/17/22 11:56 AM  Result Value Ref Range   Sodium 137 135 - 145 mmol/L   Potassium 3.9 3.5 - 5.1 mmol/L   Chloride 102 98 - 111  mmol/L   CO2 26 22 - 32 mmol/L   Glucose, Bld 219 (H) 70 - 99 mg/dL    Comment: Glucose reference range applies only to samples taken after fasting for at least 8 hours.   BUN 33 (H) 8 - 23 mg/dL   Creatinine, Ser 1.12 (H) 0.44 - 1.00 mg/dL   Calcium 10.1 8.9 - 10.3 mg/dL   GFR, Estimated 51 (L) >60 mL/min    Comment: (NOTE) Calculated using the CKD-EPI Creatinine Equation (2021)    Anion gap 9 5 - 15    Comment: Performed at [facility], [place] [address], [place], [place] [address]  CBC with Differential     Status: Abnormal   Collection Time: 04/17/22 11:56 AM  Result Value Ref Range   WBC 9.2 4.0 - 10.5 K/uL   RBC 4.44 3.87 - 5.11 MIL/uL   Hemoglobin 13.3 12.0 - 15.0 g/dL   HCT 39.9 36.0 - 46.0 %   MCV 89.9 80.0 - 100.0 fL   MCH 30.0 26.0 - 34.0 pg   MCHC 33.3 30.0 - 36.0 g/dL   RDW 13.2 11.5 - 15.5 %   Platelets 212 150 - 400 K/uL   nRBC 0.0 0.0 - 0.2 %   Neutrophils Relative % 87 %   Neutro Abs 8.0 (H) 1.7 - 7.7 K/uL   Lymphocytes Relative 8 %   Lymphs Abs 0.7 0.7 - 4.0 K/uL   Monocytes Relative 5 %   Monocytes Absolute 0.5 0.1 - 1.0 K/uL   Eosinophils Relative 0 %   Eosinophils Absolute 0.0 0.0 - 0.5 K/uL   Basophils Relative 0 %   Basophils Absolute 0.0 0.0 - 0.1 K/uL   Immature Granulocytes 0 %   Abs Immature Granulocytes 0.02 0.00 - 0.07 K/uL    Comment: Performed at [facility], [place] [address] [place], [place] [address]  Protime-INR     Status: None   Collection Time: 04/17/22 11:56 AM  Result Value Ref Range   Prothrombin Time 13.7 11.4 - 15.2 seconds   INR 1.1 0.8 - 1.2    Comment: (NOTE) INR goal varies based on device and disease states. Performed at [facility], [place] [address] [place], [place] [address]   Type and screen [facility]     Status: None (Preliminary result)   Collection Time: 04/17/22 11:56 AM  Result Value Ref Range   ABO/RH(D) PENDING    Antibody Screen  PENDING    Sample Expiration      04/20/2022,2359 Performed at [facility], [place] [address], [place], [place] [address]    No results found.  Pending Labs [employer] (From admission, onward)     Start     Ordered   Signed and Held  Type and screen Order type and screen if day  of surgery is less than 15 days from draw of preadmission visit or order morning of surgery if day of surgery is greater than 6 days from preadmission visit.  Once,   R       Comments: Order type and screen if day of surgery is less than 15 days from draw of preadmission visit or order morning of surgery if day of surgery is greater than 6 days from preadmission visit.    Signed and Held            Vitals/Pain Today's Vitals   04/17/22 1054 04/17/22 1200 04/17/22 1204 04/17/22 1300  BP:  (!) 146/81  (!) 149/81  Pulse:  97  99  Resp:  (!) 21  17  Temp:      TempSrc:      SpO2:  100%  100%  Weight:   110 lb (49.9 kg)   Height:   4\' 10"  (1.473 m)   PainSc: 2        Isolation Precautions No active isolations  Medications Medications - No data to display  Mobility Typically walks but not much since fx occurred the other day     Focused Assessments Left hip fx with shortening and rotation externally + pedal pulse and sensation   R Recommendations: See Admitting Provider Note  Report given to:   Additional Notes: [id number]

## 2022-04-17 NOTE — Anesthesia Postprocedure Evaluation (Signed)
Anesthesia Post Note  Patient: Jamie Macdonald  Procedure(s) Performed: TOTAL HIP ARTHROPLASTY ANTERIOR APPROCH (Left: Hip)     Patient location during evaluation: PACU Anesthesia Type: MAC and Spinal Level of consciousness: awake and alert and oriented Pain management: pain level controlled Vital Signs Assessment: post-procedure vital signs reviewed and stable Respiratory status: spontaneous breathing, nonlabored ventilation and respiratory function stable Cardiovascular status: blood pressure returned to baseline and stable Postop Assessment: no headache, no backache and spinal receding Anesthetic complications: no   No notable events documented.  Last Vitals:  Vitals:   04/17/22 1921 04/17/22 1925  BP: (!) 86/60 97/60  Pulse: 90 89  Resp: 16 11  Temp:    SpO2: 100% 100%    Last Pain:  Vitals:   04/17/22 1600  TempSrc:   PainSc: 0-No pain                 Pervis Hocking

## 2022-04-17 NOTE — ED Triage Notes (Signed)
Pt presents with c/o left leg pain after a fall that occurred several weeks ago. Pt went to the physical therapist upon recommendation from her MD and was referred for imaging. Pt has a left femur fracture per her results.

## 2022-04-17 NOTE — H&P (View-Only) (Signed)
Reason for Consult: left hip fracture Referring Physician: Francia Greaves, MD   Jamie Macdonald is an 77 y.o. female.  HPI: Jamie Macdonald is a 77 y.o. female with a past medical history significant for diabetes, hypertension, and CKD who presents to the ED due to left femur fracture.  Patient fell 3 weeks ago and was evaluated by PCP and advised to start physical therapy.  Patient's physical therapist noticed that her legs were different lengths and a x-ray was ordered.  X-ray performed on 4/1 demonstrates a left femur fracture.  Daughter at bedside states that patient has been using a cane to ambulate and just got a wheelchair from a friend. Ambulated without assistance prior to fall. Lives alone. Patient admits to severe pain in her left hip, worse with movement.  Patient is unsure how she fell.  Daughter notes that patient was walking to church and fell.  No head injury or loss of consciousness.  Not on any blood thinners. No further falls.   Past Medical History:  Diagnosis Date   Anemia    Cancer    ENDOMETRIAL CANCER   Chronic kidney disease    ckd STAGE 3LOV DR [facility] 12-12-17   Diabetes mellitus    Diabetes mellitus type 2 in nonobese 01/16/2018   Essential hypertension 01/16/2018   Exposure to hepatitis C    AS CHILD   Exposure to TB 1973   AS CHILD   Fatty tumor    LEFT SHOULDER REMOVED   Fibroids    History of radiation therapy 05/07/2018-06/04/2018   vaginal brachytherapy   Dr Sondra Come   Hypertension    SLIGHT   Uterine prolapse 01/16/2018   UTI (urinary tract infection)    STARTED AMOXICILLIAN FRIDAY 03-13-2018    Past Surgical History:  Procedure Laterality Date   CERVICAL CONING  74 OR 75   DILATATION & CURETTAGE/HYSTEROSCOPY WITH MYOSURE N/A 01/30/2018   Procedure: DILATATION & CURETTAGE/HYSTEROSCOPY WITH MYOSURE;  Surgeon: Janyth Pupa, DO;  Location: [facility];  Service: Gynecology;  Laterality: N/A;   ROBOTIC ASSISTED LAPAROSCOPIC SACROCOLPOPEXY N/A 03/19/2018    Procedure: XI ROBOTIC ASSISTED LAPAROSCOPIC SACROCOLPOPEXY;  Surgeon: Ardis Hughs, MD;  [facility];  Service: Urology;  Laterality: N/A;   ROBOTIC ASSISTED TOTAL HYSTERECTOMY WITH BILATERAL SALPINGO OOPHERECTOMY Bilateral 03/19/2018   Procedure: XI ROBOTIC ASSISTED TOTAL HYSTERECTOMY WITH BILATERAL SALPINGO OOPHORECTOMY;  Surgeon: Everitt Amber, MD;  [facility];  Service: Gynecology;  Laterality: Bilateral;   SENTINEL NODE BIOPSY Bilateral 03/19/2018   Procedure: SENTINEL NODE BIOPSY;  Surgeon: Everitt Amber, MD;  [facility];  Service: Gynecology;  Laterality: Bilateral;    Family History  Problem Relation Age of Onset   Stroke Mother    Parkinson's disease Mother    Heart disease Father        HEART ATTACK   Diabetes Father    Prostate cancer Father    Diabetes Sister    Cancer Sister     Social History:  reports that she has never smoked. She has never used smokeless tobacco. She reports that she does not drink alcohol and does not use drugs.  Allergies:  Allergies  Allergen Reactions   Nsaids Other (See Comments)    Due to chronic kidney disease pt does not take any celebrex, motrin, aleve etc.   Aspirin     Other reaction(s): no supposed to take   Estradiol     Other reaction(s): severe allergic reaction   Statins  Other reaction(s): her nephrologist does not want her on statins unless her cholesterol is elevated.    Medications: I have reviewed the patient's current medications. Scheduled:  insulin aspart  0-15 Units Subcutaneous TID WC   insulin aspart  0-5 Units Subcutaneous QHS    Results for orders placed or performed during the hospital encounter of 04/17/22 (from the past 24 hour(s))  Basic metabolic panel     Status: Abnormal   Collection Time: 04/17/22 11:56 AM  Result Value Ref Range   Sodium 137 135 - 145 mmol/L   Potassium 3.9 3.5 - 5.1 mmol/L   Chloride 102 98 - 111 mmol/L   CO2 26 22 - 32 mmol/L   Glucose, Bld 219 (H) 70 - 99  mg/dL   BUN 33 (H) 8 - 23 mg/dL   Creatinine, Ser 1.12 (H) 0.44 - 1.00 mg/dL   Calcium 10.1 8.9 - 10.3 mg/dL   GFR, Estimated 51 (L) >60 mL/min   Anion gap 9 5 - 15  CBC with Differential     Status: Abnormal   Collection Time: 04/17/22 11:56 AM  Result Value Ref Range   WBC 9.2 4.0 - 10.5 K/uL   RBC 4.44 3.87 - 5.11 MIL/uL   Hemoglobin 13.3 12.0 - 15.0 g/dL   HCT 39.9 36.0 - 46.0 %   MCV 89.9 80.0 - 100.0 fL   MCH 30.0 26.0 - 34.0 pg   MCHC 33.3 30.0 - 36.0 g/dL   RDW 13.2 11.5 - 15.5 %   Platelets 212 150 - 400 K/uL   nRBC 0.0 0.0 - 0.2 %   Neutrophils Relative % 87 %   Neutro Abs 8.0 (H) 1.7 - 7.7 K/uL   Lymphocytes Relative 8 %   Lymphs Abs 0.7 0.7 - 4.0 K/uL   Monocytes Relative 5 %   Monocytes Absolute 0.5 0.1 - 1.0 K/uL   Eosinophils Relative 0 %   Eosinophils Absolute 0.0 0.0 - 0.5 K/uL   Basophils Relative 0 %   Basophils Absolute 0.0 0.0 - 0.1 K/uL   Immature Granulocytes 0 %   Abs Immature Granulocytes 0.02 0.00 - 0.07 K/uL  Protime-INR     Status: None   Collection Time: 04/17/22 11:56 AM  Result Value Ref Range   Prothrombin Time 13.7 11.4 - 15.2 seconds   INR 1.1 0.8 - 1.2  Type and screen [facility]     Status: None   Collection Time: 04/17/22 11:56 AM  Result Value Ref Range   ABO/RH(D) O POS    Antibody Screen NEG    Sample Expiration      04/20/2022,2359 Performed at [facility], [place] [address] [place], [place] [address]      X-ray: CLINICAL DATA:  Fell 3 weeks ago, pain   EXAM: DG HIP (WITH OR WITHOUT PELVIS) 2-3V LEFT   COMPARISON:  None Available.   FINDINGS: Osseous demineralization.   Displaced subcapital fracture LEFT femur.   No dislocation.   Degenerative disc and facet disease changes lower lumbar spine.   Numerous radiopacities within stool.   IMPRESSION: Displaced subcapital fracture LEFT femur.   Ingested radiopacities of uncertain etiology within stool.     Electronically  Signed   By: Lavonia Dana M.D.  ROS: As noted per HPI  Blood pressure 126/73, pulse 90, temperature 98.3 F (36.8 C), temperature source Oral, resp. rate (!) 24, height 4\' 10"  (1.473 m), weight 49.9 kg, SpO2 99 %.  Physical Exam: Vitals and  nursing note reviewed.  Constitutional:      General: She is not in acute distress.    Appearance: She is not ill-appearing.  HENT:     Head: Normocephalic.  Eyes:     Pupils: Pupils are equal, round, and reactive to light.  Cardiovascular:     Rate and Rhythm: Normal rate and regular rhythm.     Pulses: Normal pulses.     Heart sounds: Normal heart sounds. No murmur heard.    No friction rub. No gallop.  Pulmonary:     Effort: Pulmonary effort is normal.     Breath sounds: Normal breath sounds.  Abdominal:     General: Abdomen is flat. There is no distension.     Palpations: Abdomen is soft.     Tenderness: There is no abdominal tenderness. There is no guarding or rebound.  Musculoskeletal:        General: Normal range of motion.     Cervical back: Neck supple.     Comments: Decreased ROM of left hip. Shortened LLE. Some bony tenderness to left hip. Pedal pulses palpable. Soft compartments.   Skin:    General: Skin is warm and dry.  Neurological:     General: No focal deficit present.     Mental Status: She is alert.  Psychiatric:        Mood and Affect: Mood normal.        Behavior: Behavior normal.   Assessment/Plan: Displaced left hip femoral neck fracture  Plan: Will schedule her for a left total hip replacement this afternoon NPO Orders placed  Will plan to review with patient and/or family details prior to surgery Likely will be WBAT post op Disposition pending her progress with PT  Mauri Pole 04/17/2022, 1:48 PM

## 2022-04-17 NOTE — Anesthesia Preprocedure Evaluation (Addendum)
Anesthesia Evaluation  Patient identified by MRN, date of birth, ID band Patient awake    Reviewed: Allergy & Precautions, H&P , NPO status , Patient's Chart, lab work & pertinent test results  Airway Mallampati: II  TM Distance: >3 FB Neck ROM: Full    Dental  (+) Teeth Intact, Dental Advisory Given   Pulmonary neg pulmonary ROS   Pulmonary exam normal breath sounds clear to auscultation       Cardiovascular hypertension (145/79 preop), Pt. on medications Normal cardiovascular exam Rhythm:Regular Rate:Normal     Neuro/Psych negative neurological ROS  negative psych ROS   GI/Hepatic negative GI ROS, Neg liver ROS,,,  Endo/Other  diabetes, Well Controlled, Type 2  FS 157 preop, no home meds  Renal/GU Renal diseaseCKD 3, Cr 1.12  negative genitourinary   Musculoskeletal negative musculoskeletal ROS (+)    Abdominal   Peds negative pediatric ROS (+)  Hematology negative hematology ROS (+) Hb 13.3, plt 212   Anesthesia Other Findings   Reproductive/Obstetrics negative OB ROS                              Anesthesia Physical Anesthesia Plan  ASA: 2  Anesthesia Plan: Spinal and MAC   Post-op Pain Management: Ofirmev IV (intra-op)*   Induction:   PONV Risk Score and Plan: 2 and Propofol infusion and TIVA  Airway Management Planned: Natural Airway and Nasal Cannula  Additional Equipment: None  Intra-op Plan:   Post-operative Plan:   Informed Consent: I have reviewed the patients History and Physical, chart, labs and discussed the procedure including the risks, benefits and alternatives for the proposed anesthesia with the patient or authorized representative who has indicated his/her understanding and acceptance.       Plan Discussed with: CRNA  Anesthesia Plan Comments:          Anesthesia Quick Evaluation

## 2022-04-18 ENCOUNTER — Encounter (HOSPITAL_COMMUNITY): Payer: Self-pay | Admitting: Orthopedic Surgery

## 2022-04-18 DIAGNOSIS — W19XXXA Unspecified fall, initial encounter: Secondary | ICD-10-CM

## 2022-04-18 DIAGNOSIS — S728X2A Other fracture of left femur, initial encounter for closed fracture: Secondary | ICD-10-CM | POA: Diagnosis not present

## 2022-04-18 LAB — HEMOGLOBIN A1C
Hgb A1c MFr Bld: 6.7 % — ABNORMAL HIGH (ref 4.8–5.6)
Mean Plasma Glucose: 146 mg/dL

## 2022-04-18 LAB — BASIC METABOLIC PANEL
Anion gap: 6 (ref 5–15)
BUN: 29 mg/dL — ABNORMAL HIGH (ref 8–23)
CO2: 25 mmol/L (ref 22–32)
Calcium: 8.6 mg/dL — ABNORMAL LOW (ref 8.9–10.3)
Chloride: 105 mmol/L (ref 98–111)
Creatinine, Ser: 1.01 mg/dL — ABNORMAL HIGH (ref 0.44–1.00)
GFR, Estimated: 58 mL/min — ABNORMAL LOW (ref >60)
Glucose, Bld: 132 mg/dL — ABNORMAL HIGH (ref 70–99)
Potassium: 3.8 mmol/L (ref 3.5–5.1)
Sodium: 136 mmol/L (ref 135–145)

## 2022-04-18 LAB — CBC
HCT: 29.2 % — ABNORMAL LOW (ref 36.0–46.0)
Hemoglobin: 9.6 g/dL — ABNORMAL LOW (ref 12.0–15.0)
MCH: 29.8 pg (ref 26.0–34.0)
MCHC: 32.9 g/dL (ref 30.0–36.0)
MCV: 90.7 fL (ref 80.0–100.0)
Platelets: 117 10*3/uL — ABNORMAL LOW (ref 150–400)
RBC: 3.22 MIL/uL — ABNORMAL LOW (ref 3.87–5.11)
RDW: 13.1 % (ref 11.5–15.5)
WBC: 7.1 10*3/uL (ref 4.0–10.5)
nRBC: 0 % (ref 0.0–0.2)

## 2022-04-18 LAB — GLUCOSE, CAPILLARY
Glucose-Capillary: 149 mg/dL — ABNORMAL HIGH (ref 70–99)
Glucose-Capillary: 280 mg/dL — ABNORMAL HIGH (ref 70–99)
Glucose-Capillary: 260 mg/dL — ABNORMAL HIGH (ref 70–99)
Glucose-Capillary: 176 mg/dL — ABNORMAL HIGH (ref 70–99)

## 2022-04-18 MED ORDER — INSULIN ASPART 100 UNIT/ML IJ SOLN
3.0000 [IU] | Freq: Three times a day (TID) | INTRAMUSCULAR | Status: DC
  Administered 2022-04-18 – 2022-04-20 (×6): 3 [IU] via SUBCUTANEOUS

## 2022-04-18 MED ORDER — INSULIN ASPART 100 UNIT/ML IJ SOLN: 0.0000 [IU] | Freq: Every day | INTRAMUSCULAR | Status: DC

## 2022-04-18 MED ORDER — INSULIN ASPART 100 UNIT/ML IJ SOLN
0.0000 [IU] | Freq: Three times a day (TID) | INTRAMUSCULAR | Status: DC
  Administered 2022-04-18 (×2): 5 [IU] via SUBCUTANEOUS
  Administered 2022-04-19: 1 [IU] via SUBCUTANEOUS
  Administered 2022-04-19: 3 [IU] via SUBCUTANEOUS
  Administered 2022-04-19: 1 [IU] via SUBCUTANEOUS

## 2022-04-18 NOTE — Evaluation (Signed)
Physical Therapy Evaluation Patient Details Name: Jamie Macdonald MRN: [id number] DOB: May 29, 1945 Today's Date: 04/18/2022  History of Present Illness  Jamie Macdonald is a 77 y.o. female s/p L THA AA 4/3 for L femur fracture. PMH: CKD, HTN, diabetes  Clinical Impression  Pt is s/p L THA AA resulting in the deficits listed below (see PT Problem List). Pt ind without AD at baseline but had a fall a couple weeks ago with undiagnosed femur fracture limiting pt's ambulation and mobility. Pt currently needing min A with bed mobility, transfers and ambulation. Pt needing increased time and cues managing RW with transfers and ambulation. Pt with good family support, able to provide transportation but unable to provide 24/7 assist. Pt with increased L hip pain with mobility improves with rest. Pt will benefit from acute skilled PT to increase their independence and safety with mobility to facilitate discharge with postacute PT.         Recommendations for follow up therapy are one component of a multi-disciplinary discharge planning process, led by the attending physician.  Recommendations may be updated based on patient status, additional functional criteria and insurance authorization.  Follow Up Recommendations Can patient physically be transported by private vehicle: Yes     Assistance Recommended at Discharge Frequent or constant Supervision/Assistance  Patient can return home with the following  A little help with walking and/or transfers;A little help with bathing/dressing/bathroom;Assistance with cooking/housework;Assist for transportation    Equipment Recommendations Rolling walker (2 wheels) (youth height 4'10")  Recommendations for Other Services       Functional Status Assessment Patient has had a recent decline in their functional status and demonstrates the ability to make significant improvements in function in a reasonable and predictable amount of time.     Precautions / Restrictions  Precautions Precautions: Fall Restrictions Weight Bearing Restrictions: No      Mobility  Bed Mobility Overal bed mobility: Needs Assistance Bed Mobility: Supine to Sit  Supine to sit: Min assist  General bed mobility comments: min A to mobilize LLE, upright trunk and slide out to EOB, increased time    Transfers Overall transfer level: Needs assistance Equipment used: Rolling walker (2 wheels) Transfers: Sit to/from Stand, Bed to chair/wheelchair/BSC Sit to Stand: Min assist, Min guard Stand pivot transfers: Min assist  General transfer comment: min A-min G to power up to stand and pivot to [facility], cues for hand placement, min G for following STS from EOB and recliner    Ambulation/Gait Ambulation/Gait assistance: Min guard, Min assist Gait Distance (Feet): 100 Feet (x2) Assistive device: Rolling walker (2 wheels) Gait Pattern/deviations: Step-to pattern, Decreased stride length, Narrow base of support Gait velocity: decreased  General Gait Details: min A to min G, very narrow BOS, step-to to slight step-through gait pattern, no knee buckling, trunk slightly flexed forward, cues and increased time to maneuver around obstacles and with turns, standing rest break  Stairs            Wheelchair Mobility    Modified Rankin (Stroke Patients Only)       Balance Overall balance assessment: Needs assistance Sitting-balance support: Feet supported, Bilateral upper extremity supported Sitting balance-Leahy Scale: Fair  Standing balance support: Reliant on assistive device for balance, During functional activity, Bilateral upper extremity supported Standing balance-Leahy Scale: Poor       Pertinent Vitals/Pain Pain Assessment Pain Assessment: Faces Faces Pain Scale: Hurts a little bit Pain Location: L hip and bottom Pain Descriptors / Indicators: Sore, Tender Pain Intervention(s):  Limited activity within patient's tolerance, Monitored during session, Premedicated before  session, Repositioned, Ice applied    Home Living Family/patient expects to be discharged to:: Private residence Living Arrangements: Alone Available Help at Discharge: Family;Available PRN/intermittently Type of Home: House (condo) Home Access: Level entry  Home Layout: One level Home Equipment: None      Prior Function Prior Level of Function : Independent/Modified Independent  Mobility Comments: pt reports ind without AD, famly provides transportation ADLs Comments: pt reports ind     Hand Dominance        Extremity/Trunk Assessment   Upper Extremity Assessment Upper Extremity Assessment: Overall WFL for tasks assessed    Lower Extremity Assessment Lower Extremity Assessment: LLE deficits/detail LLE Deficits / Details: ankle WNL, knee extension 3/5, hip abduction 3+/5, hip adduction 3+/5, hip flexion 2-/5 LLE Sensation: WNL LLE Coordination: WNL    Cervical / Trunk Assessment Cervical / Trunk Assessment: Kyphotic  Communication   Communication: No difficulties  Cognition Arousal/Alertness: Awake/alert Behavior During Therapy: WFL for tasks assessed/performed Overall Cognitive Status: Within Functional Limits for tasks assessed         General Comments General comments (skin integrity, edema, etc.): Pt on RA with SpO2 98-100%    Exercises     Assessment/Plan    PT Assessment Patient needs continued PT services  PT Problem List Decreased strength;Decreased range of motion;Decreased activity tolerance;Decreased balance;Decreased mobility;Decreased knowledge of use of DME;Decreased safety awareness;Pain       PT Treatment Interventions DME instruction;Gait training;Functional mobility training;Therapeutic activities;Therapeutic exercise;Balance training;Neuromuscular re-education;Patient/family education;Modalities    PT Goals (Current goals can be found in the Care Plan section)  Acute Rehab PT Goals Patient Stated Goal: regain independence PT Goal  Formulation: With patient/family Time For Goal Achievement: 05/02/22 Potential to Achieve Goals: Good    Frequency Min 3X/week     Co-evaluation               AM-PAC PT "6 Clicks" Mobility  Outcome Measure Help needed turning from your back to your side while in a flat bed without using bedrails?: A Little Help needed moving from lying on your back to sitting on the side of a flat bed without using bedrails?: A Little Help needed moving to and from a bed to a chair (including a wheelchair)?: A Little Help needed standing up from a chair using your arms (e.g., wheelchair or bedside chair)?: A Little Help needed to walk in hospital room?: A Little Help needed climbing 3-5 steps with a railing? : A Lot 6 Click Score: 17    End of Session Equipment Utilized During Treatment: Gait belt Activity Tolerance: Patient tolerated treatment well Patient left: in chair;with call bell/phone within reach;with family/visitor present Nurse Communication: Mobility status;Other (comment) (SpO2, mepilex soiled) PT Visit Diagnosis: Other abnormalities of gait and mobility (R26.89);Muscle weakness (generalized) (M62.81);Pain Pain - Right/Left: Left Pain - part of body: Hip    Time: [id number] PT Time Calculation (min) (ACUTE ONLY): 51 min   Charges:   PT Evaluation $PT Eval Low Complexity: 1 Low PT Treatments $Gait Training: 8-22 mins $Therapeutic Activity: 8-22 mins         Tori Zalma Channing PT, DPT 04/18/22, 2:24 PM

## 2022-04-18 NOTE — Progress Notes (Addendum)
Physical Therapy Treatment Patient Details Name: Jamie Macdonald MRN: [id number] DOB: 1945-01-18 Today's Date: 04/18/2022   History of Present Illness Jamie Macdonald is a 77 y.o. female s/p L THA AA 4/3 for L femur fracture. PMH: CKD, HTN, diabetes    PT Comments    Pt up in chair since earlier session, reporting discomfort and wanting to get back to bed. Pt powers to stand with min guard, min A for pivoting to bed, heavy cues for hand placement and managing RW safely. Pt needing min A to lift LLE back into bed to return to supine. Nursing in room to complete bladder scan; pt left supine with nursing and 2 daughters at bedside.    Recommendations for follow up therapy are one component of a multi-disciplinary discharge planning process, led by the attending physician.  Recommendations may be updated based on patient status, additional functional criteria and insurance authorization.  Follow Up Recommendations  Can patient physically be transported by private vehicle: Yes    Assistance Recommended at Discharge Frequent or constant Supervision/Assistance  Patient can return home with the following A little help with walking and/or transfers;A little help with bathing/dressing/bathroom;Assistance with cooking/housework;Assist for transportation   Equipment Recommendations  Rolling walker (2 wheels) (youth height 4'10")    Recommendations for Other Services       Precautions / Restrictions Precautions Precautions: Fall Restrictions Weight Bearing Restrictions: No     Mobility  Bed Mobility Overal bed mobility: Needs Assistance Bed Mobility: Sit to Supine Sit to supine: Min assist  General bed mobility comments: min A to lift LLE back into bed, increased time and cues for sequencing    Transfers Overall transfer level: Needs assistance Equipment used: Rolling walker (2 wheels) Transfers: Sit to/from Stand, Bed to chair/wheelchair/BSC Sit to Stand: Min guard Stand pivot transfers: Min  assist  General transfer comment: min G to power to stand, min A to pivot recliner to bed, cues and assist for hand placement and maneuvering RW safely    Ambulation/Gait  General Gait Details: pt uncomfortable from being in chair, requests return to bed   Stairs             Wheelchair Mobility    Modified Rankin (Stroke Patients Only)       Balance Overall balance assessment: Needs assistance Sitting-balance support: Feet supported, Bilateral upper extremity supported Sitting balance-Leahy Scale: Fair  Standing balance support: Reliant on assistive device for balance, During functional activity, Bilateral upper extremity supported Standing balance-Leahy Scale: Poor     Cognition Arousal/Alertness: Awake/alert Behavior During Therapy: WFL for tasks assessed/performed Overall Cognitive Status: Within Functional Limits for tasks assessed     Exercises      General Comments General comments (skin integrity, edema, etc.): Pt on RA with SpO2 98-100%      Pertinent Vitals/Pain Pain Assessment Pain Assessment: Faces Faces Pain Scale: Hurts a little bit Pain Location: L hip and bottom Pain Descriptors / Indicators: Sore, Tender Pain Intervention(s): Limited activity within patient's tolerance, Monitored during session, Repositioned    Home Living Family/patient expects to be discharged to:: Private residence Living Arrangements: Alone Available Help at Discharge: Family;Available PRN/intermittently Type of Home: House (condo) Home Access: Level entry       Home Layout: One level Home Equipment: None      Prior Function            PT Goals (current goals can now be found in the care plan section) Acute Rehab PT Goals Patient Stated  Goal: regain independence PT Goal Formulation: With patient/family Time For Goal Achievement: 05/02/22 Potential to Achieve Goals: Good Progress towards PT goals: Progressing toward goals    Frequency    Min  3X/week      PT Plan Current plan remains appropriate    Co-evaluation              AM-PAC PT "6 Clicks" Mobility   Outcome Measure  Help needed turning from your back to your side while in a flat bed without using bedrails?: A Little Help needed moving from lying on your back to sitting on the side of a flat bed without using bedrails?: A Little Help needed moving to and from a bed to a chair (including a wheelchair)?: A Little Help needed standing up from a chair using your arms (e.g., wheelchair or bedside chair)?: A Little Help needed to walk in hospital room?: A Little Help needed climbing 3-5 steps with a railing? : A Lot 6 Click Score: 17    End of Session Equipment Utilized During Treatment: Gait belt Activity Tolerance: Patient tolerated treatment well;Other (comment) (nursing in room for bladder scan) Patient left: in bed;with call bell/phone within reach;with family/visitor present;with nursing/sitter in room Nurse Communication: Mobility status;Other (comment) (SpO2, mepilex soiled) PT Visit Diagnosis: Other abnormalities of gait and mobility (R26.89);Muscle weakness (generalized) (M62.81);Pain Pain - Right/Left: Left Pain - part of body: Hip     Time: [id number] PT Time Calculation (min) (ACUTE ONLY): 18 min  Charges:  $Therapeutic Activity: 8-22 mins                      Tori Thresia Ramanathan PT, DPT 04/18/22, 2:51 PM

## 2022-04-18 NOTE — Progress Notes (Cosign Needed)
    HPI: Patient is a 77 year old female who is POD- 1 from a s/p total hip arthroplasty, LEFT after a closed femoral neck fracture, LEFT Patient seen by Selinda Michaels PA-S with Costella Hatcher PA-C for Dr. Alvan Dame    Patient is currently lying comfortably in bed and states that she slept well.  Patient reports that pain is currently well controled with pain medication. Patient has not yet ambulated with PT. No acute events overnight.  Teds, SCD's and ice packs are in place. Patient denies chest pain, abdominal pain and SHOB.      Vitals reviewed I/O reviewed Labs reviewed Medications reviewed PMH reviewed Images reviewed  Hgb: 9.6, acute blood loss anemia K: 3.8, stable  Cr: 1.01, slightly elevated  Post-op Imaging: Intraoperative X-ray of hip and pelvis show no evidence of intra-operative fractures, prothesis is aligned and in place.    Physical Exam:   General: well appearing female who is alert, cooperative, pleasant and in NAD Skin: warm, dry and intact Resp: Normal effort of respiration, no signs of respiratory distress   MSK: left lower extremity is warm but not erythematous, there is minimal swelling of the left lower extremity localized around the hip joint. There is no ecchymosis. Compartments are soft. Dressing is C/D/I. Patient is able to wiggle toes. Plantarflexion and dorsiflexion are intact.  Neurovascular: sensation and distal pulses are intact in bilateral lower extremities        Assessment: Total hip arthroplasty, LEFT Closed femoral neck fracture, LEFT   Plan: Begin disposition to SNF Continue to monitor vitals  Begin PT WBAT Continue DVT Prophylaxis: Lovenox HEP Follow-up in the clinic is scheduled for 2 weeks post-op with Dr. Alvan Dame   Patient to be discharged to SNF within the next few days if goals are met with PT and pain is well controlled.  PDMP was reviewed before opioids were prescribed to patient.    Signed Selinda Michaels PA-S

## 2022-04-18 NOTE — Progress Notes (Signed)
[facility] PROGRESS NOTE    Progress Note  Jamie Macdonald  [id number] DOB: 05-Nov-1945 DOA: 04/17/2022 PCP: Kathyrn Lass, MD     Brief Narrative:   Jamie Macdonald is an 77 y.o. female past medical history significant for chronic kidney disease stage III, essential hypertension diabetes mellitus who came in for left femur fracture  Assessment/Plan:   Other fracture of left femur, initial encounter for closed fracture Orthopedic surgery was consulted, she status post left total hip arthroplasty on 04/17/2022. Analgesics and anticoagulation per orthopedic surgery. PT OT evaluation is pending, she will probably need skilled nursing facility consult TOC.  Essential hypertension Blood pressure is fairly controlled continue losartan.  Diabetes mellitus type 2 in nonobese Continue sliding scale insulin.  CBGs before meals and at bedtime check an A1c.  CKD (chronic kidney disease) stage IIIa: Creatinine appears to be at baseline.  Sacral decubitus ulcer stage II present on admission: RN Pressure Injury Documentation: Pressure Injury 04/17/22 Anus Mid;Right;Left Stage 2 -  Partial thickness loss of dermis presenting as a shallow open injury with a red, pink wound bed without slough. red, light bleeding, anus area where patient wipes (Active)  04/17/22 1430  Location: Anus  Location Orientation: Mid;Right;Left  Staging: Stage 2 -  Partial thickness loss of dermis presenting as a shallow open injury with a red, pink wound bed without slough.  Wound Description (Comments): red, light bleeding, anus area where patient wipes  Present on Admission:   Dressing Type Foam - Lift dressing to assess site every shift 04/18/22 0700    DVT prophylaxis: lovenox Family Communication:daughter Status is: Inpatient Remains inpatient appropriate because: Acute left hip fracture    Code Status:     Code Status Orders  (From admission, onward)           Start     Ordered   04/17/22 1339   Full code  Continuous       Question:  By:  Answer:  Consent: discussion documented in EHR   04/17/22 1339           Code Status History     Date Active Date Inactive Code Status Order ID Comments User Context   03/19/2018 1531 03/23/2018 1404 Full Code [id number]  Lahoma Crocker, MD Inpatient   01/30/2018 0804 01/30/2018 1154 Full Code [id number]  Janyth Pupa, DO Inpatient      Advance Directive Documentation    Flowsheet Row Most Recent Value  Type of Advance Directive Healthcare Power of [place], Living will  Pre-existing out of facility DNR order (yellow form or pink MOST form) --  "MOST" Form in Place? --         IV Access:   Peripheral IV   Procedures and diagnostic studies:   DG Pelvis Portable  Result Date: 04/17/2022 CLINICAL DATA:  Postop hip arthroplasty EXAM: PORTABLE PELVIS 1-2 VIEWS COMPARISON:  04/15/2022 FINDINGS: Interval left hip replacement with intact hardware and normal alignment. Gas in the soft tissues consistent with recent surgery. Radiopaque material within the colon and rectum IMPRESSION: Status post left hip replacement with expected postsurgical change. Electronically Signed   By: Donavan Foil M.D.   On: 04/17/2022 20:21   DG HIP UNILAT WITH PELVIS 1V LEFT  Result Date: 04/17/2022 CLINICAL DATA:  Intraoperative total left hip replacement. EXAM: DG HIP (WITH OR WITHOUT PELVIS) 1V*L* COMPARISON:  April 15, 2022 FINDINGS: Intraoperative images from total left hip arthroplasty demonstrate expected alignment of the orthopedic hardware. IMPRESSION: Intraoperative images from total  left hip arthroplasty. Fluoroscopy time is recorded as 8 seconds. Electronically Signed   By: Fidela Salisbury M.D.   On: 04/17/2022 18:58   DG C-Arm 1-60 Min-No Report  Result Date: 04/17/2022 Fluoroscopy was utilized by the requesting physician.  No radiographic interpretation.     Medical Consultants:   None.   Subjective:    Jamie Macdonald no  complaints  Objective:    Vitals:   04/17/22 2052 04/17/22 2305 04/18/22 0130 04/18/22 0540  BP: (!) 118/58 124/67 112/78 126/72  Pulse: 81 76 86 98  Resp: 15 17 17 17   Temp: (!) 97.5 F (36.4 C) (!) 97.4 F (36.3 C) 98 F (36.7 C) 97.8 F (36.6 C)  TempSrc:  Oral Oral Oral  SpO2: 100% 100% 100% 100%  Weight:      Height:       SpO2: 100 % O2 Flow Rate (L/min): 2 L/min   Intake/Output Summary (Last 24 hours) at 04/18/2022 0748 Last data filed at 04/18/2022 0700 Gross per 24 hour  Intake 3131.33 ml  Output 1250 ml  Net 1881.33 ml   Filed Weights   04/17/22 1204  Weight: 49.9 kg    Exam: General exam: In no acute distress. Respiratory system: Good air movement and clear to auscultation. Cardiovascular system: S1 & S2 heard, RRR. No JVD. Gastrointestinal system: Abdomen is nondistended, soft and nontender.  Extremities: No pedal edema. Skin: No rashes, lesions or ulcers Psychiatry: Judgement and insight appear normal. Mood & affect appropriate.    Data Reviewed:    Labs: Basic Metabolic Panel: Recent Labs  Lab 04/17/22 1156 04/18/22 0333  NA 137 136  K 3.9 3.8  CL 102 105  CO2 26 25  GLUCOSE 219* 132*  BUN 33* 29*  CREATININE 1.12* 1.01*  CALCIUM 10.1 8.6*   GFR Estimated Creatinine Clearance: 33.3 mL/min (A) (by C-G formula based on SCr of 1.01 mg/dL (H)). Liver Function Tests: No results for input(s): "AST", "ALT", "ALKPHOS", "BILITOT", "PROT", "ALBUMIN" in the last 168 hours. No results for input(s): "LIPASE", "AMYLASE" in the last 168 hours. No results for input(s): "AMMONIA" in the last 168 hours. Coagulation profile Recent Labs  Lab 04/17/22 1156  INR 1.1   COVID-19 Labs  No results for input(s): "DDIMER", "FERRITIN", "LDH", "CRP" in the last 72 hours.  No results found for: "SARSCOV2NAA"  CBC: Recent Labs  Lab 04/17/22 1156 04/18/22 0333  WBC 9.2 7.1  NEUTROABS 8.0*  --   HGB 13.3 9.6*  HCT 39.9 29.2*  MCV 89.9 90.7  PLT 212  117*   Cardiac Enzymes: No results for input(s): "CKTOTAL", "CKMB", "CKMBINDEX", "TROPONINI" in the last 168 hours. BNP (last 3 results) No results for input(s): "PROBNP" in the last 8760 hours. CBG: Recent Labs  Lab 04/17/22 1619 04/17/22 1915 04/17/22 2129 04/18/22 0712  GLUCAP 157* 139* 146* 149*   D-Dimer: No results for input(s): "DDIMER" in the last 72 hours. Hgb A1c: Recent Labs    04/17/22 1211  HGBA1C 6.7*   Lipid Profile: No results for input(s): "CHOL", "HDL", "LDLCALC", "TRIG", "CHOLHDL", "LDLDIRECT" in the last 72 hours. Thyroid function studies: No results for input(s): "TSH", "T4TOTAL", "T3FREE", "THYROIDAB" in the last 72 hours.  Invalid input(s): "FREET3" Anemia work up: No results for input(s): "VITAMINB12", "FOLATE", "FERRITIN", "TIBC", "IRON", "RETICCTPCT" in the last 72 hours. Sepsis Labs: Recent Labs  Lab 04/17/22 1156 04/18/22 0333  WBC 9.2 7.1   Microbiology Recent Results (from the past 240 hour(s))  Surgical pcr screen  Status: None   Collection Time: 04/17/22  2:30 PM   Specimen: Nasal Mucosa; Nasal Swab  Result Value Ref Range Status   MRSA, PCR NEGATIVE NEGATIVE Final   Staphylococcus aureus NEGATIVE NEGATIVE Final    Comment: (NOTE) The Xpert SA Assay (FDA approved for NASAL specimens in patients 4 years of age and older), is one component of a comprehensive surveillance program. It is not intended to diagnose infection nor to guide or monitor treatment. Performed at [facility], [place] [address], [place], [place] [address]      Medications:    dexamethasone (DECADRON) injection  10 mg Intravenous Once   docusate sodium  100 mg Oral BID   enoxaparin (LOVENOX) injection  30 mg Subcutaneous Q24H   insulin aspart  0-15 Units Subcutaneous TID WC   insulin aspart  0-5 Units Subcutaneous QHS   losartan  50 mg Oral Daily   polyethylene glycol  17 g Oral BID   Continuous Infusions:  sodium chloride 75  mL/hr at 04/17/22 2100   methocarbamol (ROBAXIN) IV        LOS: 1 day   Charlynne Cousins  [facility]  04/18/2022, 7:48 AM

## 2022-04-18 NOTE — [place] FL2 (Signed)
MEDICAID FL2 LEVEL OF CARE FORM     IDENTIFICATION  Patient Name: Jamie Macdonald Birthdate: Jul 06, 1945 Sex: female Admission Date (Current Location): 04/17/2022  [facility] and [place] Number:  Herbalist and Address:  [facility],  [place], [place]      Provider Number: [id number]  Attending Physician Name and Address:  Charlynne Cousins, MD  Relative Name and Phone Number:  Laurell Roof (daughter) Ph: [phone]    Current Level of Care: Hospital Recommended Level of Care: [place] Prior Approval Number:    Date Approved/Denied:   PASRR Number: [id number]  Discharge Plan: SNF    Current Diagnoses: Patient Active Problem List   Diagnosis Date Noted   Other fracture of left femur, initial encounter for closed fracture 04/17/2022   CKD (chronic kidney disease) 04/17/2022   S/P total left hip arthroplasty 04/17/2022   Pressure injury of skin 04/17/2022   Hyponatremia 03/22/2018   Hyponatremia with decreased serum osmolality 123456   Acute metabolic encephalopathy 123456   Endometrial cancer 03/19/2018   Essential hypertension 01/16/2018   Diabetes mellitus type 2 in nonobese 01/16/2018   Uterine prolapse 01/16/2018    Orientation RESPIRATION BLADDER Height & Weight     Self, Time, Place  Normal Continent Weight: 110 lb (49.9 kg) Height:  4\' 10"  (147.3 cm)  BEHAVIORAL SYMPTOMS/MOOD NEUROLOGICAL BOWEL NUTRITION STATUS   (N/A)  (N/A) Incontinent Diet (Regular diet)  AMBULATORY STATUS COMMUNICATION OF NEEDS Skin   Limited Assist Verbally Surgical wounds, Other (Comment), Skin abrasions (Abrasions: right elbow, left foot; Ecchymosis: bilateral legs; Erythema: buttocks)                       Personal Care Assistance Level of Assistance  Bathing, Feeding, Dressing Bathing Assistance: Limited assistance Feeding assistance: Independent Dressing Assistance: Limited assistance      Functional Limitations Info  Sight, Hearing, Speech Sight Info: Impaired Hearing Info: Adequate Speech Info: Adequate    SPECIAL CARE FACTORS FREQUENCY  PT (By licensed PT), OT (By licensed OT)     PT Frequency: 5x's/week OT Frequency: 5x's/week            Contractures Contractures Info: Not present    Additional Factors Info  Code Status, Allergies, Psychotropic, Insulin Sliding Scale Code Status Info: Full Allergies Info: Nsaids, Aspirin, Estradiol, Statins Psychotropic Info: Desyrel Insulin Sliding Scale Info: See discharge summary       Current Medications (04/18/2022):  This is the current hospital active medication list Current Facility-Administered Medications  Medication Dose Route Frequency Provider Last Rate Last Admin   0.9 %  sodium chloride infusion   Intravenous Continuous Irving Copas, PA-C 75 mL/hr at 04/18/22 1423 New Bag at 04/18/22 1423   acetaminophen (TYLENOL) tablet 325-650 mg  325-650 mg Oral Q6H PRN Irving Copas, PA-C       albuterol (PROVENTIL) (2.5 MG/3ML) 0.083% nebulizer solution 2.5 mg  2.5 mg Nebulization Q2H PRN Irving Copas, PA-C       bisacodyl (DULCOLAX) suppository 10 mg  10 mg Rectal Daily PRN Irving Copas, PA-C       diphenhydrAMINE (BENADRYL) 12.5 MG/5ML elixir 12.5-25 mg  12.5-25 mg Oral Q4H PRN Irving Copas, PA-C       docusate sodium (COLACE) capsule 100 mg  100 mg Oral BID Irving Copas, PA-C   100 mg at 04/17/22 2237   enoxaparin (LOVENOX) injection 30 mg  30 mg  Subcutaneous Q24H Corky Sing, [place]   30 mg at 04/18/22 1416   HYDROcodone-acetaminophen (NORCO/VICODIN) 5-325 MG per tablet 1-2 tablet  1-2 tablet Oral Q4H PRN Irving Copas, PA-C   1 tablet at 04/18/22 [id number]   insulin aspart (novoLOG) injection 0-5 Units  0-5 Units Subcutaneous QHS Hollice Gong, Mir M, MD       insulin aspart (novoLOG) injection 0-5 Units  0-5 Units Subcutaneous QHS Charlynne Cousins, MD       insulin aspart  (novoLOG) injection 0-9 Units  0-9 Units Subcutaneous TID WC Charlynne Cousins, MD   5 Units at 04/18/22 1217   insulin aspart (novoLOG) injection 3 Units  3 Units Subcutaneous TID WC Charlynne Cousins, MD   3 Units at 04/18/22 1218   losartan (COZAAR) tablet 50 mg  50 mg Oral Daily Irving Copas, PA-C   50 mg at 04/18/22 [id number]   menthol-cetylpyridinium (CEPACOL) lozenge 3 mg  1 lozenge Oral PRN Irving Copas, PA-C       Or   phenol (CHLORASEPTIC) mouth spray 1 spray  1 spray Mouth/Throat PRN Irving Copas, PA-C       methocarbamol (ROBAXIN) tablet 500 mg  500 mg Oral Q6H PRN Irving Copas, PA-C       Or   methocarbamol (ROBAXIN) 500 mg in dextrose 5 % 50 mL IVPB  500 mg Intravenous Q6H PRN Irving Copas, PA-C       metoCLOPramide (REGLAN) tablet 5 mg  5 mg Oral Q8H PRN Irving Copas, PA-C       Or   metoCLOPramide (REGLAN) injection 5 mg  5 mg Intravenous Q8H PRN Irving Copas, PA-C       morphine (PF) 2 MG/ML injection 0.5-1 mg  0.5-1 mg Intravenous Q2H PRN Irving Copas, PA-C       ondansetron [facility]) tablet 4 mg  4 mg Oral Q6H PRN Irving Copas, PA-C       Or   ondansetron [facility]) injection 4 mg  4 mg Intravenous Q6H PRN Costella Hatcher R, PA-C       polyethylene glycol (MIRALAX / GLYCOLAX) packet 17 g  17 g Oral BID Irving Copas, PA-C   17 g at 04/17/22 2237   traZODone (DESYREL) tablet 25 mg  25 mg Oral QHS PRN Irving Copas, PA-C         Discharge Medications: Please see discharge summary for a list of discharge medications.  Relevant Imaging Results:  Relevant Lab Results:   Additional Information SSN: [id number]  Sherie Don, LCSW

## 2022-04-18 NOTE — Plan of Care (Signed)
  Problem: Education: Goal: Ability to describe self-care measures that may prevent or decrease complications (Diabetes Survival Skills Education) will improve Outcome: Progressing Goal: Individualized Educational Video(s) Outcome: Progressing   Problem: Coping: Goal: Ability to adjust to condition or change in health will improve Outcome: Progressing   Problem: Fluid Volume: Goal: Ability to maintain a balanced intake and output will improve Outcome: Progressing   Problem: Health Behavior/Discharge Planning: Goal: Ability to identify and utilize available resources and services will improve Outcome: Progressing Goal: Ability to manage health-related needs will improve Outcome: Progressing   Problem: Metabolic: Goal: Ability to maintain appropriate glucose levels will improve Outcome: Progressing   Problem: Nutritional: Goal: Maintenance of adequate nutrition will improve Outcome: Progressing Goal: Progress toward achieving an optimal weight will improve Outcome: Progressing   Problem: Skin Integrity: Goal: Risk for impaired skin integrity will decrease Outcome: Progressing   Problem: Tissue Perfusion: Goal: Adequacy of tissue perfusion will improve Outcome: Progressing   Problem: Education: Goal: Knowledge of General Education information will improve Description: Including pain rating scale, medication(s)/side effects and non-pharmacologic comfort measures Outcome: Progressing   Problem: Health Behavior/Discharge Planning: Goal: Ability to manage health-related needs will improve Outcome: Progressing   Problem: Clinical Measurements: Goal: Ability to maintain clinical measurements within normal limits will improve Outcome: Progressing Goal: Will remain free from infection Outcome: Progressing Goal: Diagnostic test results will improve Outcome: Progressing Goal: Respiratory complications will improve Outcome: Progressing Goal: Cardiovascular complication will  be avoided Outcome: Progressing   Problem: Activity: Goal: Risk for activity intolerance will decrease Outcome: Progressing   Problem: Nutrition: Goal: Adequate nutrition will be maintained Outcome: Progressing   Problem: Coping: Goal: Level of anxiety will decrease Outcome: Progressing   Problem: Elimination: Goal: Will not experience complications related to bowel motility Outcome: Progressing Goal: Will not experience complications related to urinary retention Outcome: Progressing   Problem: Pain Managment: Goal: General experience of comfort will improve Outcome: Progressing   Problem: Safety: Goal: Ability to remain free from injury will improve Outcome: Progressing   Problem: Skin Integrity: Goal: Risk for impaired skin integrity will decrease Outcome: Progressing   Problem: Education: Goal: Knowledge of the prescribed therapeutic regimen will improve Outcome: Progressing Goal: Understanding of discharge needs will improve Outcome: Progressing Goal: Individualized Educational Video(s) Outcome: Progressing   Problem: Activity: Goal: Ability to avoid complications of mobility impairment will improve Outcome: Progressing Goal: Ability to tolerate increased activity will improve Outcome: Progressing   Problem: Clinical Measurements: Goal: Postoperative complications will be avoided or minimized Outcome: Progressing   Problem: Pain Management: Goal: Pain level will decrease with appropriate interventions Outcome: Progressing   Problem: Skin Integrity: Goal: Will show signs of wound healing Outcome: Progressing   

## 2022-04-18 NOTE — Progress Notes (Signed)
   Subjective: 1 Day Post-Op Procedure(s) (LRB): TOTAL HIP ARTHROPLASTY ANTERIOR APPROCH (Left) Patient reports pain as mild.   Patient seen in rounds for Dr. Alvan Dame. Patient is resting in bed on exam this morning. No acute events overnight. She has not been up with PT yet. She tells me she lives alone, and feels she will need a permanent assisted living facility.  We will start therapy today.   Objective: Vital signs in last 24 hours: Temp:  [97.4 F (36.3 C)-98.6 F (37 C)] 97.7 F (36.5 C) (04/04 0748) Pulse Rate:  [70-107] 94 (04/04 0748) Resp:  [10-24] 17 (04/04 0748) BP: (75-149)/(46-81) 144/81 (04/04 0748) SpO2:  [93 %-100 %] 100 % (04/04 0748) Weight:  [49.9 kg] 49.9 kg (04/03 1204)  Intake/Output from previous day:  Intake/Output Summary (Last 24 hours) at 04/18/2022 0850 Last data filed at 04/18/2022 0756 Gross per 24 hour  Intake 3191.33 ml  Output 1250 ml  Net 1941.33 ml     Intake/Output this shift: Total I/O In: 60 [P.O.:60] Out: 0   Labs: Recent Labs    04/17/22 1156 04/18/22 0333  HGB 13.3 9.6*   Recent Labs    04/17/22 1156 04/18/22 0333  WBC 9.2 7.1  RBC 4.44 3.22*  HCT 39.9 29.2*  PLT 212 117*   Recent Labs    04/17/22 1156 04/18/22 0333  NA 137 136  K 3.9 3.8  CL 102 105  CO2 26 25  BUN 33* 29*  CREATININE 1.12* 1.01*  GLUCOSE 219* 132*  CALCIUM 10.1 8.6*   Recent Labs    04/17/22 1156  INR 1.1    Exam: General - Patient is Alert and Appropriate Extremity - Neurologically intact Sensation intact distally Intact pulses distally Dorsiflexion/Plantar flexion intact Dressing - dressing C/D/I Motor Function - intact, moving foot and toes well on exam.   Past Medical History:  Diagnosis Date   Anemia    Cancer    ENDOMETRIAL CANCER   Chronic kidney disease    ckd STAGE 3LOV DR Janae Sauce NEPHROLOGY 12-12-17   Diabetes mellitus    Diabetes mellitus type 2 in nonobese 01/16/2018   Essential hypertension 01/16/2018   Exposure to  hepatitis C    AS CHILD   Exposure to TB 1973   AS CHILD   Fatty tumor    LEFT SHOULDER REMOVED   Fibroids    History of radiation therapy 05/07/2018-06/04/2018   vaginal brachytherapy   Dr Sondra Come   Hypertension    SLIGHT   Uterine prolapse 01/16/2018   UTI (urinary tract infection)    STARTED AMOXICILLIAN FRIDAY 03-13-2018    Assessment/Plan: 1 Day Post-Op Procedure(s) (LRB): TOTAL HIP ARTHROPLASTY ANTERIOR APPROCH (Left) Principal Problem:   Other fracture of left femur, initial encounter for closed fracture Active Problems:   Essential hypertension   Diabetes mellitus type 2 in nonobese   CKD (chronic kidney disease)   S/P total left hip arthroplasty   Pressure injury of skin  Estimated body mass index is 22.99 kg/m as calculated from the following:   Height as of this encounter: 4\' 10"  (1.473 m).   Weight as of this encounter: 49.9 kg. Advance diet Up with therapy  DVT Prophylaxis - Aspirin Weight bearing as tolerated.  Up with PT today. Disposition pending input, likely SNF  Griffith Citron, [place] Orthopedic Surgery [phone] 04/18/2022, 8:50 AM

## 2022-04-18 NOTE — TOC Initial Note (Signed)
Transition of Care [facility]) - Initial/Assessment Note   Patient Details  Name: Jamie Macdonald MRN: [id number] Date of Birth: [birth year]  [facility] CM/SW Contact:    Sherie Don, LCSW Phone Number: 04/18/2022, 2:39 PM  Clinical Narrative: PT evaluation recommended SNF. Patient and daughters are agreeable to short-term rehab. FL2 done; PASRR received. Initial referral faxed out for review. TOC awaiting bed offers.                 Expected Discharge Plan: Skilled Nursing Facility Barriers to Discharge: SNF Pending bed offer, Insurance Authorization, Continued Medical Work up  Patient Goals and CMS Choice Patient states their goals for this hospitalization and ongoing recovery are:: Go to rehab before returning home CMS Medicare.gov Compare Post Acute Care list provided to:: Patient Represenative (must comment) Choice offered to / list presented to : Patient, Adult Children  Expected Discharge Plan and Services In-house Referral: Clinical Social Work Post Acute Care Choice: [place] Living arrangements for the past 2 months: [place]           DME Arranged: N/A DME Agency: NA  Prior Living Arrangements/Services Living arrangements for the past 2 months: Single Family Home Patient language and need for interpreter reviewed:: Yes Do you feel safe going back to the place where you live?: Yes      Need for Family Participation in Patient Care: Yes (Comment) Care giver support system in place?: Yes (comment) Criminal Activity/Legal Involvement Pertinent to Current Situation/Hospitalization: No - Comment as needed  Activities of Daily Living Home Assistive Devices/Equipment: Wheelchair ADL Screening (condition at time of admission) Patient's cognitive ability adequate to safely complete daily activities?: Yes Is the patient deaf or have difficulty hearing?: No Does the patient have difficulty seeing, even when wearing glasses/contacts?: No Does the  patient have difficulty concentrating, remembering, or making decisions?: No Patient able to express need for assistance with ADLs?: Yes Does the patient have difficulty dressing or bathing?: No Independently performs ADLs?: Yes (appropriate for developmental age) Does the patient have difficulty walking or climbing stairs?: Yes Weakness of Legs: Both Weakness of Arms/Hands: None  Permission Sought/Granted Permission sought to share information with : Facility Art therapist granted to share information with : Yes, Verbal Permission Granted Permission granted to share info w AGENCY: SNFs  Emotional Assessment Appearance:: Appears stated age Attitude/Demeanor/Rapport: Engaged Affect (typically observed): Accepting, Appropriate Orientation: : Oriented to Self, Oriented to Place, Oriented to  Time Alcohol / Substance Use: Not Applicable Psych Involvement: No (comment)  Admission diagnosis:  Other fracture of left femur, initial encounter for closed fracture [S72.8X2A] Fall, initial encounter [W19.XXXA] Patient Active Problem List   Diagnosis Date Noted   Other fracture of left femur, initial encounter for closed fracture 04/17/2022   CKD (chronic kidney disease) 04/17/2022   S/P total left hip arthroplasty 04/17/2022   Pressure injury of skin 04/17/2022   Hyponatremia 03/22/2018   Hyponatremia with decreased serum osmolality 123456   Acute metabolic encephalopathy 123456   Endometrial cancer 03/19/2018   Essential hypertension 01/16/2018   Diabetes mellitus type 2 in nonobese 01/16/2018   Uterine prolapse 01/16/2018   PCP:  Kathyrn Lass, MD Pharmacy:   [facility], [place] [address] Phone: [phone] Fax: [phone]  Social Determinants of Health (SDOH) Social History: SDOH Screenings   Food Insecurity: No Food Insecurity (04/17/2022)  Housing: Low Risk   (04/17/2022)  Transportation  Needs: No Transportation Needs (04/17/2022)  Utilities: Not At Risk (04/17/2022)  Tobacco Use: Low Risk  (04/18/2022)   SDOH Interventions: Food Insecurity Interventions: Intervention Not Indicated Housing Interventions: Intervention Not Indicated Utilities Interventions: Intervention Not Indicated  Readmission Risk Interventions     No data to display

## 2022-04-19 DIAGNOSIS — I10 Essential (primary) hypertension: Secondary | ICD-10-CM

## 2022-04-19 DIAGNOSIS — S728X2A Other fracture of left femur, initial encounter for closed fracture: Secondary | ICD-10-CM | POA: Diagnosis not present

## 2022-04-19 LAB — GLUCOSE, CAPILLARY
Glucose-Capillary: 148 mg/dL — ABNORMAL HIGH (ref 70–99)
Glucose-Capillary: 130 mg/dL — ABNORMAL HIGH (ref 70–99)
Glucose-Capillary: 205 mg/dL — ABNORMAL HIGH (ref 70–99)
Glucose-Capillary: 101 mg/dL — ABNORMAL HIGH (ref 70–99)

## 2022-04-19 LAB — CBC
HCT: 28 % — ABNORMAL LOW (ref 36.0–46.0)
Hemoglobin: 9.1 g/dL — ABNORMAL LOW (ref 12.0–15.0)
MCH: 29.2 pg (ref 26.0–34.0)
MCHC: 32.5 g/dL (ref 30.0–36.0)
MCV: 89.7 fL (ref 80.0–100.0)
Platelets: 131 10*3/uL — ABNORMAL LOW (ref 150–400)
RBC: 3.12 MIL/uL — ABNORMAL LOW (ref 3.87–5.11)
RDW: 13 % (ref 11.5–15.5)
WBC: 7 10*3/uL (ref 4.0–10.5)
nRBC: 0 % (ref 0.0–0.2)

## 2022-04-19 LAB — HEMOGLOBIN A1C
Hgb A1c MFr Bld: 6.7 % — ABNORMAL HIGH (ref 4.8–5.6)
Mean Plasma Glucose: 146 mg/dL

## 2022-04-19 MED ORDER — POLYETHYLENE GLYCOL 3350 17 G PO PACK: 17.0000 g | PACK | Freq: Two times a day (BID) | ORAL | 0 refills | Status: DC

## 2022-04-19 MED ORDER — ASPIRIN 81 MG PO CHEW: 81.0000 mg | CHEWABLE_TABLET | Freq: Two times a day (BID) | ORAL | 0 refills | Status: AC

## 2022-04-19 MED ORDER — HYDROCODONE-ACETAMINOPHEN 5-325 MG PO TABS: 1.0000 | ORAL_TABLET | ORAL | 0 refills | Status: AC | PRN

## 2022-04-19 MED ORDER — METHOCARBAMOL 500 MG PO TABS: 500.0000 mg | ORAL_TABLET | Freq: Three times a day (TID) | ORAL | 0 refills | Status: DC | PRN

## 2022-04-19 NOTE — Progress Notes (Signed)
Physical Therapy Treatment Patient Details Name: Jamie Macdonald MRN: [id number] DOB: [birth year] Today's Date: 04/19/2022   History of Present Illness [name] is a 77 y.o. female s/p L THA AA 4/3 for L femur fracture. PMH: CKD, HTN, diabetes    PT Comments    Pt motivated and progressing well with mobility but requiring repeated cues for technique and for basic safety awareness.   Recommendations for follow up therapy are one component of a multi-disciplinary discharge planning process, led by the attending physician.  Recommendations may be updated based on patient status, additional functional criteria and insurance authorization.  Follow Up Recommendations  Can patient physically be transported by private vehicle: Yes    Assistance Recommended at Discharge Frequent or constant Supervision/Assistance  Patient can return home with the following A little help with walking and/or transfers;A little help with bathing/dressing/bathroom;Assistance with cooking/housework;Assist for transportation   Equipment Recommendations  Rolling walker (2 wheels)    Recommendations for Other Services       Precautions / Restrictions Precautions Precautions: Fall Restrictions Weight Bearing Restrictions: No Other Position/Activity Restrictions: WBAT     Mobility  Bed Mobility Overal bed mobility: Needs Assistance Bed Mobility: Supine to Sit     Supine to sit: Min assist     General bed mobility comments: cues for sequence and use of R LE to self assist    Transfers Overall transfer level: Needs assistance Equipment used: Rolling walker (2 wheels) Transfers: Sit to/from Stand Sit to Stand: Min guard           General transfer comment: cues for LE management and use of UEs to self assist    Ambulation/Gait Ambulation/Gait assistance: Min assist, Min guard Gait Distance (Feet): 150 Feet Assistive device: Rolling walker (2 wheels) Gait Pattern/deviations: Decreased stride  length, Narrow base of support, Step-to pattern, Step-through pattern, Antalgic Gait velocity: decreased     General Gait Details: cues for posture, position from RW, initial sequence and to increase BOS   Stairs             Wheelchair Mobility    Modified Rankin (Stroke Patients Only)       Balance Overall balance assessment: Needs assistance Sitting-balance support: Feet supported, No upper extremity supported Sitting balance-Leahy Scale: Good     Standing balance support: Reliant on assistive device for balance, During functional activity, Bilateral upper extremity supported Standing balance-Leahy Scale: Poor                              Cognition Arousal/Alertness: Awake/alert Behavior During Therapy: WFL for tasks assessed/performed Overall Cognitive Status: History of cognitive impairments - at baseline                                 General Comments: memory deficits and delayed processing to answer questions and follow cues        Exercises Total Joint Exercises Ankle Circles/Pumps: AROM, Both, 15 reps, Supine Quad Sets: AROM, Both, 10 reps, Supine Heel Slides: AAROM, Left, 20 reps, Supine Hip ABduction/ADduction: AAROM, Left, 15 reps, Supine    General Comments        Pertinent Vitals/Pain Pain Assessment Pain Assessment: 0-10 Pain Score: 3  Pain Location: L hip Pain Descriptors / Indicators: Sore, Tender Pain Intervention(s): Limited activity within patient's tolerance, Monitored during session, Premedicated before session    Home Living  Prior Function            PT Goals (current goals can now be found in the care plan section) Acute Rehab PT Goals Patient Stated Goal: regain independence PT Goal Formulation: With patient/family Time For Goal Achievement: 05/02/22 Potential to Achieve Goals: Good Progress towards PT goals: Progressing toward goals    Frequency    Min  3X/week      PT Plan Current plan remains appropriate    Co-evaluation              AM-PAC PT "6 Clicks" Mobility   Outcome Measure  Help needed turning from your back to your side while in a flat bed without using bedrails?: A Little Help needed moving from lying on your back to sitting on the side of a flat bed without using bedrails?: A Little Help needed moving to and from a bed to a chair (including a wheelchair)?: A Little Help needed standing up from a chair using your arms (e.g., wheelchair or bedside chair)?: A Little Help needed to walk in hospital room?: A Little Help needed climbing 3-5 steps with a railing? : A Lot 6 Click Score: 17    End of Session Equipment Utilized During Treatment: Gait belt Activity Tolerance: Patient tolerated treatment well;Other (comment) Patient left: in chair;with call bell/phone within reach;with chair alarm set;with family/visitor present Nurse Communication: Mobility status PT Visit Diagnosis: Other abnormalities of gait and mobility (R26.89);Muscle weakness (generalized) (M62.81);Pain Pain - Right/Left: Left Pain - part of body: Hip     Time: 0939-1010 PT Time Calculation (min) (ACUTE ONLY): 31 min  Charges:  $Gait Training: 8-22 mins $Therapeutic Exercise: 8-22 mins                     Mauro Kaufmann PT Acute Rehabilitation Services Pager [phone] Office [phone]    [facility] 04/19/2022, 12:39 PM

## 2022-04-19 NOTE — Plan of Care (Signed)
  Problem: Education: Goal: Knowledge of General Education information will improve Description: Including pain rating scale, medication(s)/side effects and non-pharmacologic comfort measures Outcome: Progressing   Problem: Health Behavior/Discharge Planning: Goal: Ability to manage health-related needs will improve Outcome: Progressing   Problem: Pain Managment: Goal: General experience of comfort will improve Outcome: Progressing   Problem: Education: Goal: Knowledge of the prescribed therapeutic regimen will improve Outcome: Progressing   Problem: Activity: Goal: Ability to avoid complications of mobility impairment will improve Outcome: Progressing   Problem: Clinical Measurements: Goal: Postoperative complications will be avoided or minimized Outcome: Progressing

## 2022-04-19 NOTE — Progress Notes (Signed)
Would prefer aspirin 81 mg chewable BID x4 weeks as DVT prophylaxis over lovenox, at least at discharge. I do not feel this should have significant impact on her kidney function short term.  Medical team can decide if they do not agree with this recommendation.   Rx printed for SNF  Rosalene Billings, PA-C Orthopedic Surgery [facility] [place] [phone]

## 2022-04-19 NOTE — Discharge Summary (Signed)
Physician Discharge Summary  Jamie MatteLois A Macdonald ZOX:[id number] DOB: 1945-12-04 DOA: 04/17/2022  PCP: Sigmund HazelMiller, Lisa, MD  Admit date: 04/17/2022 Discharge date: 04/20/2022  Admitted From: Home Disposition:  SNF  Recommendations for Outpatient Follow-up:  Follow up with Ortho in 1-2 weeks Please obtain BMP/CBC in one week Reevaluate hemoglobin A1c in 3 months if blood glucose continues to be elevated she might benefit from metformin.   Home Health:No Equipment/Devices:None  Discharge Condition:Stable CODE STATUS:Full Diet recommendation: Heart Healthy  Brief/Interim Summary: 77 y.o. female past medical history significant for chronic kidney disease stage III, essential hypertension diabetes mellitus who came in for left femur fracture   Discharge Diagnoses:  Principal Problem:   Other fracture of left femur, initial encounter for closed fracture Active Problems:   Essential hypertension   Diabetes mellitus type 2 in nonobese   CKD (chronic kidney disease)   S/P total left hip arthroplasty   Pressure injury of skin  Acute left femur fracture, closed: Orthopedic surgery was consulted she status post left total hip replacement on 04/17/2022. Anticoagulation aspirin p.o. twice daily and analgesics per orthopedic surgery. PT evaluated the patient, she was transferred to a skilled nursing facility.  Essential hypertension: No changes to her blood pressure medication.  Diabetes mellitus type 2: A1c of 6.7, continue diet. Will have to be reevaluated by PCP in 4 to 6 weeks. She required minimal insulin while in house.  Chronic kidney disease stage IIIa: Her creatinine appears to be at baseline.  Significant results stage II present on admission: Noted.    Discharge Instructions  Discharge Instructions     Diet - low sodium heart healthy   Complete by: As directed    Diet - low sodium heart healthy   Complete by: As directed    Increase activity slowly   Complete by: As directed     Increase activity slowly   Complete by: As directed    No wound care   Complete by: As directed    No wound care   Complete by: As directed       Allergies as of 04/20/2022       Reactions   Nsaids Other (See Comments)   Due to chronic kidney disease, pt does not take any celebrex, motrin, aleve etc.   Aspirin Other (See Comments)   Not supposed to take this   Estradiol Other (See Comments)   "Severe allergic reaction"   Statins Other (See Comments)   "Her nephrologist does not want her on statins unless her cholesterol is elevated."        Medication List     TAKE these medications    Accu-Chek Aviva Plus test strip Generic drug: glucose blood   Accu-Chek Aviva Plus w/Device Kit   Accu-Chek Softclix Lancets lancets   Alive Diabetic Multivitamin Tabs Take 1-2 tablets by mouth daily with breakfast.   aspirin 81 MG chewable tablet Commonly known as: Aspirin Childrens Chew 1 tablet (81 mg total) by mouth in the morning and at bedtime for 28 days.   bismuth subsalicylate 262 MG chewable tablet Commonly known as: PEPTO BISMOL Chew 524 mg by mouth every 6 (six) hours as needed for indigestion.   HYDROcodone-acetaminophen 5-325 MG tablet Commonly known as: NORCO/VICODIN Take 1-2 tablets by mouth every 4 (four) hours as needed for up to 3 days for moderate pain or severe pain.   losartan 50 MG tablet Commonly known as: COZAAR Take 50 mg by mouth daily.   methocarbamol 500 MG tablet Commonly  known as: ROBAXIN Take 1 tablet (500 mg total) by mouth every 8 (eight) hours as needed for muscle spasms.   polyethylene glycol 17 g packet Commonly known as: MIRALAX / GLYCOLAX Take 17 g by mouth 2 (two) times daily.   UNABLE TO FIND Place 1 suppository vaginally 2 (two) times daily. Med Name: Vitamin E Vaginal Suppositories from [facility]   vitamin E 180 MG (400 UNITS) capsule Take 400 Units by mouth daily.        Contact information for follow-up  providers     Durene Romanslin, Matthew, MD. Schedule an appointment as soon as possible for a visit in 2 week(s).   Specialty: Orthopedic Surgery Contact information: [address] [place] [address] [phone]              Contact information for after-discharge care     Destination     [facility] .   Service: Skilled Nursing Contact information: [address] [place] [id number] [phone]                    Allergies  Allergen Reactions   Nsaids Other (See Comments)    Due to chronic kidney disease, pt does not take any celebrex, motrin, aleve etc.   Aspirin Other (See Comments)    Not supposed to take this   Estradiol Other (See Comments)    "Severe allergic reaction"   Statins Other (See Comments)    "Her nephrologist does not want her on statins unless her cholesterol is elevated."    Consultations: Orthopedic surgery   Procedures/Studies: DG Pelvis Portable  Result Date: 04/17/2022 CLINICAL DATA:  Postop hip arthroplasty EXAM: PORTABLE PELVIS 1-2 VIEWS COMPARISON:  04/15/2022 FINDINGS: Interval left hip replacement with intact hardware and normal alignment. Gas in the soft tissues consistent with recent surgery. Radiopaque material within the colon and rectum IMPRESSION: Status post left hip replacement with expected postsurgical change. Electronically Signed   By: Jasmine PangKim  Fujinaga M.D.   On: 04/17/2022 20:21   DG HIP UNILAT WITH PELVIS 1V LEFT  Result Date: 04/17/2022 CLINICAL DATA:  Intraoperative total left hip replacement. EXAM: DG HIP (WITH OR WITHOUT PELVIS) 1V*L* COMPARISON:  April 15, 2022 FINDINGS: Intraoperative images from total left hip arthroplasty demonstrate expected alignment of the orthopedic hardware. IMPRESSION: Intraoperative images from total left hip arthroplasty. Fluoroscopy time is recorded as 8 seconds. Electronically Signed   By: Ted Mcalpineobrinka  Dimitrova M.D.   On: 04/17/2022 18:58   DG C-Arm  1-60 Min-No Report  Result Date: 04/17/2022 Fluoroscopy was utilized by the requesting physician.  No radiographic interpretation.   DG HIP UNILAT WITH PELVIS 2-3 VIEWS LEFT  Result Date: 04/15/2022 CLINICAL DATA:  Larey SeatFell 3 weeks ago, pain EXAM: DG HIP (WITH OR WITHOUT PELVIS) 2-3V LEFT COMPARISON:  None Available. FINDINGS: Osseous demineralization. Displaced subcapital fracture LEFT femur. No dislocation. Degenerative disc and facet disease changes lower lumbar spine. Numerous radiopacities within stool. IMPRESSION: Displaced subcapital fracture LEFT femur. Ingested radiopacities of uncertain etiology within stool. Electronically Signed   By: Ulyses SouthwardMark  Boles M.D.   On: 04/15/2022 12:23   (Echo, Carotid, EGD, Colonoscopy, ERCP)    Subjective: No new complaints  Discharge Exam: Vitals:   04/19/22 2212 04/20/22 0534  BP: (!) 141/68 (!) 150/80  Pulse: (!) 106 (!) 104  Resp: 18 16  Temp: 97.9 F (36.6 C) (!) 97.4 F (36.3 C)  SpO2: 98% 100%   Vitals:   04/19/22 1326 04/19/22  1714 04/19/22 2212 04/20/22 0534  BP: (!) 88/72 128/67 (!) 141/68 (!) 150/80  Pulse: 99 (!) 105 (!) 106 (!) 104  Resp: 18 18 18 16   Temp: 98.6 F (37 C)  97.9 F (36.6 C) (!) 97.4 F (36.3 C)  TempSrc: Oral  Oral Oral  SpO2: 100% 99% 98% 100%  Weight:      Height:        General: Pt is alert, awake, not in acute distress Cardiovascular: RRR, S1/S2 +, no rubs, no gallops Respiratory: CTA bilaterally, no wheezing, no rhonchi Abdominal: Soft, NT, ND, bowel sounds + Extremities: no edema, no cyanosis    The results of significant diagnostics from this hospitalization (including imaging, microbiology, ancillary and laboratory) are listed below for reference.     Microbiology: Recent Results (from the past 240 hour(s))  Surgical pcr screen     Status: None   Collection Time: 04/17/22  2:30 PM   Specimen: Nasal Mucosa; Nasal Swab  Result Value Ref Range Status   MRSA, PCR NEGATIVE NEGATIVE Final    Staphylococcus aureus NEGATIVE NEGATIVE Final    Comment: (NOTE) The Xpert SA Assay (FDA approved for NASAL specimens in patients 77 years of age and older), is one component of a comprehensive surveillance program. It is not intended to diagnose infection nor to guide or monitor treatment. Performed at [facility], [address] [place], [place] [address]      Labs: BNP (last 3 results) No results for input(s): "BNP" in the last 8760 hours. Basic Metabolic Panel: Recent Labs  Lab 04/17/22 1156 04/18/22 0333  NA 137 136  K 3.9 3.8  CL 102 105  CO2 26 25  GLUCOSE 219* 132*  BUN 33* 29*  CREATININE 1.12* 1.01*  CALCIUM 10.1 8.6*   Liver Function Tests: No results for input(s): "AST", "ALT", "ALKPHOS", "BILITOT", "PROT", "ALBUMIN" in the last 168 hours. No results for input(s): "LIPASE", "AMYLASE" in the last 168 hours. No results for input(s): "AMMONIA" in the last 168 hours. CBC: Recent Labs  Lab 04/17/22 1156 04/18/22 0333 04/19/22 0321  WBC 9.2 7.1 7.0  NEUTROABS 8.0*  --   --   HGB 13.3 9.6* 9.1*  HCT 39.9 29.2* 28.0*  MCV 89.9 90.7 89.7  PLT 212 117* 131*   Cardiac Enzymes: No results for input(s): "CKTOTAL", "CKMB", "CKMBINDEX", "TROPONINI" in the last 168 hours. BNP: Invalid input(s): "POCBNP" CBG: Recent Labs  Lab 04/19/22 0726 04/19/22 1112 04/19/22 1606 04/19/22 2209 04/20/22 0751  GLUCAP 148* 130* 205* 101* 113*   D-Dimer No results for input(s): "DDIMER" in the last 72 hours. Hgb A1c Recent Labs    04/17/22 1211 04/18/22 0937  HGBA1C 6.7* 6.7*   Lipid Profile No results for input(s): "CHOL", "HDL", "LDLCALC", "TRIG", "CHOLHDL", "LDLDIRECT" in the last 72 hours. Thyroid function studies No results for input(s): "TSH", "T4TOTAL", "T3FREE", "THYROIDAB" in the last 72 hours.  Invalid input(s): "FREET3" Anemia work up No results for input(s): "VITAMINB12", "FOLATE", "FERRITIN", "TIBC", "IRON", "RETICCTPCT" in the  last 72 hours. Urinalysis    Component Value Date/Time   COLORURINE YELLOW 06/04/2018 0822   APPEARANCEUR HAZY (A) 06/04/2018 0822   LABSPEC 1.009 06/04/2018 0822   PHURINE 5.0 06/04/2018 0822   GLUCOSEU NEGATIVE 06/04/2018 0822   HGBUR MODERATE (A) 06/04/2018 0822   BILIRUBINUR NEGATIVE 06/04/2018 0822   KETONESUR NEGATIVE 06/04/2018 0822   PROTEINUR NEGATIVE 06/04/2018 0822   NITRITE NEGATIVE 06/04/2018 0822   LEUKOCYTESUR LARGE (A) 06/04/2018 [id number]  Sepsis Labs Recent Labs  Lab 04/17/22 1156 04/18/22 0333 04/19/22 0321  WBC 9.2 7.1 7.0   Microbiology Recent Results (from the past 240 hour(s))  Surgical pcr screen     Status: None   Collection Time: 04/17/22  2:30 PM   Specimen: Nasal Mucosa; Nasal Swab  Result Value Ref Range Status   MRSA, PCR NEGATIVE NEGATIVE Final   Staphylococcus aureus NEGATIVE NEGATIVE Final    Comment: (NOTE) The Xpert SA Assay (FDA approved for NASAL specimens in patients 45 years of age and older), is one component of a comprehensive surveillance program. It is not intended to diagnose infection nor to guide or monitor treatment. Performed at [facility], [address], [place], [place] [address]      SIGNED:   Marinda Elk, MD  [facility] 04/20/2022, 8:22 AM Pager   If 7PM-7AM, please contact night-coverage [email or web address] Password [email or web address]

## 2022-04-19 NOTE — TOC Progression Note (Signed)
Transition of Care [facility]) - Progression Note   Patient Details  Name: ELSIA WOLFREY MRN: [id number] Date of Birth: [birth year]  [facility]) CM/SW Contact  Ewing Schlein, LCSW Phone Number: 04/19/2022, 3:34 PM  Clinical Narrative: CSW provided daughters with bed choices and after visiting some facilities, [facility]. CSW confirmed bed with [facility] in admissions. CSW started insurance authorization on [employer] portal, but it is pending. Reference ID # is: [id number]. CSW updated daughter, Marylene Land, and hospitalist regarding insurance delay. TOC awaiting insurance approval.  Expected Discharge Plan: Skilled Nursing Facility Barriers to Discharge: SNF Pending bed offer, Insurance Authorization, Continued Medical Work up  Expected Discharge Plan and Services In-house Referral: Clinical Social Work Post Acute Care Choice: Skilled Nursing Facility Living arrangements for the past 2 months: Single Family Home Expected Discharge Date: 04/19/22               DME Arranged: N/A DME Agency: NA  Social Determinants of Health (SDOH) Interventions SDOH Screenings   Food Insecurity: No Food Insecurity (04/17/2022)  Housing: Low Risk  (04/17/2022)  Transportation Needs: No Transportation Needs (04/17/2022)  Utilities: Not At Risk (04/17/2022)  Tobacco Use: Low Risk  (04/18/2022)   Readmission Risk Interventions     No data to display

## 2022-04-19 NOTE — Progress Notes (Signed)
Physical Therapy Treatment Patient Details Name: Jamie Macdonald MRN: [id number] DOB: [birth year] Today's Date: 04/19/2022   History of Present Illness Jamie Macdonald is a 77 y.o. female s/p L THA AA 4/3 for L femur fracture. PMH: CKD, HTN, diabetes    PT Comments    Pt continues cooperative but with increased confusion noted this pm.  Pt up to ambulate in hall and assisted to bed but with noted increased difficulty processing and following cues - nursing aware.   Recommendations for follow up therapy are one component of a multi-disciplinary discharge planning process, led by the attending physician.  Recommendations may be updated based on patient status, additional functional criteria and insurance authorization.  Follow Up Recommendations  Can patient physically be transported by private vehicle: Yes    Assistance Recommended at Discharge Frequent or constant Supervision/Assistance  Patient can return home with the following A little help with walking and/or transfers;A little help with bathing/dressing/bathroom;Assistance with cooking/housework;Assist for transportation   Equipment Recommendations  Rolling walker (2 wheels)    Recommendations for Other Services       Precautions / Restrictions Precautions Precautions: Fall Restrictions Weight Bearing Restrictions: No Other Position/Activity Restrictions: WBAT     Mobility  Bed Mobility Overal bed mobility: Needs Assistance Bed Mobility: Sit to Supine     Supine to sit: Min assist Sit to supine: Min assist, Mod assist   General bed mobility comments: cues for sequence with assist for BIL LEs    Transfers Overall transfer level: Needs assistance Equipment used: Rolling walker (2 wheels) Transfers: Sit to/from Stand Sit to Stand: Min guard           General transfer comment: cues for LE management and use of UEs to self assist    Ambulation/Gait Ambulation/Gait assistance: Min assist, Min guard Gait Distance  (Feet): 165 Feet Assistive device: Rolling walker (2 wheels) Gait Pattern/deviations: Decreased stride length, Narrow base of support, Step-to pattern, Step-through pattern, Antalgic Gait velocity: decreased     General Gait Details: cues for posture, position from RW, initial sequence and to increase BOS   Stairs             Wheelchair Mobility    Modified Rankin (Stroke Patients Only)       Balance Overall balance assessment: Needs assistance Sitting-balance support: Feet supported, No upper extremity supported Sitting balance-Leahy Scale: Good     Standing balance support: Reliant on assistive device for balance, During functional activity, Bilateral upper extremity supported Standing balance-Leahy Scale: Poor                              Cognition Arousal/Alertness: Awake/alert Behavior During Therapy: Anxious Overall Cognitive Status: Within Functional Limits for tasks assessed                                 General Comments: Increasing confusion this pm        Exercises Total Joint Exercises Ankle Circles/Pumps: AROM, Both, 15 reps, Supine Quad Sets: AROM, Both, 10 reps, Supine Heel Slides: AAROM, Left, 20 reps, Supine Hip ABduction/ADduction: AAROM, Left, 15 reps, Supine    General Comments        Pertinent Vitals/Pain Pain Assessment Pain Assessment: 0-10 Pain Score: 3  Pain Location: L hip Pain Descriptors / Indicators: Sore, Tender Pain Intervention(s): Limited activity within patient's tolerance    Home Living  Prior Function            PT Goals (current goals can now be found in the care plan section) Acute Rehab PT Goals Patient Stated Goal: regain independence PT Goal Formulation: With patient/family Time For Goal Achievement: 05/02/22 Potential to Achieve Goals: Good Progress towards PT goals: Progressing toward goals    Frequency    Min 3X/week      PT  Plan Current plan remains appropriate    Co-evaluation              AM-PAC PT "6 Clicks" Mobility   Outcome Measure  Help needed turning from your back to your side while in a flat bed without using bedrails?: A Little Help needed moving from lying on your back to sitting on the side of a flat bed without using bedrails?: A Little Help needed moving to and from a bed to a chair (including a wheelchair)?: A Little Help needed standing up from a chair using your arms (e.g., wheelchair or bedside chair)?: A Little Help needed to walk in hospital room?: A Little Help needed climbing 3-5 steps with a railing? : A Lot 6 Click Score: 17    End of Session Equipment Utilized During Treatment: Gait belt Activity Tolerance: Patient tolerated treatment well;Other (comment) Patient left: in bed;with call bell/phone within reach;with bed alarm set Nurse Communication: Mobility status PT Visit Diagnosis: Other abnormalities of gait and mobility (R26.89);Muscle weakness (generalized) (M62.81);Pain Pain - Right/Left: Left Pain - part of body: Hip     Time: [id number] PT Time Calculation (min) (ACUTE ONLY): 19 min  Charges:  $Gait Training: 8-22 mins $Therapeutic Exercise: 8-22 mins                     Mauro KaufmannHunter Mattalyn Anderegg PT Acute Rehabilitation Services Pager [phone] Office [phone]    Shirrell Solinger 04/19/2022, 2:29 PM

## 2022-04-20 DIAGNOSIS — Z471 Aftercare following joint replacement surgery: Secondary | ICD-10-CM | POA: Diagnosis not present

## 2022-04-20 DIAGNOSIS — W19XXXA Unspecified fall, initial encounter: Secondary | ICD-10-CM | POA: Diagnosis not present

## 2022-04-20 DIAGNOSIS — R269 Unspecified abnormalities of gait and mobility: Secondary | ICD-10-CM | POA: Diagnosis not present

## 2022-04-20 DIAGNOSIS — E119 Type 2 diabetes mellitus without complications: Secondary | ICD-10-CM | POA: Diagnosis not present

## 2022-04-20 DIAGNOSIS — S72355D Nondisplaced comminuted fracture of shaft of left femur, subsequent encounter for closed fracture with routine healing: Secondary | ICD-10-CM | POA: Diagnosis not present

## 2022-04-20 DIAGNOSIS — M25552 Pain in left hip: Secondary | ICD-10-CM | POA: Diagnosis not present

## 2022-04-20 DIAGNOSIS — S728X2A Other fracture of left femur, initial encounter for closed fracture: Secondary | ICD-10-CM | POA: Diagnosis not present

## 2022-04-20 DIAGNOSIS — Z4789 Encounter for other orthopedic aftercare: Secondary | ICD-10-CM | POA: Diagnosis not present

## 2022-04-20 DIAGNOSIS — Z96642 Presence of left artificial hip joint: Secondary | ICD-10-CM | POA: Diagnosis not present

## 2022-04-20 DIAGNOSIS — S728X2D Other fracture of left femur, subsequent encounter for closed fracture with routine healing: Secondary | ICD-10-CM | POA: Diagnosis not present

## 2022-04-20 DIAGNOSIS — Z89622 Acquired absence of left hip joint: Secondary | ICD-10-CM | POA: Diagnosis not present

## 2022-04-20 DIAGNOSIS — Z7401 Bed confinement status: Secondary | ICD-10-CM | POA: Diagnosis not present

## 2022-04-20 DIAGNOSIS — N1831 Chronic kidney disease, stage 3a: Secondary | ICD-10-CM | POA: Diagnosis not present

## 2022-04-20 DIAGNOSIS — S79929A Unspecified injury of unspecified thigh, initial encounter: Secondary | ICD-10-CM | POA: Diagnosis not present

## 2022-04-20 DIAGNOSIS — F4320 Adjustment disorder, unspecified: Secondary | ICD-10-CM | POA: Diagnosis not present

## 2022-04-20 DIAGNOSIS — I10 Essential (primary) hypertension: Secondary | ICD-10-CM | POA: Diagnosis not present

## 2022-04-20 LAB — GLUCOSE, CAPILLARY
Glucose-Capillary: 113 mg/dL — ABNORMAL HIGH (ref 70–99)
Glucose-Capillary: 139 mg/dL — ABNORMAL HIGH (ref 70–99)

## 2022-04-20 NOTE — Progress Notes (Signed)
Pt alert and oriented. Surgical dressing clean, dry and intact. PTAR at bedside to transport pt. Attempted to call for report but not answered.

## 2022-04-20 NOTE — Progress Notes (Signed)
Jamie Macdonald  MRN: [id number] DOB/Age: September 25, 1945 77 y.o. Physician: Lynnea Maizes, M.D. 3 Days Post-Op Procedure(s) (LRB): TOTAL HIP ARTHROPLASTY ANTERIOR APPROCH (Left)  Subjective: Patient resting comfortably in bed this a.m.  2 daughters at bedside.  No new complaints. Vital Signs Temp:  [97.4 F (36.3 C)-98.6 F (37 C)] 97.4 F (36.3 C) (04/06 0534) Pulse Rate:  [99-106] 104 (04/06 0534) Resp:  [16-18] 16 (04/06 0534) BP: (88-150)/(67-80) 150/80 (04/06 0534) SpO2:  [98 %-100 %] 100 % (04/06 0534)  Lab Results Recent Labs    04/18/22 0333 04/19/22 0321  WBC 7.1 7.0  HGB 9.6* 9.1*  HCT 29.2* 28.0*  PLT 117* 131*   BMET Recent Labs    04/17/22 1156 04/18/22 0333  NA 137 136  K 3.9 3.8  CL 102 105  CO2 26 25  GLUCOSE 219* 132*  BUN 33* 29*  CREATININE 1.12* 1.01*  CALCIUM 10.1 8.6*   INR  Date Value Ref Range Status  04/17/2022 1.1 0.8 - 1.2 Final    Comment:    (NOTE) INR goal varies based on device and disease states. Performed at [facility], [address] [place], [place] [address]      Exam  Left hip dressing clean and dry.  Resolving ecchymosis noted over posterior thigh as expected.  Compartments soft.  Neurovascular intact into the left lower extremity.  Impression:  Status post left THA for femoral neck fracture patient scheduled [facility].  Insurance approval in progress.  According to her daughter, she did receive a phone call last evening from [facility] stating that the approval had been received.  Will defer to clinical social worker and medicine service regarding formal discharge arrangements.  Family is questioning whether they can transfer patient by private vehicle and according to nursing staff this is a possibility.  Plan Discharge to SNF.  Orthopedic care per postop plan.  Follow-up with Dr. Charlann Boxer in approximately 2 weeks. Jamie Macdonald 04/20/2022, 10:09 AM   Contact #  [phone]

## 2022-04-20 NOTE — Progress Notes (Signed)
Physical Therapy Treatment Patient Details Name: Jamie Macdonald MRN: [id number] DOB: 1945-05-20 Today's Date: 04/20/2022   History of Present Illness Jamie Macdonald is a 77 y.o. female s/p L THA AA 4/3 for L femur fracture. PMH: CKD, HTN, diabetes    PT Comments    Pt continues very cooperative and progressing steadily with mobility including decreasing level of assist with most tasks and improved stability with ambulation.  Recommendations for follow up therapy are one component of a multi-disciplinary discharge planning process, led by the attending physician.  Recommendations may be updated based on patient status, additional functional criteria and insurance authorization.  Follow Up Recommendations  Can patient physically be transported by private vehicle: Yes    Assistance Recommended at Discharge Frequent or constant Supervision/Assistance  Patient can return home with the following A little help with walking and/or transfers;A little help with bathing/dressing/bathroom;Assistance with cooking/housework;Assist for transportation   Equipment Recommendations  Rolling walker (2 wheels)    Recommendations for Other Services       Precautions / Restrictions Precautions Precautions: Fall Restrictions Weight Bearing Restrictions: No Other Position/Activity Restrictions: WBAT     Mobility  Bed Mobility Overal bed mobility: Needs Assistance Bed Mobility: Supine to Sit     Supine to sit: Min assist, Mod assist     General bed mobility comments: increased time with cues for sequence and assist to manage L LE and to control trunk    Transfers Overall transfer level: Needs assistance Equipment used: Rolling walker (2 wheels) Transfers: Sit to/from Stand Sit to Stand: Min guard           General transfer comment: cues for LE management and use of UEs to self assist    Ambulation/Gait Ambulation/Gait assistance: Min guard Gait Distance (Feet): 160 Feet Assistive device:  Rolling walker (2 wheels) Gait Pattern/deviations: Decreased stride length, Narrow base of support, Step-to pattern, Step-through pattern, Antalgic Gait velocity: decreased     General Gait Details: cues for posture, position from RW, initial sequence and to increase BOS   Stairs             Wheelchair Mobility    Modified Rankin (Stroke Patients Only)       Balance Overall balance assessment: Needs assistance Sitting-balance support: Feet supported, No upper extremity supported Sitting balance-Leahy Scale: Good     Standing balance support: Single extremity supported Standing balance-Leahy Scale: Poor                              Cognition Arousal/Alertness: Awake/alert Behavior During Therapy: Anxious Overall Cognitive Status: Within Functional Limits for tasks assessed                                          Exercises Total Joint Exercises Ankle Circles/Pumps: AROM, Both, 15 reps, Supine Quad Sets: AROM, Both, 10 reps, Supine Heel Slides: AAROM, Left, 20 reps, Supine Hip ABduction/ADduction: AAROM, Left, 15 reps, Supine    General Comments        Pertinent Vitals/Pain Pain Assessment Pain Assessment: 0-10 Pain Score: 3  Pain Location: L hip Pain Descriptors / Indicators: Sore, Tender Pain Intervention(s): Limited activity within patient's tolerance, Monitored during session, Premedicated before session    Home Living  Prior Function            PT Goals (current goals can now be found in the care plan section) Acute Rehab PT Goals Patient Stated Goal: regain independence PT Goal Formulation: With patient/family Time For Goal Achievement: 05/02/22 Potential to Achieve Goals: Good Progress towards PT goals: Progressing toward goals    Frequency    Min 3X/week      PT Plan Current plan remains appropriate    Co-evaluation              AM-PAC PT "6 Clicks"  Mobility   Outcome Measure  Help needed turning from your back to your side while in a flat bed without using bedrails?: A Little Help needed moving from lying on your back to sitting on the side of a flat bed without using bedrails?: A Little Help needed moving to and from a bed to a chair (including a wheelchair)?: A Little Help needed standing up from a chair using your arms (e.g., wheelchair or bedside chair)?: A Little Help needed to walk in hospital room?: A Little Help needed climbing 3-5 steps with a railing? : A Lot 6 Click Score: 17    End of Session Equipment Utilized During Treatment: Gait belt Activity Tolerance: Patient tolerated treatment well Patient left: in chair;with call bell/phone within reach;with family/visitor present Nurse Communication: Mobility status PT Visit Diagnosis: Other abnormalities of gait and mobility (R26.89);Muscle weakness (generalized) (M62.81);Pain Pain - Right/Left: Left Pain - part of body: Hip     Time: [id number] PT Time Calculation (min) (ACUTE ONLY): 25 min  Charges:  $Gait Training: 8-22 mins $Therapeutic Exercise: 8-22 mins                     Mauro KaufmannHunter Agape Hardiman PT Acute Rehabilitation Services Pager [phone] Office [phone]    [facility] CenterBRADSHAW,Sonya Pucci 04/20/2022, 12:35 PM

## 2022-04-20 NOTE — TOC Transition Note (Addendum)
Transition of Care [facility]) - CM/SW Discharge Note   Patient Details  Name: Jamie Macdonald MRN: [id number] Date of Birth: 1945-03-30  Transition of Care [facility]) CM/SW Contact:  Adrian Prows, RN Phone Number: 04/20/2022, 11:39 AM   Clinical Narrative:    Contacted by [facility]; she says ins Berkley Harvey has been received; she gave RM # [phone], [facility], call report # [phone]; SNF transfer report and d/c summary sent via SNF Hub; pt's dtr Jamie Macdonald notified and agrees to d/c plan; pt transport by [facility]; [facility] called at [phone]; spoke operator # [phone]; no TOC needs.   Final next level of care: Skilled Nursing Facility Barriers to Discharge: No Barriers Identified   Patient Goals and CMS Choice CMS Medicare.gov Compare Post Acute Care list provided to:: Patient Represenative (must comment) Choice offered to / list presented to : Patient, Adult Children  Discharge Placement                [facility] Patient to be transferred to facility by: [facility] Name of family member notified: Jamie Macdonald (dtr) [phone] Patient and family notified of of transfer: 04/20/22  Discharge Plan and Services Additional resources added to the After Visit Summary for   In-house Referral: Clinical Social Work   Post Acute Care Choice: Skilled Nursing Facility          DME Arranged: N/A DME Agency: NA                  Social Determinants of Health (SDOH) Interventions SDOH Screenings   Food Insecurity: No Food Insecurity (04/17/2022)  Housing: Low Risk  (04/17/2022)  Transportation Needs: No Transportation Needs (04/17/2022)  Utilities: Not At Risk (04/17/2022)  Tobacco Use: Low Risk  (04/18/2022)     Readmission Risk Interventions    04/20/2022   11:35 AM  Readmission Risk Prevention Plan  Transportation Screening Complete  PCP or Specialist Appt within 3-5 Days Complete  HRI or Home Care Consult Complete  Social Work Consult for Recovery Care  Planning/Counseling Complete  Palliative Care Screening Not Applicable  Medication Review Oceanographer) Complete

## 2022-04-20 NOTE — Progress Notes (Signed)
Attempted to call [facility] twice for report. Call not answered. Will attempt later.

## 2022-04-20 NOTE — Progress Notes (Signed)
[facility] PROGRESS NOTE    Progress Note  Jamie Macdonald  XBM:[id number] DOB: 1945/11/05 DOA: 04/17/2022 PCP: Sigmund HazelMiller, Lisa, MD     Brief Narrative:   Jamie Macdonald is an 77 y.o. female past medical history significant for chronic kidney disease stage III, essential hypertension diabetes mellitus who came in for left femur fracture  Assessment/Plan:   Other fracture of left femur, initial encounter for closed fracture Orthopedic surgery was consulted, she status post left total hip arthroplasty on 04/17/2022. Analgesics and anticoagulation per orthopedic surgery. PT OT following the patient recommended skilled.   Essential hypertension Blood pressure is fairly controlled continue losartan.  Diabetes mellitus type 2 in nonobese Continue sliding scale insulin.  CBGs before meals and at bedtime check an A1c.  CKD (chronic kidney disease) stage IIIa: Creatinine appears to be at baseline.  Sacral decubitus ulcer stage II present on admission: RN Pressure Injury Documentation: Pressure Injury 04/17/22 Anus Mid;Right;Left Stage 2 -  Partial thickness loss of dermis presenting as a shallow open injury with a red, pink wound bed without slough. red, light bleeding, anus area where patient wipes (Active)  04/17/22 1430  Location: Anus  Location Orientation: Mid;Right;Left  Staging: Stage 2 -  Partial thickness loss of dermis presenting as a shallow open injury with a red, pink wound bed without slough.  Wound Description (Comments): red, light bleeding, anus area where patient wipes  Present on Admission:   Dressing Type Foam - Lift dressing to assess site every shift 04/19/22 2040    DVT prophylaxis: lovenox Family Communication:daughter Status is: Inpatient Remains inpatient appropriate because: Acute left hip fracture    Code Status:     Code Status Orders  (From admission, onward)           Start     Ordered   04/17/22 1339  Full code  Continuous       Question:   By:  Answer:  Consent: discussion documented in EHR   04/17/22 1339           Code Status History     Date Active Date Inactive Code Status Order ID Comments User Context   03/19/2018 1531 03/23/2018 1404 Full Code [id number]  Antionette CharJackson-Moore, Lisa, MD Inpatient   01/30/2018 0804 01/30/2018 1154 Full Code [id number]  Myna Hidalgozan, Jennifer, DO Inpatient      Advance Directive Documentation    Flowsheet Row Most Recent Value  Type of Advance Directive Healthcare Power of Attorney, Living will  Pre-existing out of facility DNR order (yellow form or pink MOST form) --  "MOST" Form in Place? --         IV Access:   Peripheral IV   Procedures and diagnostic studies:   No results found.   Medical Consultants:   None.   Subjective:    Jamie Macdonald no complaints  Objective:    Vitals:   04/19/22 1326 04/19/22 1714 04/19/22 2212 04/20/22 0534  BP: (!) 88/72 128/67 (!) 141/68 (!) 150/80  Pulse: 99 (!) 105 (!) 106 (!) 104  Resp: 18 18 18 16   Temp: 98.6 F (37 C)  97.9 F (36.6 C) (!) 97.4 F (36.3 C)  TempSrc: Oral  Oral Oral  SpO2: 100% 99% 98% 100%  Weight:      Height:       SpO2: 100 % O2 Flow Rate (L/min): 2 L/min   Intake/Output Summary (Last 24 hours) at 04/20/2022 0816 Last data filed at 04/19/2022 2200 Gross per 24 hour  Intake 1514.43 ml  Output --  Net 1514.43 ml    Filed Weights   04/17/22 1204  Weight: 49.9 kg    Exam: General exam: In no acute distress. Respiratory system: Good air movement and clear to auscultation. Cardiovascular system: S1 & S2 heard, RRR. No JVD. Gastrointestinal system: Abdomen is nondistended, soft and nontender.  Extremities: No pedal edema. Skin: No rashes, lesions or ulcers Psychiatry: Judgement and insight appear normal. Mood & affect appropriate.    Data Reviewed:    Labs: Basic Metabolic Panel: Recent Labs  Lab 04/17/22 1156 04/18/22 0333  NA 137 136  K 3.9 3.8  CL 102 105  CO2 26 25  GLUCOSE 219* 132*   BUN 33* 29*  CREATININE 1.12* 1.01*  CALCIUM 10.1 8.6*    GFR Estimated Creatinine Clearance: 33.3 mL/min (A) (by C-G formula based on SCr of 1.01 mg/dL (H)). Liver Function Tests: No results for input(s): "AST", "ALT", "ALKPHOS", "BILITOT", "PROT", "ALBUMIN" in the last 168 hours. No results for input(s): "LIPASE", "AMYLASE" in the last 168 hours. No results for input(s): "AMMONIA" in the last 168 hours. Coagulation profile Recent Labs  Lab 04/17/22 1156  INR 1.1    COVID-19 Labs  No results for input(s): "DDIMER", "FERRITIN", "LDH", "CRP" in the last 72 hours.  No results found for: "SARSCOV2NAA"  CBC: Recent Labs  Lab 04/17/22 1156 04/18/22 0333 04/19/22 0321  WBC 9.2 7.1 7.0  NEUTROABS 8.0*  --   --   HGB 13.3 9.6* 9.1*  HCT 39.9 29.2* 28.0*  MCV 89.9 90.7 89.7  PLT 212 117* 131*    Cardiac Enzymes: No results for input(s): "CKTOTAL", "CKMB", "CKMBINDEX", "TROPONINI" in the last 168 hours. BNP (last 3 results) No results for input(s): "PROBNP" in the last 8760 hours. CBG: Recent Labs  Lab 04/19/22 0726 04/19/22 1112 04/19/22 1606 04/19/22 2209 04/20/22 0751  GLUCAP 148* 130* 205* 101* 113*    D-Dimer: No results for input(s): "DDIMER" in the last 72 hours. Hgb A1c: Recent Labs    04/17/22 1211 04/18/22 0937  HGBA1C 6.7* 6.7*    Lipid Profile: No results for input(s): "CHOL", "HDL", "LDLCALC", "TRIG", "CHOLHDL", "LDLDIRECT" in the last 72 hours. Thyroid function studies: No results for input(s): "TSH", "T4TOTAL", "T3FREE", "THYROIDAB" in the last 72 hours.  Invalid input(s): "FREET3" Anemia work up: No results for input(s): "VITAMINB12", "FOLATE", "FERRITIN", "TIBC", "IRON", "RETICCTPCT" in the last 72 hours. Sepsis Labs: Recent Labs  Lab 04/17/22 1156 04/18/22 0333 04/19/22 0321  WBC 9.2 7.1 7.0    Microbiology Recent Results (from the past 240 hour(s))  Surgical pcr screen     Status: None   Collection Time: 04/17/22  2:30 PM    Specimen: Nasal Mucosa; Nasal Swab  Result Value Ref Range Status   MRSA, PCR NEGATIVE NEGATIVE Final   Staphylococcus aureus NEGATIVE NEGATIVE Final    Comment: (NOTE) The Xpert SA Assay (FDA approved for NASAL specimens in patients 77 years of age and older), is one component of a comprehensive surveillance program. It is not intended to diagnose infection nor to guide or monitor treatment. Performed at [facility], [address] [place], [place] [id number]      Medications:    docusate sodium  100 mg Oral BID   enoxaparin (LOVENOX) injection  30 mg Subcutaneous Q24H   insulin aspart  0-5 Units Subcutaneous QHS   insulin aspart  0-5 Units Subcutaneous QHS   insulin aspart  0-9 Units Subcutaneous TID WC  insulin aspart  3 Units Subcutaneous TID WC   losartan  50 mg Oral Daily   polyethylene glycol  17 g Oral BID   Continuous Infusions:  sodium chloride Stopped (04/19/22 1717)   methocarbamol (ROBAXIN) IV        LOS: 3 days   Marinda Elk  [facility]  04/20/2022, 8:16 AM

## 2022-04-20 NOTE — Discharge Summary (Signed)
Physician Discharge Summary  Jamie MatteLois A Macdonald ZOX:[id number] DOB: 1945-12-04 DOA: 04/17/2022  PCP: Sigmund HazelMiller, Lisa, MD  Admit date: 04/17/2022 Discharge date: 04/20/2022  Admitted From: Home Disposition:  SNF  Recommendations for Outpatient Follow-up:  Follow up with Ortho in 1-2 weeks Please obtain BMP/CBC in one week Reevaluate hemoglobin A1c in 3 months if blood glucose continues to be elevated she might benefit from metformin.   Home Health:No Equipment/Devices:None  Discharge Condition:Stable CODE STATUS:Full Diet recommendation: Heart Healthy  Brief/Interim Summary: 77 y.o. female past medical history significant for chronic kidney disease stage III, essential hypertension diabetes mellitus who came in for left femur fracture   Discharge Diagnoses:  Principal Problem:   Other fracture of left femur, initial encounter for closed fracture Active Problems:   Essential hypertension   Diabetes mellitus type 2 in nonobese   CKD (chronic kidney disease)   S/P total left hip arthroplasty   Pressure injury of skin  Acute left femur fracture, closed: Orthopedic surgery was consulted she status post left total hip replacement on 04/17/2022. Anticoagulation aspirin p.o. twice daily and analgesics per orthopedic surgery. PT evaluated the patient, she was transferred to a skilled nursing facility.  Essential hypertension: No changes to her blood pressure medication.  Diabetes mellitus type 2: A1c of 6.7, continue diet. Will have to be reevaluated by PCP in 4 to 6 weeks. She required minimal insulin while in house.  Chronic kidney disease stage IIIa: Her creatinine appears to be at baseline.  Significant results stage II present on admission: Noted.    Discharge Instructions  Discharge Instructions     Diet - low sodium heart healthy   Complete by: As directed    Diet - low sodium heart healthy   Complete by: As directed    Increase activity slowly   Complete by: As directed     Increase activity slowly   Complete by: As directed    No wound care   Complete by: As directed    No wound care   Complete by: As directed       Allergies as of 04/20/2022       Reactions   Nsaids Other (See Comments)   Due to chronic kidney disease, pt does not take any celebrex, motrin, aleve etc.   Aspirin Other (See Comments)   Not supposed to take this   Estradiol Other (See Comments)   "Severe allergic reaction"   Statins Other (See Comments)   "Her nephrologist does not want her on statins unless her cholesterol is elevated."        Medication List     TAKE these medications    Accu-Chek Aviva Plus test strip Generic drug: glucose blood   Accu-Chek Aviva Plus w/Device Kit   Accu-Chek Softclix Lancets lancets   Alive Diabetic Multivitamin Tabs Take 1-2 tablets by mouth daily with breakfast.   aspirin 81 MG chewable tablet Commonly known as: Aspirin Childrens Chew 1 tablet (81 mg total) by mouth in the morning and at bedtime for 28 days.   bismuth subsalicylate 262 MG chewable tablet Commonly known as: PEPTO BISMOL Chew 524 mg by mouth every 6 (six) hours as needed for indigestion.   HYDROcodone-acetaminophen 5-325 MG tablet Commonly known as: NORCO/VICODIN Take 1-2 tablets by mouth every 4 (four) hours as needed for up to 3 days for moderate pain or severe pain.   losartan 50 MG tablet Commonly known as: COZAAR Take 50 mg by mouth daily.   methocarbamol 500 MG tablet Commonly  known as: ROBAXIN Take 1 tablet (500 mg total) by mouth every 8 (eight) hours as needed for muscle spasms.   polyethylene glycol 17 g packet Commonly known as: MIRALAX / GLYCOLAX Take 17 g by mouth 2 (two) times daily.   UNABLE TO FIND Place 1 suppository vaginally 2 (two) times daily. Med Name: Vitamin E Vaginal Suppositories from [facility]   vitamin E 180 MG (400 UNITS) capsule Take 400 Units by mouth daily.        Contact information for follow-up  providers     Durene Romanslin, Matthew, MD. Schedule an appointment as soon as possible for a visit in 2 week(s).   Specialty: Orthopedic Surgery Contact information: [address] [place] [address] [phone]              Contact information for after-discharge care     Destination     [facility] .   Service: Skilled Nursing Contact information: [address] [place] [id number] [phone]                    Allergies  Allergen Reactions   Nsaids Other (See Comments)    Due to chronic kidney disease, pt does not take any celebrex, motrin, aleve etc.   Aspirin Other (See Comments)    Not supposed to take this   Estradiol Other (See Comments)    "Severe allergic reaction"   Statins Other (See Comments)    "Her nephrologist does not want her on statins unless her cholesterol is elevated."    Consultations: Orthopedic surgery   Procedures/Studies: DG Pelvis Portable  Result Date: 04/17/2022 CLINICAL DATA:  Postop hip arthroplasty EXAM: PORTABLE PELVIS 1-2 VIEWS COMPARISON:  04/15/2022 FINDINGS: Interval left hip replacement with intact hardware and normal alignment. Gas in the soft tissues consistent with recent surgery. Radiopaque material within the colon and rectum IMPRESSION: Status post left hip replacement with expected postsurgical change. Electronically Signed   By: Jasmine PangKim  Fujinaga M.D.   On: 04/17/2022 20:21   DG HIP UNILAT WITH PELVIS 1V LEFT  Result Date: 04/17/2022 CLINICAL DATA:  Intraoperative total left hip replacement. EXAM: DG HIP (WITH OR WITHOUT PELVIS) 1V*L* COMPARISON:  April 15, 2022 FINDINGS: Intraoperative images from total left hip arthroplasty demonstrate expected alignment of the orthopedic hardware. IMPRESSION: Intraoperative images from total left hip arthroplasty. Fluoroscopy time is recorded as 8 seconds. Electronically Signed   By: Ted Mcalpineobrinka  Dimitrova M.D.   On: 04/17/2022 18:58   DG C-Arm  1-60 Min-No Report  Result Date: 04/17/2022 Fluoroscopy was utilized by the requesting physician.  No radiographic interpretation.   DG HIP UNILAT WITH PELVIS 2-3 VIEWS LEFT  Result Date: 04/15/2022 CLINICAL DATA:  Larey SeatFell 3 weeks ago, pain EXAM: DG HIP (WITH OR WITHOUT PELVIS) 2-3V LEFT COMPARISON:  None Available. FINDINGS: Osseous demineralization. Displaced subcapital fracture LEFT femur. No dislocation. Degenerative disc and facet disease changes lower lumbar spine. Numerous radiopacities within stool. IMPRESSION: Displaced subcapital fracture LEFT femur. Ingested radiopacities of uncertain etiology within stool. Electronically Signed   By: Ulyses SouthwardMark  Boles M.D.   On: 04/15/2022 12:23   (Echo, Carotid, EGD, Colonoscopy, ERCP)    Subjective: No new complaints  Discharge Exam: Vitals:   04/19/22 2212 04/20/22 0534  BP: (!) 141/68 (!) 150/80  Pulse: (!) 106 (!) 104  Resp: 18 16  Temp: 97.9 F (36.6 C) (!) 97.4 F (36.3 C)  SpO2: 98% 100%   Vitals:   04/19/22 1326 04/19/22  1714 04/19/22 2212 04/20/22 0534  BP: (!) 88/72 128/67 (!) 141/68 (!) 150/80  Pulse: 99 (!) 105 (!) 106 (!) 104  Resp: 18 18 18 16   Temp: 98.6 F (37 C)  97.9 F (36.6 C) (!) 97.4 F (36.3 C)  TempSrc: Oral  Oral Oral  SpO2: 100% 99% 98% 100%  Weight:      Height:        General: Pt is alert, awake, not in acute distress Cardiovascular: RRR, S1/S2 +, no rubs, no gallops Respiratory: CTA bilaterally, no wheezing, no rhonchi Abdominal: Soft, NT, ND, bowel sounds + Extremities: no edema, no cyanosis    The results of significant diagnostics from this hospitalization (including imaging, microbiology, ancillary and laboratory) are listed below for reference.     Microbiology: Recent Results (from the past 240 hour(s))  Surgical pcr screen     Status: None   Collection Time: 04/17/22  2:30 PM   Specimen: Nasal Mucosa; Nasal Swab  Result Value Ref Range Status   MRSA, PCR NEGATIVE NEGATIVE Final    Staphylococcus aureus NEGATIVE NEGATIVE Final    Comment: (NOTE) The Xpert SA Assay (FDA approved for NASAL specimens in patients 77 years of age and older), is one component of a comprehensive surveillance program. It is not intended to diagnose infection nor to guide or monitor treatment. Performed at [facility], [address] [place], [place] [address]      Labs: BNP (last 3 results) No results for input(s): "BNP" in the last 8760 hours. Basic Metabolic Panel: Recent Labs  Lab 04/17/22 1156 04/18/22 0333  NA 137 136  K 3.9 3.8  CL 102 105  CO2 26 25  GLUCOSE 219* 132*  BUN 33* 29*  CREATININE 1.12* 1.01*  CALCIUM 10.1 8.6*   Liver Function Tests: No results for input(s): "AST", "ALT", "ALKPHOS", "BILITOT", "PROT", "ALBUMIN" in the last 168 hours. No results for input(s): "LIPASE", "AMYLASE" in the last 168 hours. No results for input(s): "AMMONIA" in the last 168 hours. CBC: Recent Labs  Lab 04/17/22 1156 04/18/22 0333 04/19/22 0321  WBC 9.2 7.1 7.0  NEUTROABS 8.0*  --   --   HGB 13.3 9.6* 9.1*  HCT 39.9 29.2* 28.0*  MCV 89.9 90.7 89.7  PLT 212 117* 131*   Cardiac Enzymes: No results for input(s): "CKTOTAL", "CKMB", "CKMBINDEX", "TROPONINI" in the last 168 hours. BNP: Invalid input(s): "POCBNP" CBG: Recent Labs  Lab 04/19/22 0726 04/19/22 1112 04/19/22 1606 04/19/22 2209 04/20/22 0751  GLUCAP 148* 130* 205* 101* 113*   D-Dimer No results for input(s): "DDIMER" in the last 72 hours. Hgb A1c Recent Labs    04/17/22 1211 04/18/22 0937  HGBA1C 6.7* 6.7*   Lipid Profile No results for input(s): "CHOL", "HDL", "LDLCALC", "TRIG", "CHOLHDL", "LDLDIRECT" in the last 72 hours. Thyroid function studies No results for input(s): "TSH", "T4TOTAL", "T3FREE", "THYROIDAB" in the last 72 hours.  Invalid input(s): "FREET3" Anemia work up No results for input(s): "VITAMINB12", "FOLATE", "FERRITIN", "TIBC", "IRON", "RETICCTPCT" in the  last 72 hours. Urinalysis    Component Value Date/Time   COLORURINE YELLOW 06/04/2018 0822   APPEARANCEUR HAZY (A) 06/04/2018 0822   LABSPEC 1.009 06/04/2018 0822   PHURINE 5.0 06/04/2018 0822   GLUCOSEU NEGATIVE 06/04/2018 0822   HGBUR MODERATE (A) 06/04/2018 0822   BILIRUBINUR NEGATIVE 06/04/2018 0822   KETONESUR NEGATIVE 06/04/2018 0822   PROTEINUR NEGATIVE 06/04/2018 0822   NITRITE NEGATIVE 06/04/2018 0822   LEUKOCYTESUR LARGE (A) 06/04/2018 [id number]  Sepsis Labs Recent Labs  Lab 04/17/22 1156 04/18/22 0333 04/19/22 0321  WBC 9.2 7.1 7.0   Microbiology Recent Results (from the past 240 hour(s))  Surgical pcr screen     Status: None   Collection Time: 04/17/22  2:30 PM   Specimen: Nasal Mucosa; Nasal Swab  Result Value Ref Range Status   MRSA, PCR NEGATIVE NEGATIVE Final   Staphylococcus aureus NEGATIVE NEGATIVE Final    Comment: (NOTE) The Xpert SA Assay (FDA approved for NASAL specimens in patients 54 years of age and older), is one component of a comprehensive surveillance program. It is not intended to diagnose infection nor to guide or monitor treatment. Performed at [facility], [address], [place], [place] [address]      SIGNED:   Marinda Elk, MD  [facility] 04/20/2022, 10:26 AM Pager   If 7PM-7AM, please contact night-coverage [email or web address] Password [email or web address]

## 2022-04-20 NOTE — Plan of Care (Signed)

## 2022-04-22 DIAGNOSIS — I10 Essential (primary) hypertension: Secondary | ICD-10-CM | POA: Diagnosis not present

## 2022-04-22 DIAGNOSIS — S72355D Nondisplaced comminuted fracture of shaft of left femur, subsequent encounter for closed fracture with routine healing: Secondary | ICD-10-CM | POA: Diagnosis not present

## 2022-04-22 DIAGNOSIS — N1831 Chronic kidney disease, stage 3a: Secondary | ICD-10-CM | POA: Diagnosis not present

## 2022-04-22 DIAGNOSIS — E119 Type 2 diabetes mellitus without complications: Secondary | ICD-10-CM | POA: Diagnosis not present

## 2022-04-23 DIAGNOSIS — I10 Essential (primary) hypertension: Secondary | ICD-10-CM | POA: Diagnosis not present

## 2022-04-23 DIAGNOSIS — E119 Type 2 diabetes mellitus without complications: Secondary | ICD-10-CM | POA: Diagnosis not present

## 2022-04-23 DIAGNOSIS — S72355D Nondisplaced comminuted fracture of shaft of left femur, subsequent encounter for closed fracture with routine healing: Secondary | ICD-10-CM | POA: Diagnosis not present

## 2022-04-23 DIAGNOSIS — N1831 Chronic kidney disease, stage 3a: Secondary | ICD-10-CM | POA: Diagnosis not present

## 2022-04-25 ENCOUNTER — Ambulatory Visit
Admission: RE | Admit: 2022-04-25 | Discharge: 2022-04-25 | Disposition: A | Discharge status: Home / Self Care | Payer: Medicare HMO | Source: Ambulatory Visit | Attending: Radiation Oncology | Admitting: Radiation Oncology

## 2022-04-29 DIAGNOSIS — E119 Type 2 diabetes mellitus without complications: Secondary | ICD-10-CM | POA: Diagnosis not present

## 2022-04-29 DIAGNOSIS — N1831 Chronic kidney disease, stage 3a: Secondary | ICD-10-CM | POA: Diagnosis not present

## 2022-04-29 DIAGNOSIS — S72355D Nondisplaced comminuted fracture of shaft of left femur, subsequent encounter for closed fracture with routine healing: Secondary | ICD-10-CM | POA: Diagnosis not present

## 2022-05-03 DIAGNOSIS — S72355D Nondisplaced comminuted fracture of shaft of left femur, subsequent encounter for closed fracture with routine healing: Secondary | ICD-10-CM | POA: Diagnosis not present

## 2022-05-03 DIAGNOSIS — I10 Essential (primary) hypertension: Secondary | ICD-10-CM | POA: Diagnosis not present

## 2022-05-03 DIAGNOSIS — N1831 Chronic kidney disease, stage 3a: Secondary | ICD-10-CM | POA: Diagnosis not present

## 2022-05-07 DIAGNOSIS — N1831 Chronic kidney disease, stage 3a: Secondary | ICD-10-CM | POA: Diagnosis not present

## 2022-05-07 DIAGNOSIS — Z556 Problems related to health literacy: Secondary | ICD-10-CM | POA: Diagnosis not present

## 2022-05-07 DIAGNOSIS — Z8541 Personal history of malignant neoplasm of cervix uteri: Secondary | ICD-10-CM | POA: Diagnosis not present

## 2022-05-07 DIAGNOSIS — Z9181 History of falling: Secondary | ICD-10-CM | POA: Diagnosis not present

## 2022-05-07 DIAGNOSIS — D631 Anemia in chronic kidney disease: Secondary | ICD-10-CM | POA: Diagnosis not present

## 2022-05-07 DIAGNOSIS — E1122 Type 2 diabetes mellitus with diabetic chronic kidney disease: Secondary | ICD-10-CM | POA: Diagnosis not present

## 2022-05-07 DIAGNOSIS — I129 Hypertensive chronic kidney disease with stage 1 through stage 4 chronic kidney disease, or unspecified chronic kidney disease: Secondary | ICD-10-CM | POA: Diagnosis not present

## 2022-05-07 DIAGNOSIS — S72355D Nondisplaced comminuted fracture of shaft of left femur, subsequent encounter for closed fracture with routine healing: Secondary | ICD-10-CM | POA: Diagnosis not present

## 2022-05-07 DIAGNOSIS — Z96642 Presence of left artificial hip joint: Secondary | ICD-10-CM | POA: Diagnosis not present

## 2022-05-08 DIAGNOSIS — Z9989 Dependence on other enabling machines and devices: Secondary | ICD-10-CM | POA: Diagnosis not present

## 2022-05-08 DIAGNOSIS — N1831 Chronic kidney disease, stage 3a: Secondary | ICD-10-CM | POA: Diagnosis not present

## 2022-05-08 DIAGNOSIS — N183 Chronic kidney disease, stage 3 unspecified: Secondary | ICD-10-CM | POA: Diagnosis not present

## 2022-05-08 DIAGNOSIS — E1136 Type 2 diabetes mellitus with diabetic cataract: Secondary | ICD-10-CM | POA: Diagnosis not present

## 2022-05-08 DIAGNOSIS — Z6823 Body mass index (BMI) 23.0-23.9, adult: Secondary | ICD-10-CM | POA: Diagnosis not present

## 2022-05-08 DIAGNOSIS — Z8541 Personal history of malignant neoplasm of cervix uteri: Secondary | ICD-10-CM | POA: Diagnosis not present

## 2022-05-08 DIAGNOSIS — M810 Age-related osteoporosis without current pathological fracture: Secondary | ICD-10-CM | POA: Diagnosis not present

## 2022-05-08 DIAGNOSIS — S72355D Nondisplaced comminuted fracture of shaft of left femur, subsequent encounter for closed fracture with routine healing: Secondary | ICD-10-CM | POA: Diagnosis not present

## 2022-05-08 DIAGNOSIS — R413 Other amnesia: Secondary | ICD-10-CM | POA: Diagnosis not present

## 2022-05-08 DIAGNOSIS — D631 Anemia in chronic kidney disease: Secondary | ICD-10-CM | POA: Diagnosis not present

## 2022-05-08 DIAGNOSIS — Z556 Problems related to health literacy: Secondary | ICD-10-CM | POA: Diagnosis not present

## 2022-05-08 DIAGNOSIS — Z96642 Presence of left artificial hip joint: Secondary | ICD-10-CM | POA: Diagnosis not present

## 2022-05-08 DIAGNOSIS — E1122 Type 2 diabetes mellitus with diabetic chronic kidney disease: Secondary | ICD-10-CM | POA: Diagnosis not present

## 2022-05-08 DIAGNOSIS — I129 Hypertensive chronic kidney disease with stage 1 through stage 4 chronic kidney disease, or unspecified chronic kidney disease: Secondary | ICD-10-CM | POA: Diagnosis not present

## 2022-05-08 DIAGNOSIS — Z9181 History of falling: Secondary | ICD-10-CM | POA: Diagnosis not present

## 2022-05-09 DIAGNOSIS — I129 Hypertensive chronic kidney disease with stage 1 through stage 4 chronic kidney disease, or unspecified chronic kidney disease: Secondary | ICD-10-CM | POA: Diagnosis not present

## 2022-05-09 DIAGNOSIS — Z556 Problems related to health literacy: Secondary | ICD-10-CM | POA: Diagnosis not present

## 2022-05-09 DIAGNOSIS — N1831 Chronic kidney disease, stage 3a: Secondary | ICD-10-CM | POA: Diagnosis not present

## 2022-05-09 DIAGNOSIS — Z8541 Personal history of malignant neoplasm of cervix uteri: Secondary | ICD-10-CM | POA: Diagnosis not present

## 2022-05-09 DIAGNOSIS — S72355D Nondisplaced comminuted fracture of shaft of left femur, subsequent encounter for closed fracture with routine healing: Secondary | ICD-10-CM | POA: Diagnosis not present

## 2022-05-09 DIAGNOSIS — Z4731 Aftercare following explantation of shoulder joint prosthesis: Secondary | ICD-10-CM | POA: Diagnosis not present

## 2022-05-09 DIAGNOSIS — D631 Anemia in chronic kidney disease: Secondary | ICD-10-CM | POA: Diagnosis not present

## 2022-05-09 DIAGNOSIS — Z9181 History of falling: Secondary | ICD-10-CM | POA: Diagnosis not present

## 2022-05-09 DIAGNOSIS — E1122 Type 2 diabetes mellitus with diabetic chronic kidney disease: Secondary | ICD-10-CM | POA: Diagnosis not present

## 2022-05-09 DIAGNOSIS — Z96642 Presence of left artificial hip joint: Secondary | ICD-10-CM | POA: Diagnosis not present

## 2022-05-10 DIAGNOSIS — Z96642 Presence of left artificial hip joint: Secondary | ICD-10-CM | POA: Diagnosis not present

## 2022-05-10 DIAGNOSIS — Z8541 Personal history of malignant neoplasm of cervix uteri: Secondary | ICD-10-CM | POA: Diagnosis not present

## 2022-05-10 DIAGNOSIS — E1122 Type 2 diabetes mellitus with diabetic chronic kidney disease: Secondary | ICD-10-CM | POA: Diagnosis not present

## 2022-05-10 DIAGNOSIS — S72355D Nondisplaced comminuted fracture of shaft of left femur, subsequent encounter for closed fracture with routine healing: Secondary | ICD-10-CM | POA: Diagnosis not present

## 2022-05-10 DIAGNOSIS — N1831 Chronic kidney disease, stage 3a: Secondary | ICD-10-CM | POA: Diagnosis not present

## 2022-05-10 DIAGNOSIS — Z9181 History of falling: Secondary | ICD-10-CM | POA: Diagnosis not present

## 2022-05-10 DIAGNOSIS — D631 Anemia in chronic kidney disease: Secondary | ICD-10-CM | POA: Diagnosis not present

## 2022-05-10 DIAGNOSIS — I129 Hypertensive chronic kidney disease with stage 1 through stage 4 chronic kidney disease, or unspecified chronic kidney disease: Secondary | ICD-10-CM | POA: Diagnosis not present

## 2022-05-10 DIAGNOSIS — Z556 Problems related to health literacy: Secondary | ICD-10-CM | POA: Diagnosis not present

## 2022-05-13 DIAGNOSIS — I129 Hypertensive chronic kidney disease with stage 1 through stage 4 chronic kidney disease, or unspecified chronic kidney disease: Secondary | ICD-10-CM | POA: Diagnosis not present

## 2022-05-13 DIAGNOSIS — E1122 Type 2 diabetes mellitus with diabetic chronic kidney disease: Secondary | ICD-10-CM | POA: Diagnosis not present

## 2022-05-13 DIAGNOSIS — S72355D Nondisplaced comminuted fracture of shaft of left femur, subsequent encounter for closed fracture with routine healing: Secondary | ICD-10-CM | POA: Diagnosis not present

## 2022-05-13 DIAGNOSIS — D631 Anemia in chronic kidney disease: Secondary | ICD-10-CM | POA: Diagnosis not present

## 2022-05-13 DIAGNOSIS — N1831 Chronic kidney disease, stage 3a: Secondary | ICD-10-CM | POA: Diagnosis not present

## 2022-05-13 DIAGNOSIS — Z556 Problems related to health literacy: Secondary | ICD-10-CM | POA: Diagnosis not present

## 2022-05-13 DIAGNOSIS — Z9181 History of falling: Secondary | ICD-10-CM | POA: Diagnosis not present

## 2022-05-13 DIAGNOSIS — Z8541 Personal history of malignant neoplasm of cervix uteri: Secondary | ICD-10-CM | POA: Diagnosis not present

## 2022-05-13 DIAGNOSIS — Z96642 Presence of left artificial hip joint: Secondary | ICD-10-CM | POA: Diagnosis not present

## 2022-05-15 DIAGNOSIS — N1831 Chronic kidney disease, stage 3a: Secondary | ICD-10-CM | POA: Diagnosis not present

## 2022-05-15 DIAGNOSIS — S72355D Nondisplaced comminuted fracture of shaft of left femur, subsequent encounter for closed fracture with routine healing: Secondary | ICD-10-CM | POA: Diagnosis not present

## 2022-05-15 DIAGNOSIS — E1122 Type 2 diabetes mellitus with diabetic chronic kidney disease: Secondary | ICD-10-CM | POA: Diagnosis not present

## 2022-05-15 DIAGNOSIS — Z96642 Presence of left artificial hip joint: Secondary | ICD-10-CM | POA: Diagnosis not present

## 2022-05-15 DIAGNOSIS — Z556 Problems related to health literacy: Secondary | ICD-10-CM | POA: Diagnosis not present

## 2022-05-15 DIAGNOSIS — D631 Anemia in chronic kidney disease: Secondary | ICD-10-CM | POA: Diagnosis not present

## 2022-05-15 DIAGNOSIS — Z9181 History of falling: Secondary | ICD-10-CM | POA: Diagnosis not present

## 2022-05-15 DIAGNOSIS — Z8541 Personal history of malignant neoplasm of cervix uteri: Secondary | ICD-10-CM | POA: Diagnosis not present

## 2022-05-15 DIAGNOSIS — I129 Hypertensive chronic kidney disease with stage 1 through stage 4 chronic kidney disease, or unspecified chronic kidney disease: Secondary | ICD-10-CM | POA: Diagnosis not present

## 2022-05-16 DIAGNOSIS — E1122 Type 2 diabetes mellitus with diabetic chronic kidney disease: Secondary | ICD-10-CM | POA: Diagnosis not present

## 2022-05-16 DIAGNOSIS — Z96642 Presence of left artificial hip joint: Secondary | ICD-10-CM | POA: Diagnosis not present

## 2022-05-16 DIAGNOSIS — Z556 Problems related to health literacy: Secondary | ICD-10-CM | POA: Diagnosis not present

## 2022-05-16 DIAGNOSIS — I129 Hypertensive chronic kidney disease with stage 1 through stage 4 chronic kidney disease, or unspecified chronic kidney disease: Secondary | ICD-10-CM | POA: Diagnosis not present

## 2022-05-16 DIAGNOSIS — S72355D Nondisplaced comminuted fracture of shaft of left femur, subsequent encounter for closed fracture with routine healing: Secondary | ICD-10-CM | POA: Diagnosis not present

## 2022-05-16 DIAGNOSIS — D631 Anemia in chronic kidney disease: Secondary | ICD-10-CM | POA: Diagnosis not present

## 2022-05-16 DIAGNOSIS — Z8541 Personal history of malignant neoplasm of cervix uteri: Secondary | ICD-10-CM | POA: Diagnosis not present

## 2022-05-16 DIAGNOSIS — N1831 Chronic kidney disease, stage 3a: Secondary | ICD-10-CM | POA: Diagnosis not present

## 2022-05-16 DIAGNOSIS — Z9181 History of falling: Secondary | ICD-10-CM | POA: Diagnosis not present

## 2022-05-17 DIAGNOSIS — Z9181 History of falling: Secondary | ICD-10-CM | POA: Diagnosis not present

## 2022-05-17 DIAGNOSIS — D631 Anemia in chronic kidney disease: Secondary | ICD-10-CM | POA: Diagnosis not present

## 2022-05-17 DIAGNOSIS — N1831 Chronic kidney disease, stage 3a: Secondary | ICD-10-CM | POA: Diagnosis not present

## 2022-05-17 DIAGNOSIS — Z556 Problems related to health literacy: Secondary | ICD-10-CM | POA: Diagnosis not present

## 2022-05-17 DIAGNOSIS — E1122 Type 2 diabetes mellitus with diabetic chronic kidney disease: Secondary | ICD-10-CM | POA: Diagnosis not present

## 2022-05-17 DIAGNOSIS — Z96642 Presence of left artificial hip joint: Secondary | ICD-10-CM | POA: Diagnosis not present

## 2022-05-17 DIAGNOSIS — Z8541 Personal history of malignant neoplasm of cervix uteri: Secondary | ICD-10-CM | POA: Diagnosis not present

## 2022-05-17 DIAGNOSIS — I129 Hypertensive chronic kidney disease with stage 1 through stage 4 chronic kidney disease, or unspecified chronic kidney disease: Secondary | ICD-10-CM | POA: Diagnosis not present

## 2022-05-17 DIAGNOSIS — S72355D Nondisplaced comminuted fracture of shaft of left femur, subsequent encounter for closed fracture with routine healing: Secondary | ICD-10-CM | POA: Diagnosis not present

## 2022-05-20 DIAGNOSIS — Z9181 History of falling: Secondary | ICD-10-CM | POA: Diagnosis not present

## 2022-05-20 DIAGNOSIS — I129 Hypertensive chronic kidney disease with stage 1 through stage 4 chronic kidney disease, or unspecified chronic kidney disease: Secondary | ICD-10-CM | POA: Diagnosis not present

## 2022-05-20 DIAGNOSIS — Z8541 Personal history of malignant neoplasm of cervix uteri: Secondary | ICD-10-CM | POA: Diagnosis not present

## 2022-05-20 DIAGNOSIS — Z556 Problems related to health literacy: Secondary | ICD-10-CM | POA: Diagnosis not present

## 2022-05-20 DIAGNOSIS — D631 Anemia in chronic kidney disease: Secondary | ICD-10-CM | POA: Diagnosis not present

## 2022-05-20 DIAGNOSIS — S72355D Nondisplaced comminuted fracture of shaft of left femur, subsequent encounter for closed fracture with routine healing: Secondary | ICD-10-CM | POA: Diagnosis not present

## 2022-05-20 DIAGNOSIS — Z96642 Presence of left artificial hip joint: Secondary | ICD-10-CM | POA: Diagnosis not present

## 2022-05-20 DIAGNOSIS — E1122 Type 2 diabetes mellitus with diabetic chronic kidney disease: Secondary | ICD-10-CM | POA: Diagnosis not present

## 2022-05-20 DIAGNOSIS — N1831 Chronic kidney disease, stage 3a: Secondary | ICD-10-CM | POA: Diagnosis not present

## 2022-05-21 DIAGNOSIS — N1831 Chronic kidney disease, stage 3a: Secondary | ICD-10-CM | POA: Diagnosis not present

## 2022-05-21 DIAGNOSIS — D631 Anemia in chronic kidney disease: Secondary | ICD-10-CM | POA: Diagnosis not present

## 2022-05-21 DIAGNOSIS — Z96642 Presence of left artificial hip joint: Secondary | ICD-10-CM | POA: Diagnosis not present

## 2022-05-21 DIAGNOSIS — Z9181 History of falling: Secondary | ICD-10-CM | POA: Diagnosis not present

## 2022-05-21 DIAGNOSIS — I129 Hypertensive chronic kidney disease with stage 1 through stage 4 chronic kidney disease, or unspecified chronic kidney disease: Secondary | ICD-10-CM | POA: Diagnosis not present

## 2022-05-21 DIAGNOSIS — S72355D Nondisplaced comminuted fracture of shaft of left femur, subsequent encounter for closed fracture with routine healing: Secondary | ICD-10-CM | POA: Diagnosis not present

## 2022-05-21 DIAGNOSIS — Z8541 Personal history of malignant neoplasm of cervix uteri: Secondary | ICD-10-CM | POA: Diagnosis not present

## 2022-05-21 DIAGNOSIS — E1122 Type 2 diabetes mellitus with diabetic chronic kidney disease: Secondary | ICD-10-CM | POA: Diagnosis not present

## 2022-05-21 DIAGNOSIS — Z556 Problems related to health literacy: Secondary | ICD-10-CM | POA: Diagnosis not present

## 2022-05-22 DIAGNOSIS — Z9181 History of falling: Secondary | ICD-10-CM | POA: Diagnosis not present

## 2022-05-22 DIAGNOSIS — S72355D Nondisplaced comminuted fracture of shaft of left femur, subsequent encounter for closed fracture with routine healing: Secondary | ICD-10-CM | POA: Diagnosis not present

## 2022-05-22 DIAGNOSIS — Z556 Problems related to health literacy: Secondary | ICD-10-CM | POA: Diagnosis not present

## 2022-05-22 DIAGNOSIS — E1122 Type 2 diabetes mellitus with diabetic chronic kidney disease: Secondary | ICD-10-CM | POA: Diagnosis not present

## 2022-05-22 DIAGNOSIS — D631 Anemia in chronic kidney disease: Secondary | ICD-10-CM | POA: Diagnosis not present

## 2022-05-22 DIAGNOSIS — Z96642 Presence of left artificial hip joint: Secondary | ICD-10-CM | POA: Diagnosis not present

## 2022-05-22 DIAGNOSIS — N1831 Chronic kidney disease, stage 3a: Secondary | ICD-10-CM | POA: Diagnosis not present

## 2022-05-22 DIAGNOSIS — Z8541 Personal history of malignant neoplasm of cervix uteri: Secondary | ICD-10-CM | POA: Diagnosis not present

## 2022-05-22 DIAGNOSIS — I129 Hypertensive chronic kidney disease with stage 1 through stage 4 chronic kidney disease, or unspecified chronic kidney disease: Secondary | ICD-10-CM | POA: Diagnosis not present

## 2022-05-27 DIAGNOSIS — Z96642 Presence of left artificial hip joint: Secondary | ICD-10-CM | POA: Diagnosis not present

## 2022-05-27 DIAGNOSIS — Z8541 Personal history of malignant neoplasm of cervix uteri: Secondary | ICD-10-CM | POA: Diagnosis not present

## 2022-05-27 DIAGNOSIS — S72355D Nondisplaced comminuted fracture of shaft of left femur, subsequent encounter for closed fracture with routine healing: Secondary | ICD-10-CM | POA: Diagnosis not present

## 2022-05-27 DIAGNOSIS — N1831 Chronic kidney disease, stage 3a: Secondary | ICD-10-CM | POA: Diagnosis not present

## 2022-05-27 DIAGNOSIS — Z556 Problems related to health literacy: Secondary | ICD-10-CM | POA: Diagnosis not present

## 2022-05-27 DIAGNOSIS — D631 Anemia in chronic kidney disease: Secondary | ICD-10-CM | POA: Diagnosis not present

## 2022-05-27 DIAGNOSIS — I129 Hypertensive chronic kidney disease with stage 1 through stage 4 chronic kidney disease, or unspecified chronic kidney disease: Secondary | ICD-10-CM | POA: Diagnosis not present

## 2022-05-27 DIAGNOSIS — E1122 Type 2 diabetes mellitus with diabetic chronic kidney disease: Secondary | ICD-10-CM | POA: Diagnosis not present

## 2022-05-27 DIAGNOSIS — Z9181 History of falling: Secondary | ICD-10-CM | POA: Diagnosis not present

## 2022-05-28 DIAGNOSIS — I129 Hypertensive chronic kidney disease with stage 1 through stage 4 chronic kidney disease, or unspecified chronic kidney disease: Secondary | ICD-10-CM | POA: Diagnosis not present

## 2022-05-28 DIAGNOSIS — Z8541 Personal history of malignant neoplasm of cervix uteri: Secondary | ICD-10-CM | POA: Diagnosis not present

## 2022-05-28 DIAGNOSIS — Z9181 History of falling: Secondary | ICD-10-CM | POA: Diagnosis not present

## 2022-05-28 DIAGNOSIS — Z96642 Presence of left artificial hip joint: Secondary | ICD-10-CM | POA: Diagnosis not present

## 2022-05-28 DIAGNOSIS — N1831 Chronic kidney disease, stage 3a: Secondary | ICD-10-CM | POA: Diagnosis not present

## 2022-05-28 DIAGNOSIS — D631 Anemia in chronic kidney disease: Secondary | ICD-10-CM | POA: Diagnosis not present

## 2022-05-28 DIAGNOSIS — S72355D Nondisplaced comminuted fracture of shaft of left femur, subsequent encounter for closed fracture with routine healing: Secondary | ICD-10-CM | POA: Diagnosis not present

## 2022-05-28 DIAGNOSIS — E1122 Type 2 diabetes mellitus with diabetic chronic kidney disease: Secondary | ICD-10-CM | POA: Diagnosis not present

## 2022-05-28 DIAGNOSIS — Z556 Problems related to health literacy: Secondary | ICD-10-CM | POA: Diagnosis not present

## 2022-05-31 DIAGNOSIS — D631 Anemia in chronic kidney disease: Secondary | ICD-10-CM | POA: Diagnosis not present

## 2022-05-31 DIAGNOSIS — Z8541 Personal history of malignant neoplasm of cervix uteri: Secondary | ICD-10-CM | POA: Diagnosis not present

## 2022-05-31 DIAGNOSIS — I129 Hypertensive chronic kidney disease with stage 1 through stage 4 chronic kidney disease, or unspecified chronic kidney disease: Secondary | ICD-10-CM | POA: Diagnosis not present

## 2022-05-31 DIAGNOSIS — Z96642 Presence of left artificial hip joint: Secondary | ICD-10-CM | POA: Diagnosis not present

## 2022-05-31 DIAGNOSIS — S72355D Nondisplaced comminuted fracture of shaft of left femur, subsequent encounter for closed fracture with routine healing: Secondary | ICD-10-CM | POA: Diagnosis not present

## 2022-05-31 DIAGNOSIS — Z9181 History of falling: Secondary | ICD-10-CM | POA: Diagnosis not present

## 2022-05-31 DIAGNOSIS — Z556 Problems related to health literacy: Secondary | ICD-10-CM | POA: Diagnosis not present

## 2022-05-31 DIAGNOSIS — N1831 Chronic kidney disease, stage 3a: Secondary | ICD-10-CM | POA: Diagnosis not present

## 2022-05-31 DIAGNOSIS — E1122 Type 2 diabetes mellitus with diabetic chronic kidney disease: Secondary | ICD-10-CM | POA: Diagnosis not present

## 2022-06-03 DIAGNOSIS — Z8541 Personal history of malignant neoplasm of cervix uteri: Secondary | ICD-10-CM | POA: Diagnosis not present

## 2022-06-03 DIAGNOSIS — S72355D Nondisplaced comminuted fracture of shaft of left femur, subsequent encounter for closed fracture with routine healing: Secondary | ICD-10-CM | POA: Diagnosis not present

## 2022-06-03 DIAGNOSIS — Z96642 Presence of left artificial hip joint: Secondary | ICD-10-CM | POA: Diagnosis not present

## 2022-06-03 DIAGNOSIS — I129 Hypertensive chronic kidney disease with stage 1 through stage 4 chronic kidney disease, or unspecified chronic kidney disease: Secondary | ICD-10-CM | POA: Diagnosis not present

## 2022-06-03 DIAGNOSIS — Z556 Problems related to health literacy: Secondary | ICD-10-CM | POA: Diagnosis not present

## 2022-06-03 DIAGNOSIS — Z9181 History of falling: Secondary | ICD-10-CM | POA: Diagnosis not present

## 2022-06-03 DIAGNOSIS — N1831 Chronic kidney disease, stage 3a: Secondary | ICD-10-CM | POA: Diagnosis not present

## 2022-06-03 DIAGNOSIS — E1122 Type 2 diabetes mellitus with diabetic chronic kidney disease: Secondary | ICD-10-CM | POA: Diagnosis not present

## 2022-06-03 DIAGNOSIS — D631 Anemia in chronic kidney disease: Secondary | ICD-10-CM | POA: Diagnosis not present

## 2022-06-04 DIAGNOSIS — E1122 Type 2 diabetes mellitus with diabetic chronic kidney disease: Secondary | ICD-10-CM | POA: Diagnosis not present

## 2022-06-04 DIAGNOSIS — Z96642 Presence of left artificial hip joint: Secondary | ICD-10-CM | POA: Diagnosis not present

## 2022-06-04 DIAGNOSIS — Z9181 History of falling: Secondary | ICD-10-CM | POA: Diagnosis not present

## 2022-06-04 DIAGNOSIS — Z8541 Personal history of malignant neoplasm of cervix uteri: Secondary | ICD-10-CM | POA: Diagnosis not present

## 2022-06-04 DIAGNOSIS — S72355D Nondisplaced comminuted fracture of shaft of left femur, subsequent encounter for closed fracture with routine healing: Secondary | ICD-10-CM | POA: Diagnosis not present

## 2022-06-04 DIAGNOSIS — Z556 Problems related to health literacy: Secondary | ICD-10-CM | POA: Diagnosis not present

## 2022-06-04 DIAGNOSIS — D631 Anemia in chronic kidney disease: Secondary | ICD-10-CM | POA: Diagnosis not present

## 2022-06-04 DIAGNOSIS — N1831 Chronic kidney disease, stage 3a: Secondary | ICD-10-CM | POA: Diagnosis not present

## 2022-06-04 DIAGNOSIS — I129 Hypertensive chronic kidney disease with stage 1 through stage 4 chronic kidney disease, or unspecified chronic kidney disease: Secondary | ICD-10-CM | POA: Diagnosis not present

## 2022-06-05 DIAGNOSIS — Z471 Aftercare following joint replacement surgery: Secondary | ICD-10-CM | POA: Diagnosis not present

## 2022-06-05 DIAGNOSIS — Z96642 Presence of left artificial hip joint: Secondary | ICD-10-CM | POA: Diagnosis not present

## 2022-06-12 DIAGNOSIS — S72355D Nondisplaced comminuted fracture of shaft of left femur, subsequent encounter for closed fracture with routine healing: Secondary | ICD-10-CM | POA: Diagnosis not present

## 2022-06-12 DIAGNOSIS — N1831 Chronic kidney disease, stage 3a: Secondary | ICD-10-CM | POA: Diagnosis not present

## 2022-06-12 DIAGNOSIS — Z556 Problems related to health literacy: Secondary | ICD-10-CM | POA: Diagnosis not present

## 2022-06-12 DIAGNOSIS — I129 Hypertensive chronic kidney disease with stage 1 through stage 4 chronic kidney disease, or unspecified chronic kidney disease: Secondary | ICD-10-CM | POA: Diagnosis not present

## 2022-06-12 DIAGNOSIS — Z96642 Presence of left artificial hip joint: Secondary | ICD-10-CM | POA: Diagnosis not present

## 2022-06-12 DIAGNOSIS — Z8541 Personal history of malignant neoplasm of cervix uteri: Secondary | ICD-10-CM | POA: Diagnosis not present

## 2022-06-12 DIAGNOSIS — D631 Anemia in chronic kidney disease: Secondary | ICD-10-CM | POA: Diagnosis not present

## 2022-06-12 DIAGNOSIS — E1122 Type 2 diabetes mellitus with diabetic chronic kidney disease: Secondary | ICD-10-CM | POA: Diagnosis not present

## 2022-06-12 DIAGNOSIS — Z9181 History of falling: Secondary | ICD-10-CM | POA: Diagnosis not present

## 2022-06-13 DIAGNOSIS — E1122 Type 2 diabetes mellitus with diabetic chronic kidney disease: Secondary | ICD-10-CM | POA: Diagnosis not present

## 2022-06-13 DIAGNOSIS — Z9181 History of falling: Secondary | ICD-10-CM | POA: Diagnosis not present

## 2022-06-13 DIAGNOSIS — Z556 Problems related to health literacy: Secondary | ICD-10-CM | POA: Diagnosis not present

## 2022-06-13 DIAGNOSIS — Z8541 Personal history of malignant neoplasm of cervix uteri: Secondary | ICD-10-CM | POA: Diagnosis not present

## 2022-06-13 DIAGNOSIS — I129 Hypertensive chronic kidney disease with stage 1 through stage 4 chronic kidney disease, or unspecified chronic kidney disease: Secondary | ICD-10-CM | POA: Diagnosis not present

## 2022-06-13 DIAGNOSIS — Z96642 Presence of left artificial hip joint: Secondary | ICD-10-CM | POA: Diagnosis not present

## 2022-06-13 DIAGNOSIS — N1831 Chronic kidney disease, stage 3a: Secondary | ICD-10-CM | POA: Diagnosis not present

## 2022-06-13 DIAGNOSIS — D631 Anemia in chronic kidney disease: Secondary | ICD-10-CM | POA: Diagnosis not present

## 2022-06-13 DIAGNOSIS — S72355D Nondisplaced comminuted fracture of shaft of left femur, subsequent encounter for closed fracture with routine healing: Secondary | ICD-10-CM | POA: Diagnosis not present

## 2022-06-17 DIAGNOSIS — Z556 Problems related to health literacy: Secondary | ICD-10-CM | POA: Diagnosis not present

## 2022-06-17 DIAGNOSIS — Z96642 Presence of left artificial hip joint: Secondary | ICD-10-CM | POA: Diagnosis not present

## 2022-06-17 DIAGNOSIS — S72355D Nondisplaced comminuted fracture of shaft of left femur, subsequent encounter for closed fracture with routine healing: Secondary | ICD-10-CM | POA: Diagnosis not present

## 2022-06-17 DIAGNOSIS — D631 Anemia in chronic kidney disease: Secondary | ICD-10-CM | POA: Diagnosis not present

## 2022-06-17 DIAGNOSIS — Z9181 History of falling: Secondary | ICD-10-CM | POA: Diagnosis not present

## 2022-06-17 DIAGNOSIS — N1831 Chronic kidney disease, stage 3a: Secondary | ICD-10-CM | POA: Diagnosis not present

## 2022-06-17 DIAGNOSIS — E1122 Type 2 diabetes mellitus with diabetic chronic kidney disease: Secondary | ICD-10-CM | POA: Diagnosis not present

## 2022-06-17 DIAGNOSIS — Z8541 Personal history of malignant neoplasm of cervix uteri: Secondary | ICD-10-CM | POA: Diagnosis not present

## 2022-06-17 DIAGNOSIS — I129 Hypertensive chronic kidney disease with stage 1 through stage 4 chronic kidney disease, or unspecified chronic kidney disease: Secondary | ICD-10-CM | POA: Diagnosis not present

## 2022-06-24 DIAGNOSIS — S72355D Nondisplaced comminuted fracture of shaft of left femur, subsequent encounter for closed fracture with routine healing: Secondary | ICD-10-CM | POA: Diagnosis not present

## 2022-06-24 DIAGNOSIS — Z9181 History of falling: Secondary | ICD-10-CM | POA: Diagnosis not present

## 2022-06-24 DIAGNOSIS — E1122 Type 2 diabetes mellitus with diabetic chronic kidney disease: Secondary | ICD-10-CM | POA: Diagnosis not present

## 2022-06-24 DIAGNOSIS — Z556 Problems related to health literacy: Secondary | ICD-10-CM | POA: Diagnosis not present

## 2022-06-24 DIAGNOSIS — Z8541 Personal history of malignant neoplasm of cervix uteri: Secondary | ICD-10-CM | POA: Diagnosis not present

## 2022-06-24 DIAGNOSIS — N1831 Chronic kidney disease, stage 3a: Secondary | ICD-10-CM | POA: Diagnosis not present

## 2022-06-24 DIAGNOSIS — Z96642 Presence of left artificial hip joint: Secondary | ICD-10-CM | POA: Diagnosis not present

## 2022-06-24 DIAGNOSIS — D631 Anemia in chronic kidney disease: Secondary | ICD-10-CM | POA: Diagnosis not present

## 2022-06-24 DIAGNOSIS — I129 Hypertensive chronic kidney disease with stage 1 through stage 4 chronic kidney disease, or unspecified chronic kidney disease: Secondary | ICD-10-CM | POA: Diagnosis not present

## 2022-06-25 DIAGNOSIS — D631 Anemia in chronic kidney disease: Secondary | ICD-10-CM | POA: Diagnosis not present

## 2022-06-25 DIAGNOSIS — E1122 Type 2 diabetes mellitus with diabetic chronic kidney disease: Secondary | ICD-10-CM | POA: Diagnosis not present

## 2022-06-25 DIAGNOSIS — N1831 Chronic kidney disease, stage 3a: Secondary | ICD-10-CM | POA: Diagnosis not present

## 2022-06-25 DIAGNOSIS — Z8541 Personal history of malignant neoplasm of cervix uteri: Secondary | ICD-10-CM | POA: Diagnosis not present

## 2022-06-25 DIAGNOSIS — Z9181 History of falling: Secondary | ICD-10-CM | POA: Diagnosis not present

## 2022-06-25 DIAGNOSIS — Z556 Problems related to health literacy: Secondary | ICD-10-CM | POA: Diagnosis not present

## 2022-06-25 DIAGNOSIS — Z96642 Presence of left artificial hip joint: Secondary | ICD-10-CM | POA: Diagnosis not present

## 2022-06-25 DIAGNOSIS — S72355D Nondisplaced comminuted fracture of shaft of left femur, subsequent encounter for closed fracture with routine healing: Secondary | ICD-10-CM | POA: Diagnosis not present

## 2022-06-25 DIAGNOSIS — I129 Hypertensive chronic kidney disease with stage 1 through stage 4 chronic kidney disease, or unspecified chronic kidney disease: Secondary | ICD-10-CM | POA: Diagnosis not present

## 2022-07-01 DIAGNOSIS — Z9181 History of falling: Secondary | ICD-10-CM | POA: Diagnosis not present

## 2022-07-01 DIAGNOSIS — D631 Anemia in chronic kidney disease: Secondary | ICD-10-CM | POA: Diagnosis not present

## 2022-07-01 DIAGNOSIS — E1122 Type 2 diabetes mellitus with diabetic chronic kidney disease: Secondary | ICD-10-CM | POA: Diagnosis not present

## 2022-07-01 DIAGNOSIS — Z8541 Personal history of malignant neoplasm of cervix uteri: Secondary | ICD-10-CM | POA: Diagnosis not present

## 2022-07-01 DIAGNOSIS — Z556 Problems related to health literacy: Secondary | ICD-10-CM | POA: Diagnosis not present

## 2022-07-01 DIAGNOSIS — S72355D Nondisplaced comminuted fracture of shaft of left femur, subsequent encounter for closed fracture with routine healing: Secondary | ICD-10-CM | POA: Diagnosis not present

## 2022-07-01 DIAGNOSIS — I129 Hypertensive chronic kidney disease with stage 1 through stage 4 chronic kidney disease, or unspecified chronic kidney disease: Secondary | ICD-10-CM | POA: Diagnosis not present

## 2022-07-01 DIAGNOSIS — Z96642 Presence of left artificial hip joint: Secondary | ICD-10-CM | POA: Diagnosis not present

## 2022-07-01 DIAGNOSIS — N1831 Chronic kidney disease, stage 3a: Secondary | ICD-10-CM | POA: Diagnosis not present

## 2022-08-12 DIAGNOSIS — Z8659 Personal history of other mental and behavioral disorders: Secondary | ICD-10-CM | POA: Insufficient documentation

## 2022-08-12 DIAGNOSIS — M810 Age-related osteoporosis without current pathological fracture: Secondary | ICD-10-CM | POA: Insufficient documentation

## 2022-08-13 ENCOUNTER — Ambulatory Visit: Payer: Medicare HMO | Admitting: Neurology

## 2022-08-14 DIAGNOSIS — Z6821 Body mass index (BMI) 21.0-21.9, adult: Secondary | ICD-10-CM | POA: Diagnosis not present

## 2022-08-14 DIAGNOSIS — S81802A Unspecified open wound, left lower leg, initial encounter: Secondary | ICD-10-CM | POA: Diagnosis not present

## 2022-08-14 DIAGNOSIS — Z23 Encounter for immunization: Secondary | ICD-10-CM | POA: Diagnosis not present

## 2022-09-17 DIAGNOSIS — N644 Mastodynia: Secondary | ICD-10-CM | POA: Diagnosis not present

## 2022-09-27 DIAGNOSIS — S81832D Puncture wound without foreign body, left lower leg, subsequent encounter: Secondary | ICD-10-CM | POA: Diagnosis not present

## 2022-09-27 DIAGNOSIS — Z23 Encounter for immunization: Secondary | ICD-10-CM | POA: Diagnosis not present

## 2022-09-27 DIAGNOSIS — Z6820 Body mass index (BMI) 20.0-20.9, adult: Secondary | ICD-10-CM | POA: Diagnosis not present

## 2022-10-07 DIAGNOSIS — N183 Chronic kidney disease, stage 3 unspecified: Secondary | ICD-10-CM | POA: Diagnosis not present

## 2022-10-07 DIAGNOSIS — Z4689 Encounter for fitting and adjustment of other specified devices: Secondary | ICD-10-CM | POA: Diagnosis not present

## 2022-10-07 DIAGNOSIS — E1136 Type 2 diabetes mellitus with diabetic cataract: Secondary | ICD-10-CM | POA: Diagnosis not present

## 2022-10-07 DIAGNOSIS — N952 Postmenopausal atrophic vaginitis: Secondary | ICD-10-CM | POA: Diagnosis not present

## 2022-10-07 DIAGNOSIS — E1122 Type 2 diabetes mellitus with diabetic chronic kidney disease: Secondary | ICD-10-CM | POA: Diagnosis not present

## 2022-10-07 DIAGNOSIS — M810 Age-related osteoporosis without current pathological fracture: Secondary | ICD-10-CM | POA: Diagnosis not present

## 2022-10-07 DIAGNOSIS — E785 Hyperlipidemia, unspecified: Secondary | ICD-10-CM | POA: Diagnosis not present

## 2022-10-07 DIAGNOSIS — Z8542 Personal history of malignant neoplasm of other parts of uterus: Secondary | ICD-10-CM | POA: Diagnosis not present

## 2022-10-07 DIAGNOSIS — E559 Vitamin D deficiency, unspecified: Secondary | ICD-10-CM | POA: Diagnosis not present

## 2022-11-14 IMAGING — MG DIGITAL DIAGNOSTIC BILAT W/ TOMO W/ CAD
8 series · 9 of 24 positions shown · non-contrast
Comparison: Previous exam(s).

CLINICAL DATA: Diffuse left breast pain for the past 11 years. She
also reports more recent left arm, left shoulder, left neck and left
back pain.

EXAM:
DIGITAL DIAGNOSTIC BILATERAL MAMMOGRAM WITH TOMO AND CAD

[R CC synth-2D]
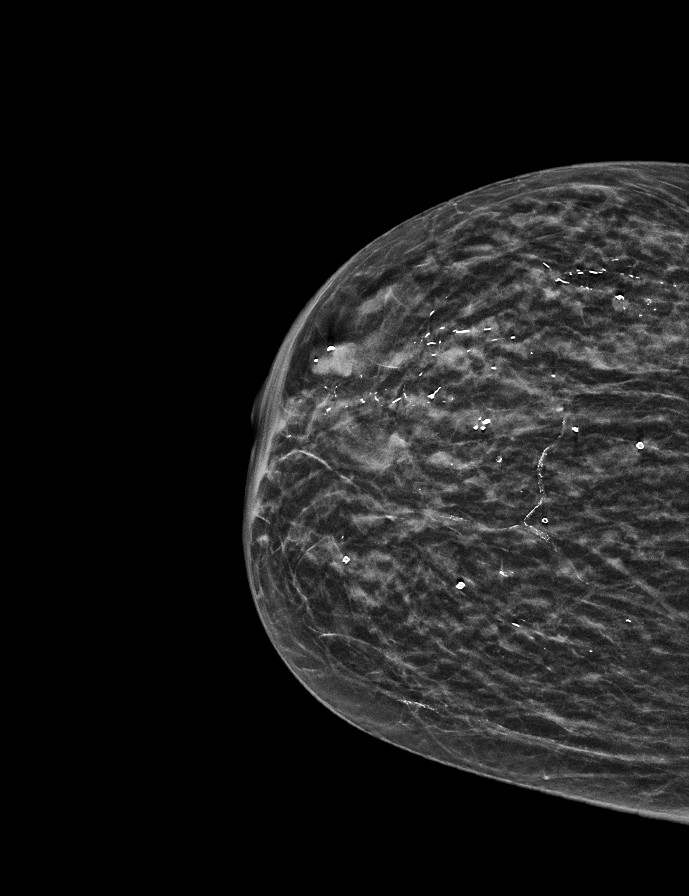

[L CC synth-2D]
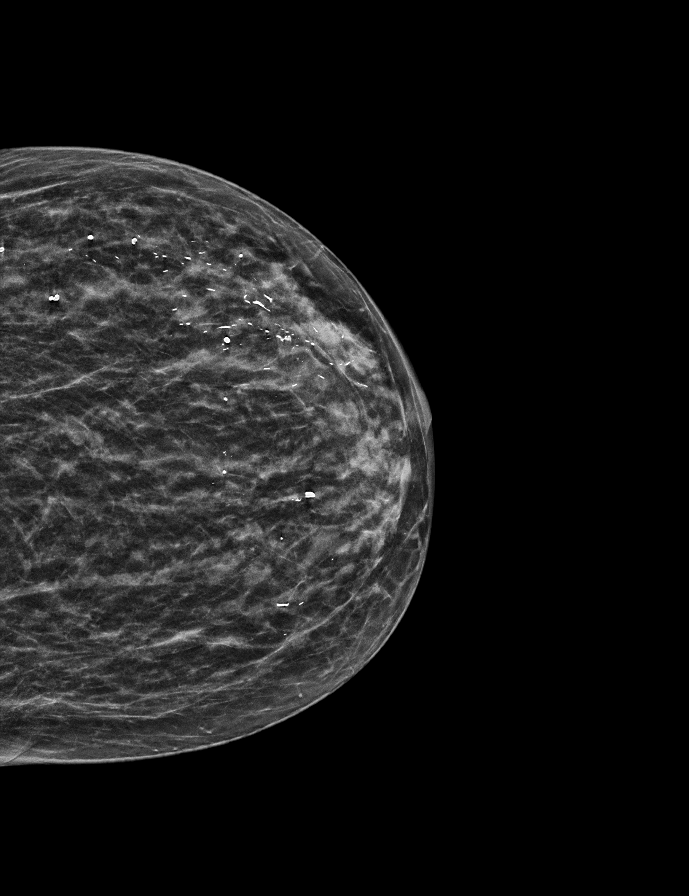

[L MLO synth-2D]
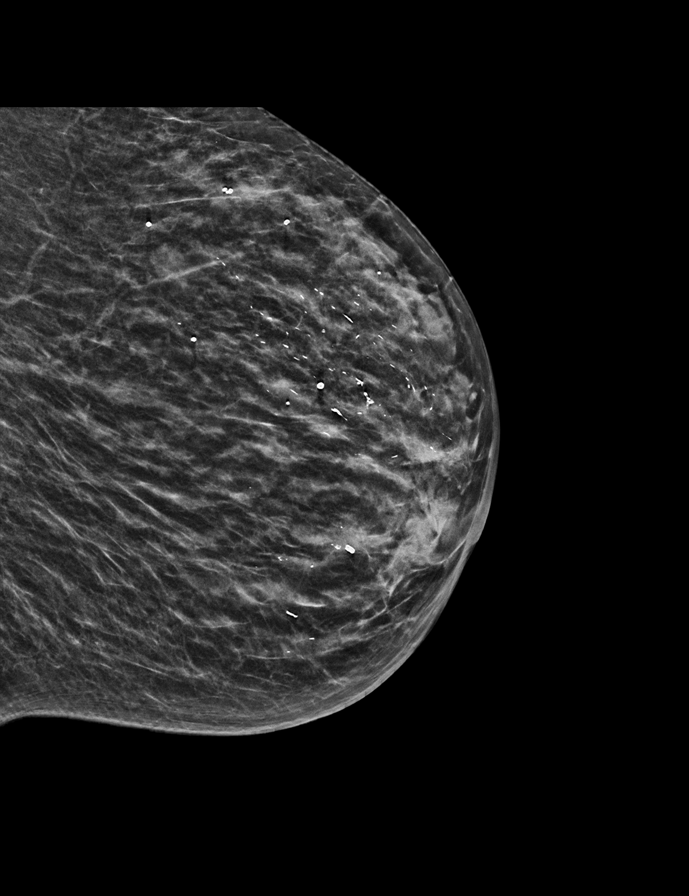

[R MLO synth-2D]
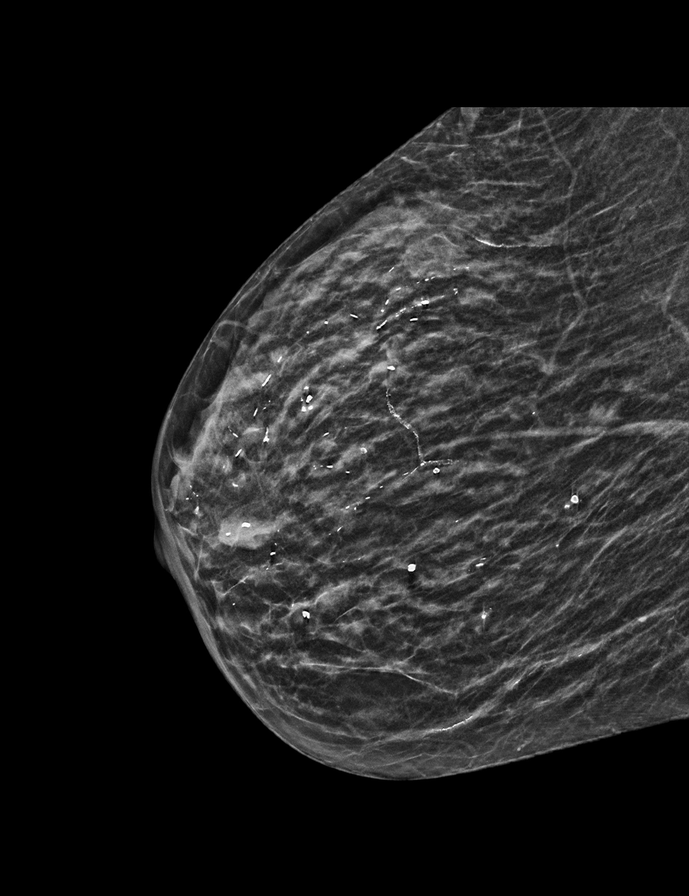

[R CC tomo · 2 of 33 frames shown]
[frame 11/33]
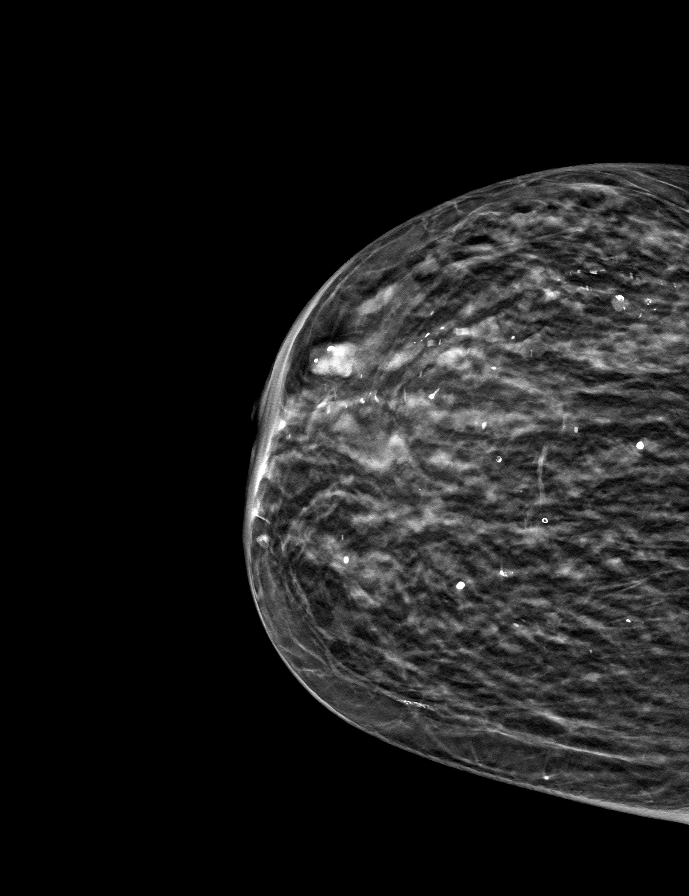
[frame 17/33]
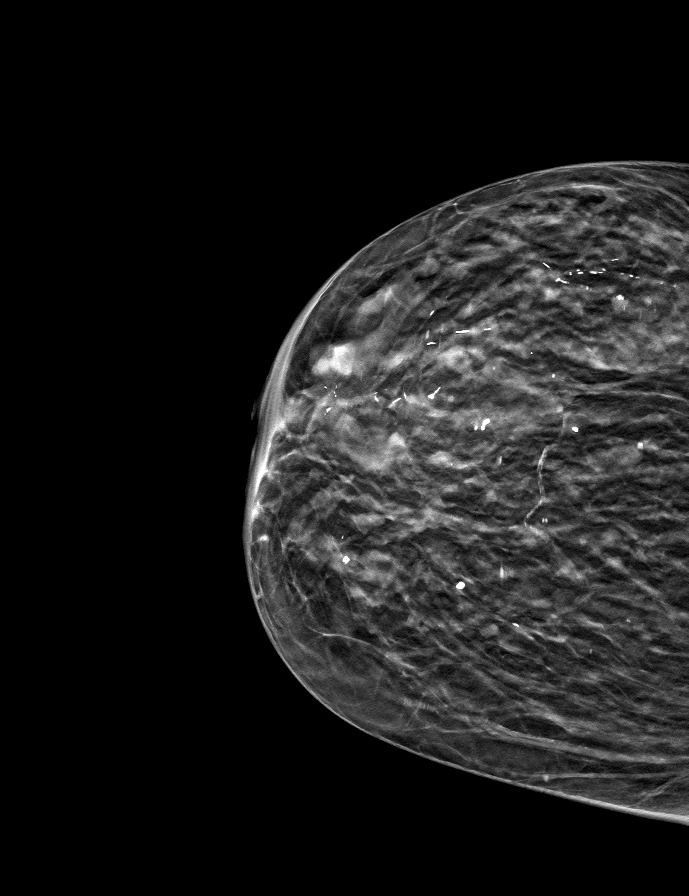

[L MLO tomo · tomo slice 20/39.0]
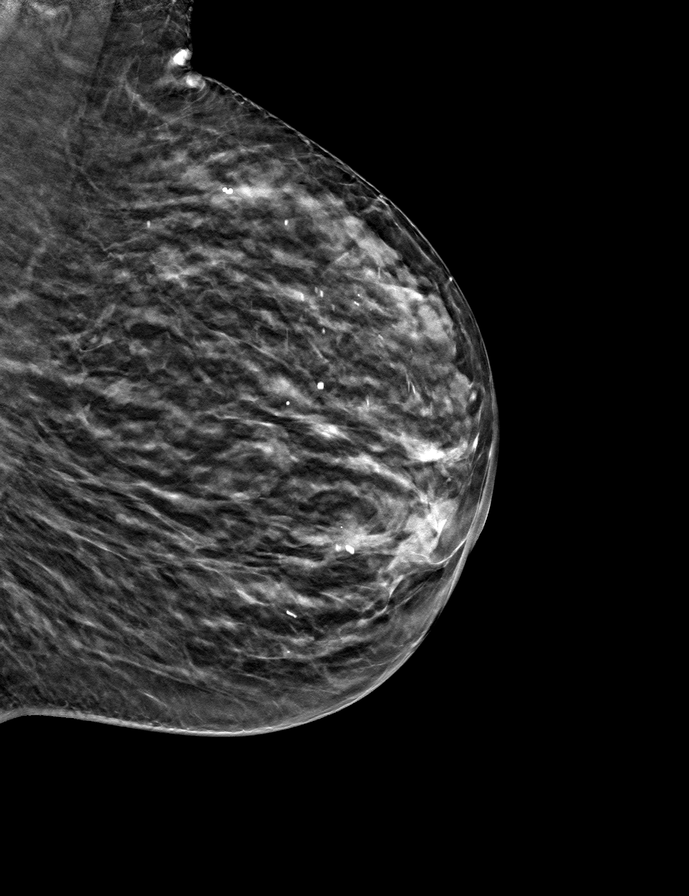

[L CC tomo · tomo slice 20/39.0]
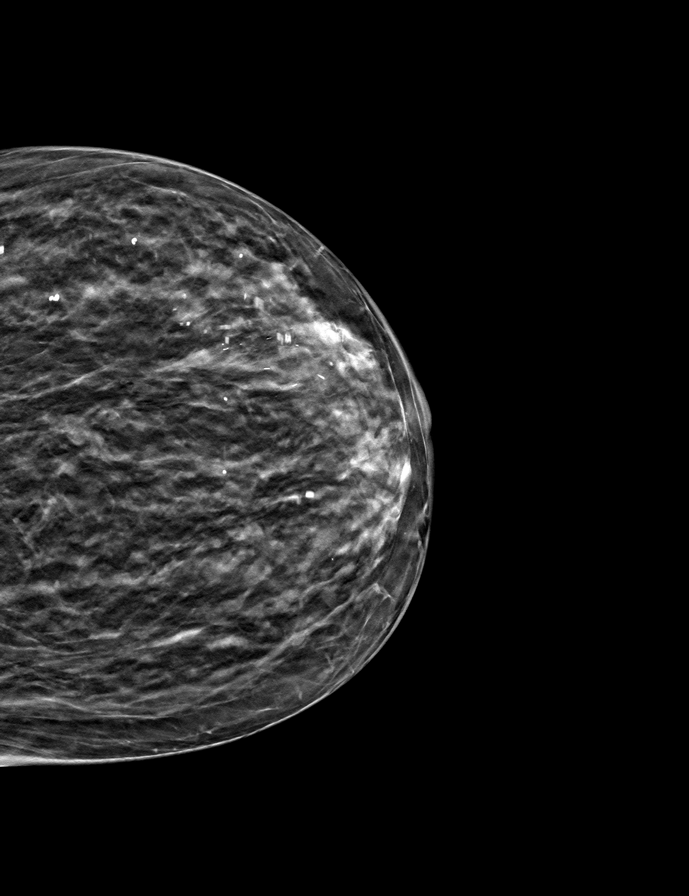

[R MLO tomo · tomo slice 19/36.0]
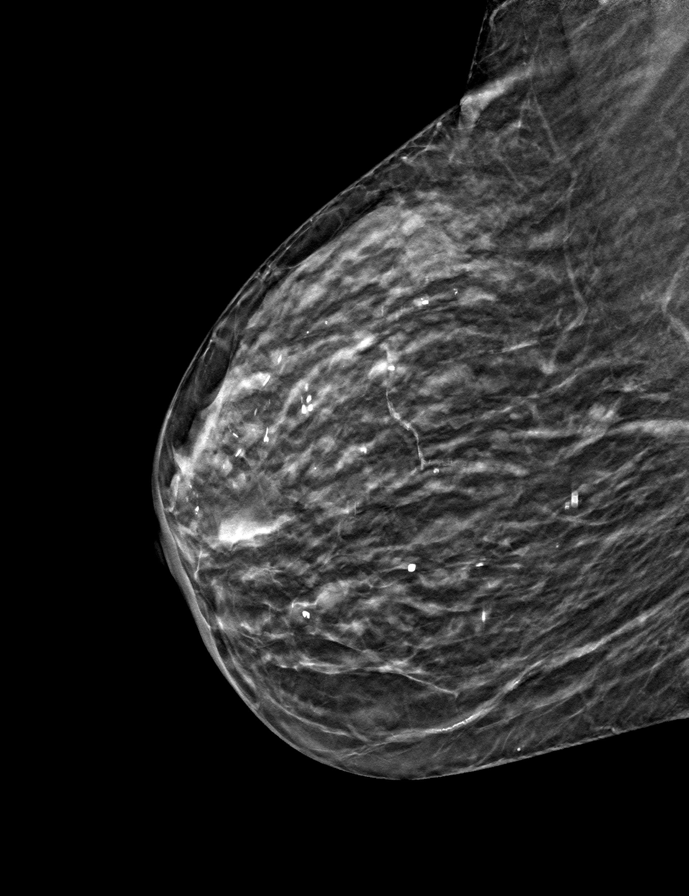

[9 of 24 positions shown; findings below may reference images not displayed]

ACR Breast Density Category c: The breast tissue is heterogeneously
dense, which may obscure small masses.
FINDINGS: Stable mammographic appearance of the breasts with no interval
findings suspicious for malignancy in either breast.

Mammographic images were processed with CAD.
IMPRESSION: No evidence of malignancy.

RECOMMENDATION:
Bilateral screening mammogram in 1 year.

I have discussed the findings and recommendations with the patient.
If applicable, a reminder letter will be sent to the patient
regarding the next appointment.

BI-RADS CATEGORY  1: Negative.

## 2022-11-22 DIAGNOSIS — Z23 Encounter for immunization: Secondary | ICD-10-CM | POA: Diagnosis not present

## 2022-11-22 DIAGNOSIS — Z2989 Encounter for other specified prophylactic measures: Secondary | ICD-10-CM | POA: Diagnosis not present

## 2022-11-22 DIAGNOSIS — Z789 Other specified health status: Secondary | ICD-10-CM | POA: Diagnosis not present

## 2022-12-11 DIAGNOSIS — E1122 Type 2 diabetes mellitus with diabetic chronic kidney disease: Secondary | ICD-10-CM | POA: Diagnosis not present

## 2022-12-11 DIAGNOSIS — I129 Hypertensive chronic kidney disease with stage 1 through stage 4 chronic kidney disease, or unspecified chronic kidney disease: Secondary | ICD-10-CM | POA: Diagnosis not present

## 2022-12-11 DIAGNOSIS — N1831 Chronic kidney disease, stage 3a: Secondary | ICD-10-CM | POA: Diagnosis not present

## 2023-01-14 DIAGNOSIS — E119 Type 2 diabetes mellitus without complications: Secondary | ICD-10-CM | POA: Diagnosis not present

## 2023-01-14 DIAGNOSIS — Z135 Encounter for screening for eye and ear disorders: Secondary | ICD-10-CM | POA: Diagnosis not present

## 2023-01-14 DIAGNOSIS — H5213 Myopia, bilateral: Secondary | ICD-10-CM | POA: Diagnosis not present

## 2023-01-14 DIAGNOSIS — H25813 Combined forms of age-related cataract, bilateral: Secondary | ICD-10-CM | POA: Diagnosis not present

## 2023-01-14 DIAGNOSIS — D3132 Benign neoplasm of left choroid: Secondary | ICD-10-CM | POA: Diagnosis not present

## 2023-03-05 ENCOUNTER — Ambulatory Visit: Payer: Medicare HMO | Admitting: Neurology

## 2023-04-07 DIAGNOSIS — M2041 Other hammer toe(s) (acquired), right foot: Secondary | ICD-10-CM | POA: Diagnosis not present

## 2023-04-07 DIAGNOSIS — L03031 Cellulitis of right toe: Secondary | ICD-10-CM | POA: Diagnosis not present

## 2023-04-07 DIAGNOSIS — L600 Ingrowing nail: Secondary | ICD-10-CM | POA: Diagnosis not present

## 2023-04-15 ENCOUNTER — Encounter: Payer: Self-pay | Admitting: Neurology

## 2023-04-15 ENCOUNTER — Telehealth: Payer: Self-pay | Admitting: Neurology

## 2023-04-15 ENCOUNTER — Ambulatory Visit (INDEPENDENT_AMBULATORY_CARE_PROVIDER_SITE_OTHER): Payer: Medicare HMO | Admitting: Neurology

## 2023-04-15 VITALS — BP 162/81 | HR 56 | Ht <= 58 in | Wt 107.5 lb

## 2023-04-15 DIAGNOSIS — F02B0 Dementia in other diseases classified elsewhere, moderate, without behavioral disturbance, psychotic disturbance, mood disturbance, and anxiety: Secondary | ICD-10-CM | POA: Diagnosis not present

## 2023-04-15 DIAGNOSIS — G301 Alzheimer's disease with late onset: Secondary | ICD-10-CM

## 2023-04-15 MED ORDER — CITALOPRAM HYDROBROMIDE 10 MG PO TABS: 10.0000 mg | ORAL_TABLET | Freq: Every day | ORAL | 3 refills | Status: DC

## 2023-04-15 MED ORDER — MEMANTINE HCL 10 MG PO TABS
10.0000 mg | ORAL_TABLET | Freq: Two times a day (BID) | ORAL | 3 refills | Status: AC
Start: 2023-04-15 — End: 2024-04-09
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
## 2023-04-15 NOTE — Patient Instructions (Addendum)
 Due to bradycardia noted on exam, I would not start acetylcholinesterase inhibitor. Will start memantine 10 mg nightly for 2 weeks then increase to 10 mg twice daily Will start Celexa 10 mg daily for agitation  Will check dementia labs including ATN, B12 and TSH.  MRI brain without contrast Continue your other medications Referral to home health services  Will complete FL2 form Continue to follow with PCP return as needed.   There are well-accepted and sensible ways to reduce risk for Alzheimers disease and other degenerative brain disorders .  Exercise Daily Walk A daily 20 minute walk should be part of your routine. Disease related apathy can be a significant roadblock to exercise and the only way to overcome this is to make it a daily routine and perhaps have a reward at the end (something your loved one loves to eat or drink perhaps) or a personal trainer coming to the home can also be very useful. Most importantly, the patient is much more likely to exercise if the caregiver / spouse does it with him/her. In general a structured, repetitive schedule is best.  General Health: Any diseases which effect your body will effect your brain such as a pneumonia, urinary infection, blood clot, heart attack or stroke. Keep contact with your primary care doctor for regular follow ups.  Sleep. A good nights sleep is healthy for the brain. Seven hours is recommended. If you have insomnia or poor sleep habits we can give you some instructions. If you have sleep apnea wear your mask.  Diet: Eating a heart healthy diet is also a good idea; fish and poultry instead of red meat, nuts (mostly non-peanuts), vegetables, fruits, olive oil or canola oil (instead of butter), minimal salt (use other spices to flavor foods), whole grain rice, bread, cereal and pasta and wine in moderation.Research is now showing that the MIND diet, which is a combination of The Mediterranean diet and the DASH diet, is beneficial for  cognitive processing and longevity. Information about this diet can be found in The MIND Diet, a book by Alonna Minium, MS, RDN, and online at [employer]  Finances, Power of [address] and Advance Directives: You should consider putting legal safeguards in place with regard to financial and medical decision making. While the spouse always has power of attorney for medical and financial issues in the absence of any form, you should consider what you want in case the spouse / caregiver is no longer around or capable of making decisions.

## 2023-04-15 NOTE — Telephone Encounter (Signed)
 [facility] is taking this patient.

## 2023-04-16 ENCOUNTER — Telehealth: Payer: Self-pay | Admitting: Anesthesiology

## 2023-04-17 ENCOUNTER — Telehealth: Payer: Self-pay | Admitting: Neurology

## 2023-04-17 DIAGNOSIS — Z556 Problems related to health literacy: Secondary | ICD-10-CM | POA: Diagnosis not present

## 2023-04-17 DIAGNOSIS — E785 Hyperlipidemia, unspecified: Secondary | ICD-10-CM | POA: Diagnosis not present

## 2023-04-17 DIAGNOSIS — N183 Chronic kidney disease, stage 3 unspecified: Secondary | ICD-10-CM | POA: Diagnosis not present

## 2023-04-17 DIAGNOSIS — E1122 Type 2 diabetes mellitus with diabetic chronic kidney disease: Secondary | ICD-10-CM | POA: Diagnosis not present

## 2023-04-17 DIAGNOSIS — D631 Anemia in chronic kidney disease: Secondary | ICD-10-CM | POA: Diagnosis not present

## 2023-04-17 DIAGNOSIS — F02B0 Dementia in other diseases classified elsewhere, moderate, without behavioral disturbance, psychotic disturbance, mood disturbance, and anxiety: Secondary | ICD-10-CM | POA: Diagnosis not present

## 2023-04-17 DIAGNOSIS — Z87440 Personal history of urinary (tract) infections: Secondary | ICD-10-CM | POA: Diagnosis not present

## 2023-04-17 DIAGNOSIS — I129 Hypertensive chronic kidney disease with stage 1 through stage 4 chronic kidney disease, or unspecified chronic kidney disease: Secondary | ICD-10-CM | POA: Diagnosis not present

## 2023-04-17 DIAGNOSIS — G301 Alzheimer's disease with late onset: Secondary | ICD-10-CM | POA: Diagnosis not present

## 2023-04-17 NOTE — Telephone Encounter (Signed)
 Lollie Sails, RN at [facility] has called for verbal orders.  She said point of contact will be Vikki Ports, RN [phone] vm) order for skilled nursing for 1 week 5 and order for Home health aid 1 week 5

## 2023-04-17 NOTE — Telephone Encounter (Signed)
 Returned call to [employer], verbal orders given as requested

## 2023-04-18 LAB — ATN PROFILE
A -- Beta-amyloid 42/40 Ratio: 0.102 — ABNORMAL LOW (ref >0.102)
Beta-amyloid 40: 301.8 pg/mL
Beta-amyloid 42: 30.86 pg/mL
N -- NfL, Plasma: 13.4 pg/mL — ABNORMAL HIGH (ref 0.00–7.64)
T -- p-tau181: 5.11 pg/mL — ABNORMAL HIGH (ref 0.00–0.97)

## 2023-04-18 LAB — VITAMIN B12: Vitamin B-12: 631 pg/mL (ref 232–1245)

## 2023-04-18 LAB — TSH: TSH: 2.35 u[IU]/mL (ref 0.450–4.500)

## 2023-04-20 ENCOUNTER — Encounter: Payer: Self-pay | Admitting: Neurology

## 2023-04-21 ENCOUNTER — Encounter: Payer: Self-pay | Admitting: Neurology

## 2023-04-21 DIAGNOSIS — E785 Hyperlipidemia, unspecified: Secondary | ICD-10-CM | POA: Diagnosis not present

## 2023-04-21 DIAGNOSIS — N183 Chronic kidney disease, stage 3 unspecified: Secondary | ICD-10-CM | POA: Diagnosis not present

## 2023-04-21 DIAGNOSIS — Z556 Problems related to health literacy: Secondary | ICD-10-CM | POA: Diagnosis not present

## 2023-04-21 DIAGNOSIS — Z87440 Personal history of urinary (tract) infections: Secondary | ICD-10-CM | POA: Diagnosis not present

## 2023-04-21 DIAGNOSIS — I129 Hypertensive chronic kidney disease with stage 1 through stage 4 chronic kidney disease, or unspecified chronic kidney disease: Secondary | ICD-10-CM | POA: Diagnosis not present

## 2023-04-21 DIAGNOSIS — F02B0 Dementia in other diseases classified elsewhere, moderate, without behavioral disturbance, psychotic disturbance, mood disturbance, and anxiety: Secondary | ICD-10-CM | POA: Diagnosis not present

## 2023-04-21 DIAGNOSIS — G301 Alzheimer's disease with late onset: Secondary | ICD-10-CM | POA: Diagnosis not present

## 2023-04-21 DIAGNOSIS — E1122 Type 2 diabetes mellitus with diabetic chronic kidney disease: Secondary | ICD-10-CM | POA: Diagnosis not present

## 2023-04-21 DIAGNOSIS — D631 Anemia in chronic kidney disease: Secondary | ICD-10-CM | POA: Diagnosis not present

## 2023-04-22 ENCOUNTER — Telehealth: Payer: Self-pay | Admitting: Neurology

## 2023-04-22 NOTE — Telephone Encounter (Signed)
 Left detailed vm that Dr. Teresa Coombs is agreeable to verbal orders for PT (balance and strengthening) 1 week 4 and 1 time a week every other week

## 2023-04-22 NOTE — Telephone Encounter (Signed)
 PT Jamie Macdonald w/ [facility] has called for verbal orders for PT (balance and strengthening) 1 week 4 and 1 time a week every other week, Sheila's vm is secure at [phone]

## 2023-04-23 ENCOUNTER — Telehealth: Payer: Self-pay | Admitting: Neurology

## 2023-04-23 DIAGNOSIS — G301 Alzheimer's disease with late onset: Secondary | ICD-10-CM | POA: Diagnosis not present

## 2023-04-23 DIAGNOSIS — F02B0 Dementia in other diseases classified elsewhere, moderate, without behavioral disturbance, psychotic disturbance, mood disturbance, and anxiety: Secondary | ICD-10-CM | POA: Diagnosis not present

## 2023-04-23 DIAGNOSIS — I129 Hypertensive chronic kidney disease with stage 1 through stage 4 chronic kidney disease, or unspecified chronic kidney disease: Secondary | ICD-10-CM | POA: Diagnosis not present

## 2023-04-23 DIAGNOSIS — E785 Hyperlipidemia, unspecified: Secondary | ICD-10-CM | POA: Diagnosis not present

## 2023-04-23 DIAGNOSIS — D631 Anemia in chronic kidney disease: Secondary | ICD-10-CM | POA: Diagnosis not present

## 2023-04-23 DIAGNOSIS — Z87440 Personal history of urinary (tract) infections: Secondary | ICD-10-CM | POA: Diagnosis not present

## 2023-04-23 DIAGNOSIS — Z556 Problems related to health literacy: Secondary | ICD-10-CM | POA: Diagnosis not present

## 2023-04-23 DIAGNOSIS — E1122 Type 2 diabetes mellitus with diabetic chronic kidney disease: Secondary | ICD-10-CM | POA: Diagnosis not present

## 2023-04-23 DIAGNOSIS — N183 Chronic kidney disease, stage 3 unspecified: Secondary | ICD-10-CM | POA: Diagnosis not present

## 2023-04-23 NOTE — Telephone Encounter (Signed)
 [facility]/Karen completed OT evaluation;a one time visit only. Daughter does not have any OT goals at this time and does not want patient to be overwhelmed with visits.

## 2023-04-23 NOTE — Telephone Encounter (Signed)
 Jamie Macdonald Harvey: [id number] exp. 04/23/23-06/22/23 sent to GI [phone]

## 2023-04-24 DIAGNOSIS — Z87440 Personal history of urinary (tract) infections: Secondary | ICD-10-CM | POA: Diagnosis not present

## 2023-04-24 DIAGNOSIS — N183 Chronic kidney disease, stage 3 unspecified: Secondary | ICD-10-CM | POA: Diagnosis not present

## 2023-04-24 DIAGNOSIS — F02B0 Dementia in other diseases classified elsewhere, moderate, without behavioral disturbance, psychotic disturbance, mood disturbance, and anxiety: Secondary | ICD-10-CM | POA: Diagnosis not present

## 2023-04-24 DIAGNOSIS — I129 Hypertensive chronic kidney disease with stage 1 through stage 4 chronic kidney disease, or unspecified chronic kidney disease: Secondary | ICD-10-CM | POA: Diagnosis not present

## 2023-04-24 DIAGNOSIS — G301 Alzheimer's disease with late onset: Secondary | ICD-10-CM | POA: Diagnosis not present

## 2023-04-24 DIAGNOSIS — D631 Anemia in chronic kidney disease: Secondary | ICD-10-CM | POA: Diagnosis not present

## 2023-04-24 DIAGNOSIS — E785 Hyperlipidemia, unspecified: Secondary | ICD-10-CM | POA: Diagnosis not present

## 2023-04-24 DIAGNOSIS — E1122 Type 2 diabetes mellitus with diabetic chronic kidney disease: Secondary | ICD-10-CM | POA: Diagnosis not present

## 2023-04-24 DIAGNOSIS — Z556 Problems related to health literacy: Secondary | ICD-10-CM | POA: Diagnosis not present

## 2023-04-25 ENCOUNTER — Encounter: Payer: Self-pay | Admitting: Neurology

## 2023-04-25 DIAGNOSIS — Z556 Problems related to health literacy: Secondary | ICD-10-CM | POA: Diagnosis not present

## 2023-04-25 DIAGNOSIS — E1122 Type 2 diabetes mellitus with diabetic chronic kidney disease: Secondary | ICD-10-CM | POA: Diagnosis not present

## 2023-04-25 DIAGNOSIS — E785 Hyperlipidemia, unspecified: Secondary | ICD-10-CM | POA: Diagnosis not present

## 2023-04-25 DIAGNOSIS — G301 Alzheimer's disease with late onset: Secondary | ICD-10-CM | POA: Diagnosis not present

## 2023-04-25 DIAGNOSIS — N183 Chronic kidney disease, stage 3 unspecified: Secondary | ICD-10-CM | POA: Diagnosis not present

## 2023-04-25 DIAGNOSIS — I129 Hypertensive chronic kidney disease with stage 1 through stage 4 chronic kidney disease, or unspecified chronic kidney disease: Secondary | ICD-10-CM | POA: Diagnosis not present

## 2023-04-25 DIAGNOSIS — Z87440 Personal history of urinary (tract) infections: Secondary | ICD-10-CM | POA: Diagnosis not present

## 2023-04-25 DIAGNOSIS — F02B0 Dementia in other diseases classified elsewhere, moderate, without behavioral disturbance, psychotic disturbance, mood disturbance, and anxiety: Secondary | ICD-10-CM | POA: Diagnosis not present

## 2023-04-25 DIAGNOSIS — D631 Anemia in chronic kidney disease: Secondary | ICD-10-CM | POA: Diagnosis not present

## 2023-04-25 NOTE — Telephone Encounter (Signed)
It all depends on the results

## 2023-04-30 NOTE — Telephone Encounter (Signed)
 Faxed home health orders to [facility] at [phone]

## 2023-05-01 DIAGNOSIS — G301 Alzheimer's disease with late onset: Secondary | ICD-10-CM | POA: Diagnosis not present

## 2023-05-01 DIAGNOSIS — I129 Hypertensive chronic kidney disease with stage 1 through stage 4 chronic kidney disease, or unspecified chronic kidney disease: Secondary | ICD-10-CM | POA: Diagnosis not present

## 2023-05-01 DIAGNOSIS — D631 Anemia in chronic kidney disease: Secondary | ICD-10-CM | POA: Diagnosis not present

## 2023-05-01 DIAGNOSIS — E1122 Type 2 diabetes mellitus with diabetic chronic kidney disease: Secondary | ICD-10-CM | POA: Diagnosis not present

## 2023-05-01 DIAGNOSIS — Z87440 Personal history of urinary (tract) infections: Secondary | ICD-10-CM | POA: Diagnosis not present

## 2023-05-01 DIAGNOSIS — Z556 Problems related to health literacy: Secondary | ICD-10-CM | POA: Diagnosis not present

## 2023-05-01 DIAGNOSIS — E785 Hyperlipidemia, unspecified: Secondary | ICD-10-CM | POA: Diagnosis not present

## 2023-05-01 DIAGNOSIS — F02B0 Dementia in other diseases classified elsewhere, moderate, without behavioral disturbance, psychotic disturbance, mood disturbance, and anxiety: Secondary | ICD-10-CM | POA: Diagnosis not present

## 2023-05-01 DIAGNOSIS — N183 Chronic kidney disease, stage 3 unspecified: Secondary | ICD-10-CM | POA: Diagnosis not present

## 2023-05-02 DIAGNOSIS — G301 Alzheimer's disease with late onset: Secondary | ICD-10-CM | POA: Diagnosis not present

## 2023-05-02 DIAGNOSIS — D631 Anemia in chronic kidney disease: Secondary | ICD-10-CM | POA: Diagnosis not present

## 2023-05-02 DIAGNOSIS — Z556 Problems related to health literacy: Secondary | ICD-10-CM | POA: Diagnosis not present

## 2023-05-02 DIAGNOSIS — F02B0 Dementia in other diseases classified elsewhere, moderate, without behavioral disturbance, psychotic disturbance, mood disturbance, and anxiety: Secondary | ICD-10-CM | POA: Diagnosis not present

## 2023-05-02 DIAGNOSIS — Z87440 Personal history of urinary (tract) infections: Secondary | ICD-10-CM | POA: Diagnosis not present

## 2023-05-02 DIAGNOSIS — N183 Chronic kidney disease, stage 3 unspecified: Secondary | ICD-10-CM | POA: Diagnosis not present

## 2023-05-02 DIAGNOSIS — E1122 Type 2 diabetes mellitus with diabetic chronic kidney disease: Secondary | ICD-10-CM | POA: Diagnosis not present

## 2023-05-02 DIAGNOSIS — E785 Hyperlipidemia, unspecified: Secondary | ICD-10-CM | POA: Diagnosis not present

## 2023-05-02 DIAGNOSIS — I129 Hypertensive chronic kidney disease with stage 1 through stage 4 chronic kidney disease, or unspecified chronic kidney disease: Secondary | ICD-10-CM | POA: Diagnosis not present

## 2023-05-07 DIAGNOSIS — N183 Chronic kidney disease, stage 3 unspecified: Secondary | ICD-10-CM | POA: Diagnosis not present

## 2023-05-07 DIAGNOSIS — I129 Hypertensive chronic kidney disease with stage 1 through stage 4 chronic kidney disease, or unspecified chronic kidney disease: Secondary | ICD-10-CM | POA: Diagnosis not present

## 2023-05-07 DIAGNOSIS — G301 Alzheimer's disease with late onset: Secondary | ICD-10-CM | POA: Diagnosis not present

## 2023-05-07 DIAGNOSIS — E1122 Type 2 diabetes mellitus with diabetic chronic kidney disease: Secondary | ICD-10-CM | POA: Diagnosis not present

## 2023-05-07 DIAGNOSIS — E785 Hyperlipidemia, unspecified: Secondary | ICD-10-CM | POA: Diagnosis not present

## 2023-05-07 DIAGNOSIS — D631 Anemia in chronic kidney disease: Secondary | ICD-10-CM | POA: Diagnosis not present

## 2023-05-07 DIAGNOSIS — F02B0 Dementia in other diseases classified elsewhere, moderate, without behavioral disturbance, psychotic disturbance, mood disturbance, and anxiety: Secondary | ICD-10-CM | POA: Diagnosis not present

## 2023-05-07 DIAGNOSIS — Z87440 Personal history of urinary (tract) infections: Secondary | ICD-10-CM | POA: Diagnosis not present

## 2023-05-07 DIAGNOSIS — Z556 Problems related to health literacy: Secondary | ICD-10-CM | POA: Diagnosis not present

## 2023-05-08 ENCOUNTER — Encounter: Payer: Self-pay | Admitting: Neurology

## 2023-05-09 ENCOUNTER — Ambulatory Visit
Admission: RE | Admit: 2023-05-09 | Discharge: 2023-05-09 | Disposition: A | Discharge status: Home / Self Care | Source: Ambulatory Visit | Attending: Neurology | Admitting: Neurology

## 2023-05-09 DIAGNOSIS — F0280 Dementia in other diseases classified elsewhere without behavioral disturbance: Secondary | ICD-10-CM | POA: Diagnosis not present

## 2023-05-09 DIAGNOSIS — G301 Alzheimer's disease with late onset: Secondary | ICD-10-CM | POA: Diagnosis not present

## 2023-05-14 ENCOUNTER — Encounter: Payer: Self-pay | Admitting: Neurology

## 2023-05-14 ENCOUNTER — Telehealth: Payer: Self-pay

## 2023-05-14 DIAGNOSIS — Z87440 Personal history of urinary (tract) infections: Secondary | ICD-10-CM | POA: Diagnosis not present

## 2023-05-14 DIAGNOSIS — G301 Alzheimer's disease with late onset: Secondary | ICD-10-CM | POA: Diagnosis not present

## 2023-05-14 DIAGNOSIS — I129 Hypertensive chronic kidney disease with stage 1 through stage 4 chronic kidney disease, or unspecified chronic kidney disease: Secondary | ICD-10-CM | POA: Diagnosis not present

## 2023-05-14 DIAGNOSIS — E785 Hyperlipidemia, unspecified: Secondary | ICD-10-CM | POA: Diagnosis not present

## 2023-05-14 DIAGNOSIS — F02B0 Dementia in other diseases classified elsewhere, moderate, without behavioral disturbance, psychotic disturbance, mood disturbance, and anxiety: Secondary | ICD-10-CM | POA: Diagnosis not present

## 2023-05-14 DIAGNOSIS — E1122 Type 2 diabetes mellitus with diabetic chronic kidney disease: Secondary | ICD-10-CM | POA: Diagnosis not present

## 2023-05-14 DIAGNOSIS — D631 Anemia in chronic kidney disease: Secondary | ICD-10-CM | POA: Diagnosis not present

## 2023-05-14 DIAGNOSIS — Z556 Problems related to health literacy: Secondary | ICD-10-CM | POA: Diagnosis not present

## 2023-05-14 DIAGNOSIS — N183 Chronic kidney disease, stage 3 unspecified: Secondary | ICD-10-CM | POA: Diagnosis not present

## 2023-05-14 NOTE — Telephone Encounter (Signed)
 Spoke w/pt daughter and stated:  The recent head CT did not show any acute findings, such as a stroke, or mass or blood products. There is atrophy of the brain and enlargement of the ventricle. No further action is required on this test at this time. Please remind patient to keep any upcoming appointments or tests and to call us  with any interim questions, concerns, problems or updates.   Pt daughter voiced understanding of result

## 2023-05-14 NOTE — Telephone Encounter (Signed)
-----   Message from [facility] sent at 05/14/2023 12:59 PM EDT ----- Please call and advise the patient/family that the recent head CT did not show any acute findings, such as a stroke, or mass or blood products. There is atrophy of the brain and enlargement of the ventricle. No further action is required on this test at this time. Please remind patient to keep any upcoming appointments or tests and to call us  with any interim questions, concerns, problems or updates. Thanks,   Cassandra Cleveland, MD

## 2023-05-14 NOTE — Progress Notes (Signed)
 Please call and advise the patient/family that the recent head CT did not show any acute findings, such as a stroke, or mass or blood products. There is atrophy of the brain and enlargement of the ventricle. No further action is required on this test at this time. Please remind patient to keep any upcoming appointments or tests and to call us  with any interim questions, concerns, problems or updates. Thanks,   Cassandra Cleveland, MD

## 2023-05-17 DIAGNOSIS — E785 Hyperlipidemia, unspecified: Secondary | ICD-10-CM | POA: Diagnosis not present

## 2023-05-17 DIAGNOSIS — E1122 Type 2 diabetes mellitus with diabetic chronic kidney disease: Secondary | ICD-10-CM | POA: Diagnosis not present

## 2023-05-17 DIAGNOSIS — G301 Alzheimer's disease with late onset: Secondary | ICD-10-CM | POA: Diagnosis not present

## 2023-05-17 DIAGNOSIS — N183 Chronic kidney disease, stage 3 unspecified: Secondary | ICD-10-CM | POA: Diagnosis not present

## 2023-05-17 DIAGNOSIS — I129 Hypertensive chronic kidney disease with stage 1 through stage 4 chronic kidney disease, or unspecified chronic kidney disease: Secondary | ICD-10-CM | POA: Diagnosis not present

## 2023-05-17 DIAGNOSIS — F02B0 Dementia in other diseases classified elsewhere, moderate, without behavioral disturbance, psychotic disturbance, mood disturbance, and anxiety: Secondary | ICD-10-CM | POA: Diagnosis not present

## 2023-05-17 DIAGNOSIS — D631 Anemia in chronic kidney disease: Secondary | ICD-10-CM | POA: Diagnosis not present

## 2023-05-17 DIAGNOSIS — Z556 Problems related to health literacy: Secondary | ICD-10-CM | POA: Diagnosis not present

## 2023-05-17 DIAGNOSIS — Z87440 Personal history of urinary (tract) infections: Secondary | ICD-10-CM | POA: Diagnosis not present

## 2023-05-20 ENCOUNTER — Telehealth: Payer: Self-pay

## 2023-05-20 NOTE — Telephone Encounter (Signed)
 Home [facility]

## 2023-05-23 DIAGNOSIS — N183 Chronic kidney disease, stage 3 unspecified: Secondary | ICD-10-CM | POA: Diagnosis not present

## 2023-05-23 DIAGNOSIS — F02B0 Dementia in other diseases classified elsewhere, moderate, without behavioral disturbance, psychotic disturbance, mood disturbance, and anxiety: Secondary | ICD-10-CM | POA: Diagnosis not present

## 2023-05-23 DIAGNOSIS — E785 Hyperlipidemia, unspecified: Secondary | ICD-10-CM | POA: Diagnosis not present

## 2023-05-23 DIAGNOSIS — E1122 Type 2 diabetes mellitus with diabetic chronic kidney disease: Secondary | ICD-10-CM | POA: Diagnosis not present

## 2023-05-23 DIAGNOSIS — D631 Anemia in chronic kidney disease: Secondary | ICD-10-CM | POA: Diagnosis not present

## 2023-05-23 DIAGNOSIS — I129 Hypertensive chronic kidney disease with stage 1 through stage 4 chronic kidney disease, or unspecified chronic kidney disease: Secondary | ICD-10-CM | POA: Diagnosis not present

## 2023-05-23 DIAGNOSIS — Z556 Problems related to health literacy: Secondary | ICD-10-CM | POA: Diagnosis not present

## 2023-05-23 DIAGNOSIS — G301 Alzheimer's disease with late onset: Secondary | ICD-10-CM | POA: Diagnosis not present

## 2023-05-23 DIAGNOSIS — Z87440 Personal history of urinary (tract) infections: Secondary | ICD-10-CM | POA: Diagnosis not present

## 2023-05-28 ENCOUNTER — Telehealth: Payer: Self-pay | Admitting: Neurology

## 2023-05-28 DIAGNOSIS — I129 Hypertensive chronic kidney disease with stage 1 through stage 4 chronic kidney disease, or unspecified chronic kidney disease: Secondary | ICD-10-CM | POA: Diagnosis not present

## 2023-05-28 DIAGNOSIS — E785 Hyperlipidemia, unspecified: Secondary | ICD-10-CM | POA: Diagnosis not present

## 2023-05-28 DIAGNOSIS — Z556 Problems related to health literacy: Secondary | ICD-10-CM | POA: Diagnosis not present

## 2023-05-28 DIAGNOSIS — E1122 Type 2 diabetes mellitus with diabetic chronic kidney disease: Secondary | ICD-10-CM | POA: Diagnosis not present

## 2023-05-28 DIAGNOSIS — G301 Alzheimer's disease with late onset: Secondary | ICD-10-CM | POA: Diagnosis not present

## 2023-05-28 DIAGNOSIS — F02B0 Dementia in other diseases classified elsewhere, moderate, without behavioral disturbance, psychotic disturbance, mood disturbance, and anxiety: Secondary | ICD-10-CM | POA: Diagnosis not present

## 2023-05-28 DIAGNOSIS — N183 Chronic kidney disease, stage 3 unspecified: Secondary | ICD-10-CM | POA: Diagnosis not present

## 2023-05-28 DIAGNOSIS — D631 Anemia in chronic kidney disease: Secondary | ICD-10-CM | POA: Diagnosis not present

## 2023-05-28 DIAGNOSIS — Z87440 Personal history of urinary (tract) infections: Secondary | ICD-10-CM | POA: Diagnosis not present

## 2023-05-28 NOTE — Telephone Encounter (Signed)
 Received updated form, bringing to POD 2

## 2023-05-28 NOTE — Telephone Encounter (Signed)
 Spoke with representative from [facility], stated she faxed over [facility] form on 5/8 at 2:20 PM that just needed to be signed and dated by Dr. Samara Crest. I inquired with debra and we had one that was signed and dated from 4/28, she said this was filled out incorrectly and needed additional info including printed MD name, our address and patient's emergency contact info, she has filled out everything else in addition to what we originally sent and just needs it to be signed, I checked with POD and medical records state they did not receive, asked her to resend. She is re-faxing to us  and states fax # to send back to will be on there for us .  FYI

## 2023-06-02 NOTE — Telephone Encounter (Signed)
 FAXED SIGNED FORM TO [phone]

## 2023-06-06 ENCOUNTER — Telehealth: Payer: Self-pay | Admitting: Neurology

## 2023-06-06 NOTE — Telephone Encounter (Signed)
 Jamie Macdonald from [facility] called wanting to inform the provider that they have been trying to reach the pt and the daughter and have had no response. They are wanting to discharge the pt. Please advise.

## 2023-06-07 DIAGNOSIS — Z111 Encounter for screening for respiratory tuberculosis: Secondary | ICD-10-CM | POA: Diagnosis not present

## 2023-06-10 DIAGNOSIS — Z681 Body mass index (BMI) 19 or less, adult: Secondary | ICD-10-CM | POA: Diagnosis not present

## 2023-06-10 DIAGNOSIS — Z111 Encounter for screening for respiratory tuberculosis: Secondary | ICD-10-CM | POA: Diagnosis not present

## 2023-06-10 NOTE — Telephone Encounter (Signed)
 Called and dc'd home health per Dr. Samara Crest.

## 2023-06-10 NOTE — Telephone Encounter (Signed)
 Please discharge patient if they are unable to do therapy at the senior living facility

## 2023-06-11 DIAGNOSIS — N183 Chronic kidney disease, stage 3 unspecified: Secondary | ICD-10-CM | POA: Diagnosis not present

## 2023-06-11 DIAGNOSIS — G301 Alzheimer's disease with late onset: Secondary | ICD-10-CM | POA: Diagnosis not present

## 2023-06-11 DIAGNOSIS — Z556 Problems related to health literacy: Secondary | ICD-10-CM | POA: Diagnosis not present

## 2023-06-11 DIAGNOSIS — D631 Anemia in chronic kidney disease: Secondary | ICD-10-CM | POA: Diagnosis not present

## 2023-06-11 DIAGNOSIS — E785 Hyperlipidemia, unspecified: Secondary | ICD-10-CM | POA: Diagnosis not present

## 2023-06-11 DIAGNOSIS — I129 Hypertensive chronic kidney disease with stage 1 through stage 4 chronic kidney disease, or unspecified chronic kidney disease: Secondary | ICD-10-CM | POA: Diagnosis not present

## 2023-06-11 DIAGNOSIS — Z87440 Personal history of urinary (tract) infections: Secondary | ICD-10-CM | POA: Diagnosis not present

## 2023-06-11 DIAGNOSIS — E1122 Type 2 diabetes mellitus with diabetic chronic kidney disease: Secondary | ICD-10-CM | POA: Diagnosis not present

## 2023-06-11 DIAGNOSIS — F02B0 Dementia in other diseases classified elsewhere, moderate, without behavioral disturbance, psychotic disturbance, mood disturbance, and anxiety: Secondary | ICD-10-CM | POA: Diagnosis not present

## 2023-06-11 NOTE — Telephone Encounter (Signed)
 Home health order faxed 06/10/23

## 2023-06-12 NOTE — Telephone Encounter (Signed)
 Received another call from [facility], states they faxed an addended FL2 form to us  yesterday checking to see if we got this. I do not see anything documented, checked in POD and nothing there either. She is refaxing

## 2023-06-17 NOTE — Telephone Encounter (Signed)
 Faxed back fl2 requesting pcp to complete per Dr. Samara Crest

## 2023-07-01 DIAGNOSIS — M79674 Pain in right toe(s): Secondary | ICD-10-CM | POA: Diagnosis not present

## 2023-07-01 DIAGNOSIS — L600 Ingrowing nail: Secondary | ICD-10-CM | POA: Diagnosis not present

## 2023-07-01 DIAGNOSIS — M79675 Pain in left toe(s): Secondary | ICD-10-CM | POA: Diagnosis not present

## 2023-07-08 DIAGNOSIS — E1122 Type 2 diabetes mellitus with diabetic chronic kidney disease: Secondary | ICD-10-CM | POA: Diagnosis not present

## 2023-07-08 DIAGNOSIS — F411 Generalized anxiety disorder: Secondary | ICD-10-CM | POA: Diagnosis not present

## 2023-07-08 DIAGNOSIS — N1832 Chronic kidney disease, stage 3b: Secondary | ICD-10-CM | POA: Diagnosis not present

## 2023-07-08 DIAGNOSIS — M810 Age-related osteoporosis without current pathological fracture: Secondary | ICD-10-CM | POA: Diagnosis not present

## 2023-07-08 DIAGNOSIS — I10 Essential (primary) hypertension: Secondary | ICD-10-CM | POA: Diagnosis not present

## 2023-07-08 DIAGNOSIS — G301 Alzheimer's disease with late onset: Secondary | ICD-10-CM | POA: Diagnosis not present

## 2023-07-09 DIAGNOSIS — Z9181 History of falling: Secondary | ICD-10-CM | POA: Diagnosis not present

## 2023-07-09 DIAGNOSIS — Z79899 Other long term (current) drug therapy: Secondary | ICD-10-CM | POA: Diagnosis not present

## 2023-07-09 DIAGNOSIS — G309 Alzheimer's disease, unspecified: Secondary | ICD-10-CM | POA: Diagnosis not present

## 2023-07-09 DIAGNOSIS — F02B0 Dementia in other diseases classified elsewhere, moderate, without behavioral disturbance, psychotic disturbance, mood disturbance, and anxiety: Secondary | ICD-10-CM | POA: Diagnosis not present

## 2023-07-09 DIAGNOSIS — R2689 Other abnormalities of gait and mobility: Secondary | ICD-10-CM | POA: Diagnosis not present

## 2023-07-11 DIAGNOSIS — G309 Alzheimer's disease, unspecified: Secondary | ICD-10-CM | POA: Diagnosis not present

## 2023-07-11 DIAGNOSIS — Z9181 History of falling: Secondary | ICD-10-CM | POA: Diagnosis not present

## 2023-07-11 DIAGNOSIS — Z79899 Other long term (current) drug therapy: Secondary | ICD-10-CM | POA: Diagnosis not present

## 2023-07-11 DIAGNOSIS — G301 Alzheimer's disease with late onset: Secondary | ICD-10-CM | POA: Diagnosis not present

## 2023-07-11 DIAGNOSIS — R2689 Other abnormalities of gait and mobility: Secondary | ICD-10-CM | POA: Diagnosis not present

## 2023-07-11 DIAGNOSIS — F02B11 Dementia in other diseases classified elsewhere, moderate, with agitation: Secondary | ICD-10-CM | POA: Diagnosis not present

## 2023-07-11 DIAGNOSIS — F411 Generalized anxiety disorder: Secondary | ICD-10-CM | POA: Diagnosis not present

## 2023-07-11 DIAGNOSIS — G4709 Other insomnia: Secondary | ICD-10-CM | POA: Diagnosis not present

## 2023-07-11 DIAGNOSIS — F02B0 Dementia in other diseases classified elsewhere, moderate, without behavioral disturbance, psychotic disturbance, mood disturbance, and anxiety: Secondary | ICD-10-CM | POA: Diagnosis not present

## 2023-07-11 DIAGNOSIS — F330 Major depressive disorder, recurrent, mild: Secondary | ICD-10-CM | POA: Diagnosis not present

## 2023-07-15 DIAGNOSIS — N390 Urinary tract infection, site not specified: Secondary | ICD-10-CM | POA: Diagnosis not present

## 2023-07-16 DIAGNOSIS — Z79899 Other long term (current) drug therapy: Secondary | ICD-10-CM | POA: Diagnosis not present

## 2023-07-16 DIAGNOSIS — Z9181 History of falling: Secondary | ICD-10-CM | POA: Diagnosis not present

## 2023-07-16 DIAGNOSIS — R2689 Other abnormalities of gait and mobility: Secondary | ICD-10-CM | POA: Diagnosis not present

## 2023-07-16 DIAGNOSIS — G309 Alzheimer's disease, unspecified: Secondary | ICD-10-CM | POA: Diagnosis not present

## 2023-07-16 DIAGNOSIS — F02B0 Dementia in other diseases classified elsewhere, moderate, without behavioral disturbance, psychotic disturbance, mood disturbance, and anxiety: Secondary | ICD-10-CM | POA: Diagnosis not present

## 2023-07-17 DIAGNOSIS — G4709 Other insomnia: Secondary | ICD-10-CM | POA: Diagnosis not present

## 2023-07-17 DIAGNOSIS — G301 Alzheimer's disease with late onset: Secondary | ICD-10-CM | POA: Diagnosis not present

## 2023-07-17 DIAGNOSIS — Z9181 History of falling: Secondary | ICD-10-CM | POA: Diagnosis not present

## 2023-07-17 DIAGNOSIS — R2689 Other abnormalities of gait and mobility: Secondary | ICD-10-CM | POA: Diagnosis not present

## 2023-07-17 DIAGNOSIS — G309 Alzheimer's disease, unspecified: Secondary | ICD-10-CM | POA: Diagnosis not present

## 2023-07-17 DIAGNOSIS — Z79899 Other long term (current) drug therapy: Secondary | ICD-10-CM | POA: Diagnosis not present

## 2023-07-17 DIAGNOSIS — F411 Generalized anxiety disorder: Secondary | ICD-10-CM | POA: Diagnosis not present

## 2023-07-17 DIAGNOSIS — F330 Major depressive disorder, recurrent, mild: Secondary | ICD-10-CM | POA: Diagnosis not present

## 2023-07-17 DIAGNOSIS — F02B0 Dementia in other diseases classified elsewhere, moderate, without behavioral disturbance, psychotic disturbance, mood disturbance, and anxiety: Secondary | ICD-10-CM | POA: Diagnosis not present

## 2023-07-22 DIAGNOSIS — G309 Alzheimer's disease, unspecified: Secondary | ICD-10-CM | POA: Diagnosis not present

## 2023-07-22 DIAGNOSIS — E559 Vitamin D deficiency, unspecified: Secondary | ICD-10-CM | POA: Diagnosis not present

## 2023-07-22 DIAGNOSIS — E1122 Type 2 diabetes mellitus with diabetic chronic kidney disease: Secondary | ICD-10-CM | POA: Diagnosis not present

## 2023-07-22 DIAGNOSIS — N1832 Chronic kidney disease, stage 3b: Secondary | ICD-10-CM | POA: Diagnosis not present

## 2023-07-22 DIAGNOSIS — R197 Diarrhea, unspecified: Secondary | ICD-10-CM | POA: Diagnosis not present

## 2023-07-22 DIAGNOSIS — I129 Hypertensive chronic kidney disease with stage 1 through stage 4 chronic kidney disease, or unspecified chronic kidney disease: Secondary | ICD-10-CM | POA: Diagnosis not present

## 2023-07-22 DIAGNOSIS — G301 Alzheimer's disease with late onset: Secondary | ICD-10-CM | POA: Diagnosis not present

## 2023-07-22 DIAGNOSIS — F02B11 Dementia in other diseases classified elsewhere, moderate, with agitation: Secondary | ICD-10-CM | POA: Diagnosis not present

## 2023-07-23 DIAGNOSIS — Z9181 History of falling: Secondary | ICD-10-CM | POA: Diagnosis not present

## 2023-07-23 DIAGNOSIS — R2689 Other abnormalities of gait and mobility: Secondary | ICD-10-CM | POA: Diagnosis not present

## 2023-07-23 DIAGNOSIS — F02B0 Dementia in other diseases classified elsewhere, moderate, without behavioral disturbance, psychotic disturbance, mood disturbance, and anxiety: Secondary | ICD-10-CM | POA: Diagnosis not present

## 2023-07-23 DIAGNOSIS — G309 Alzheimer's disease, unspecified: Secondary | ICD-10-CM | POA: Diagnosis not present

## 2023-07-23 DIAGNOSIS — Z79899 Other long term (current) drug therapy: Secondary | ICD-10-CM | POA: Diagnosis not present

## 2023-07-24 DIAGNOSIS — Z79899 Other long term (current) drug therapy: Secondary | ICD-10-CM | POA: Diagnosis not present

## 2023-07-24 DIAGNOSIS — R2689 Other abnormalities of gait and mobility: Secondary | ICD-10-CM | POA: Diagnosis not present

## 2023-07-24 DIAGNOSIS — Z9181 History of falling: Secondary | ICD-10-CM | POA: Diagnosis not present

## 2023-07-24 DIAGNOSIS — F02B0 Dementia in other diseases classified elsewhere, moderate, without behavioral disturbance, psychotic disturbance, mood disturbance, and anxiety: Secondary | ICD-10-CM | POA: Diagnosis not present

## 2023-07-24 DIAGNOSIS — G309 Alzheimer's disease, unspecified: Secondary | ICD-10-CM | POA: Diagnosis not present

## 2023-07-25 DIAGNOSIS — F330 Major depressive disorder, recurrent, mild: Secondary | ICD-10-CM | POA: Diagnosis not present

## 2023-07-28 ENCOUNTER — Other Ambulatory Visit: Payer: Self-pay | Admitting: Neurology

## 2023-07-30 DIAGNOSIS — F02B0 Dementia in other diseases classified elsewhere, moderate, without behavioral disturbance, psychotic disturbance, mood disturbance, and anxiety: Secondary | ICD-10-CM | POA: Diagnosis not present

## 2023-07-30 DIAGNOSIS — R2689 Other abnormalities of gait and mobility: Secondary | ICD-10-CM | POA: Diagnosis not present

## 2023-07-30 DIAGNOSIS — Z9181 History of falling: Secondary | ICD-10-CM | POA: Diagnosis not present

## 2023-07-30 DIAGNOSIS — G309 Alzheimer's disease, unspecified: Secondary | ICD-10-CM | POA: Diagnosis not present

## 2023-07-30 DIAGNOSIS — Z79899 Other long term (current) drug therapy: Secondary | ICD-10-CM | POA: Diagnosis not present

## 2023-07-31 DIAGNOSIS — M25511 Pain in right shoulder: Secondary | ICD-10-CM | POA: Diagnosis not present

## 2023-07-31 DIAGNOSIS — S199XXA Unspecified injury of neck, initial encounter: Secondary | ICD-10-CM | POA: Diagnosis not present

## 2023-07-31 DIAGNOSIS — M25512 Pain in left shoulder: Secondary | ICD-10-CM | POA: Diagnosis not present

## 2023-07-31 DIAGNOSIS — S0990XA Unspecified injury of head, initial encounter: Secondary | ICD-10-CM | POA: Diagnosis not present

## 2023-07-31 DIAGNOSIS — M542 Cervicalgia: Secondary | ICD-10-CM | POA: Diagnosis not present

## 2023-08-05 DIAGNOSIS — E559 Vitamin D deficiency, unspecified: Secondary | ICD-10-CM | POA: Diagnosis not present

## 2023-08-05 DIAGNOSIS — Z79899 Other long term (current) drug therapy: Secondary | ICD-10-CM | POA: Diagnosis not present

## 2023-08-05 DIAGNOSIS — Z9181 History of falling: Secondary | ICD-10-CM | POA: Diagnosis not present

## 2023-08-05 DIAGNOSIS — F02B0 Dementia in other diseases classified elsewhere, moderate, without behavioral disturbance, psychotic disturbance, mood disturbance, and anxiety: Secondary | ICD-10-CM | POA: Diagnosis not present

## 2023-08-05 DIAGNOSIS — E1122 Type 2 diabetes mellitus with diabetic chronic kidney disease: Secondary | ICD-10-CM | POA: Diagnosis not present

## 2023-08-05 DIAGNOSIS — G309 Alzheimer's disease, unspecified: Secondary | ICD-10-CM | POA: Diagnosis not present

## 2023-08-05 DIAGNOSIS — R2689 Other abnormalities of gait and mobility: Secondary | ICD-10-CM | POA: Diagnosis not present

## 2023-08-05 DIAGNOSIS — I10 Essential (primary) hypertension: Secondary | ICD-10-CM | POA: Diagnosis not present

## 2023-08-05 DIAGNOSIS — F02B11 Dementia in other diseases classified elsewhere, moderate, with agitation: Secondary | ICD-10-CM | POA: Diagnosis not present

## 2023-08-07 DIAGNOSIS — Z79899 Other long term (current) drug therapy: Secondary | ICD-10-CM | POA: Diagnosis not present

## 2023-08-07 DIAGNOSIS — Z9181 History of falling: Secondary | ICD-10-CM | POA: Diagnosis not present

## 2023-08-07 DIAGNOSIS — G309 Alzheimer's disease, unspecified: Secondary | ICD-10-CM | POA: Diagnosis not present

## 2023-08-07 DIAGNOSIS — F02B0 Dementia in other diseases classified elsewhere, moderate, without behavioral disturbance, psychotic disturbance, mood disturbance, and anxiety: Secondary | ICD-10-CM | POA: Diagnosis not present

## 2023-08-07 DIAGNOSIS — F330 Major depressive disorder, recurrent, mild: Secondary | ICD-10-CM | POA: Diagnosis not present

## 2023-08-07 DIAGNOSIS — R2689 Other abnormalities of gait and mobility: Secondary | ICD-10-CM | POA: Diagnosis not present

## 2023-08-12 DIAGNOSIS — E1122 Type 2 diabetes mellitus with diabetic chronic kidney disease: Secondary | ICD-10-CM | POA: Diagnosis not present

## 2023-08-12 DIAGNOSIS — G301 Alzheimer's disease with late onset: Secondary | ICD-10-CM | POA: Diagnosis not present

## 2023-08-12 DIAGNOSIS — N1832 Chronic kidney disease, stage 3b: Secondary | ICD-10-CM | POA: Diagnosis not present

## 2023-08-12 DIAGNOSIS — F02B11 Dementia in other diseases classified elsewhere, moderate, with agitation: Secondary | ICD-10-CM | POA: Diagnosis not present

## 2023-08-12 DIAGNOSIS — I129 Hypertensive chronic kidney disease with stage 1 through stage 4 chronic kidney disease, or unspecified chronic kidney disease: Secondary | ICD-10-CM | POA: Diagnosis not present

## 2023-08-12 DIAGNOSIS — E559 Vitamin D deficiency, unspecified: Secondary | ICD-10-CM | POA: Diagnosis not present

## 2023-08-12 DIAGNOSIS — E871 Hypo-osmolality and hyponatremia: Secondary | ICD-10-CM | POA: Diagnosis not present

## 2023-08-13 DIAGNOSIS — F02B0 Dementia in other diseases classified elsewhere, moderate, without behavioral disturbance, psychotic disturbance, mood disturbance, and anxiety: Secondary | ICD-10-CM | POA: Diagnosis not present

## 2023-08-13 DIAGNOSIS — G4709 Other insomnia: Secondary | ICD-10-CM | POA: Diagnosis not present

## 2023-08-13 DIAGNOSIS — Z9181 History of falling: Secondary | ICD-10-CM | POA: Diagnosis not present

## 2023-08-13 DIAGNOSIS — R2689 Other abnormalities of gait and mobility: Secondary | ICD-10-CM | POA: Diagnosis not present

## 2023-08-13 DIAGNOSIS — G309 Alzheimer's disease, unspecified: Secondary | ICD-10-CM | POA: Diagnosis not present

## 2023-08-13 DIAGNOSIS — G301 Alzheimer's disease with late onset: Secondary | ICD-10-CM | POA: Diagnosis not present

## 2023-08-13 DIAGNOSIS — Z79899 Other long term (current) drug therapy: Secondary | ICD-10-CM | POA: Diagnosis not present

## 2023-08-13 DIAGNOSIS — F411 Generalized anxiety disorder: Secondary | ICD-10-CM | POA: Diagnosis not present

## 2023-08-13 DIAGNOSIS — F330 Major depressive disorder, recurrent, mild: Secondary | ICD-10-CM | POA: Diagnosis not present

## 2023-08-13 DIAGNOSIS — F02B11 Dementia in other diseases classified elsewhere, moderate, with agitation: Secondary | ICD-10-CM | POA: Diagnosis not present

## 2023-08-14 DIAGNOSIS — F02B0 Dementia in other diseases classified elsewhere, moderate, without behavioral disturbance, psychotic disturbance, mood disturbance, and anxiety: Secondary | ICD-10-CM | POA: Diagnosis not present

## 2023-08-14 DIAGNOSIS — R262 Difficulty in walking, not elsewhere classified: Secondary | ICD-10-CM | POA: Diagnosis not present

## 2023-08-14 DIAGNOSIS — R079 Chest pain, unspecified: Secondary | ICD-10-CM | POA: Diagnosis not present

## 2023-08-14 DIAGNOSIS — I10 Essential (primary) hypertension: Secondary | ICD-10-CM | POA: Diagnosis not present

## 2023-08-14 DIAGNOSIS — M542 Cervicalgia: Secondary | ICD-10-CM | POA: Diagnosis not present

## 2023-08-14 DIAGNOSIS — G301 Alzheimer's disease with late onset: Secondary | ICD-10-CM | POA: Diagnosis not present

## 2023-08-14 DIAGNOSIS — R2689 Other abnormalities of gait and mobility: Secondary | ICD-10-CM | POA: Diagnosis not present

## 2023-08-14 DIAGNOSIS — R82998 Other abnormal findings in urine: Secondary | ICD-10-CM | POA: Diagnosis not present

## 2023-08-14 DIAGNOSIS — G309 Alzheimer's disease, unspecified: Secondary | ICD-10-CM | POA: Diagnosis not present

## 2023-08-14 DIAGNOSIS — R600 Localized edema: Secondary | ICD-10-CM | POA: Diagnosis not present

## 2023-08-14 DIAGNOSIS — F02B11 Dementia in other diseases classified elsewhere, moderate, with agitation: Secondary | ICD-10-CM | POA: Diagnosis not present

## 2023-08-14 DIAGNOSIS — M25551 Pain in right hip: Secondary | ICD-10-CM | POA: Diagnosis not present

## 2023-08-14 DIAGNOSIS — Z743 Need for continuous supervision: Secondary | ICD-10-CM | POA: Diagnosis not present

## 2023-08-14 DIAGNOSIS — S0990XA Unspecified injury of head, initial encounter: Secondary | ICD-10-CM | POA: Diagnosis not present

## 2023-08-14 DIAGNOSIS — Z9181 History of falling: Secondary | ICD-10-CM | POA: Diagnosis not present

## 2023-08-14 DIAGNOSIS — M7989 Other specified soft tissue disorders: Secondary | ICD-10-CM | POA: Diagnosis not present

## 2023-08-14 DIAGNOSIS — F330 Major depressive disorder, recurrent, mild: Secondary | ICD-10-CM | POA: Diagnosis not present

## 2023-08-14 DIAGNOSIS — Z79899 Other long term (current) drug therapy: Secondary | ICD-10-CM | POA: Diagnosis not present

## 2023-08-15 DIAGNOSIS — M25552 Pain in left hip: Secondary | ICD-10-CM | POA: Diagnosis not present

## 2023-08-15 DIAGNOSIS — I498 Other specified cardiac arrhythmias: Secondary | ICD-10-CM | POA: Diagnosis not present

## 2023-08-19 DIAGNOSIS — F02B11 Dementia in other diseases classified elsewhere, moderate, with agitation: Secondary | ICD-10-CM | POA: Diagnosis not present

## 2023-08-19 DIAGNOSIS — M25552 Pain in left hip: Secondary | ICD-10-CM | POA: Diagnosis not present

## 2023-08-19 DIAGNOSIS — W19XXXA Unspecified fall, initial encounter: Secondary | ICD-10-CM | POA: Diagnosis not present

## 2023-09-03 DIAGNOSIS — F330 Major depressive disorder, recurrent, mild: Secondary | ICD-10-CM | POA: Diagnosis not present

## 2023-09-06 DIAGNOSIS — U071 COVID-19: Secondary | ICD-10-CM | POA: Diagnosis not present

## 2023-09-06 DIAGNOSIS — R918 Other nonspecific abnormal finding of lung field: Secondary | ICD-10-CM | POA: Diagnosis not present

## 2023-09-06 DIAGNOSIS — R509 Fever, unspecified: Secondary | ICD-10-CM | POA: Diagnosis not present

## 2023-09-06 DIAGNOSIS — R531 Weakness: Secondary | ICD-10-CM | POA: Diagnosis not present

## 2023-09-07 DIAGNOSIS — R531 Weakness: Secondary | ICD-10-CM | POA: Diagnosis not present

## 2023-09-07 DIAGNOSIS — R918 Other nonspecific abnormal finding of lung field: Secondary | ICD-10-CM | POA: Diagnosis not present

## 2023-09-07 DIAGNOSIS — R410 Disorientation, unspecified: Secondary | ICD-10-CM | POA: Diagnosis not present

## 2023-09-07 DIAGNOSIS — Z743 Need for continuous supervision: Secondary | ICD-10-CM | POA: Diagnosis not present

## 2023-09-09 DIAGNOSIS — Z136 Encounter for screening for cardiovascular disorders: Secondary | ICD-10-CM | POA: Diagnosis not present

## 2023-09-09 DIAGNOSIS — U071 COVID-19: Secondary | ICD-10-CM | POA: Diagnosis not present

## 2023-09-11 DIAGNOSIS — F330 Major depressive disorder, recurrent, mild: Secondary | ICD-10-CM | POA: Diagnosis not present

## 2023-09-14 DIAGNOSIS — N1832 Chronic kidney disease, stage 3b: Secondary | ICD-10-CM | POA: Diagnosis not present

## 2023-09-14 DIAGNOSIS — I129 Hypertensive chronic kidney disease with stage 1 through stage 4 chronic kidney disease, or unspecified chronic kidney disease: Secondary | ICD-10-CM | POA: Diagnosis not present

## 2023-09-14 DIAGNOSIS — I509 Heart failure, unspecified: Secondary | ICD-10-CM | POA: Diagnosis not present

## 2023-09-14 DIAGNOSIS — F02B11 Dementia in other diseases classified elsewhere, moderate, with agitation: Secondary | ICD-10-CM | POA: Diagnosis not present

## 2023-09-14 DIAGNOSIS — E1122 Type 2 diabetes mellitus with diabetic chronic kidney disease: Secondary | ICD-10-CM | POA: Diagnosis not present

## 2023-09-14 DIAGNOSIS — E871 Hypo-osmolality and hyponatremia: Secondary | ICD-10-CM | POA: Diagnosis not present

## 2023-09-14 DIAGNOSIS — I110 Hypertensive heart disease with heart failure: Secondary | ICD-10-CM | POA: Diagnosis not present

## 2023-09-14 DIAGNOSIS — I5032 Chronic diastolic (congestive) heart failure: Secondary | ICD-10-CM | POA: Diagnosis not present

## 2023-09-23 DIAGNOSIS — U071 COVID-19: Secondary | ICD-10-CM | POA: Diagnosis not present

## 2023-09-25 DIAGNOSIS — R531 Weakness: Secondary | ICD-10-CM | POA: Diagnosis not present

## 2023-09-25 DIAGNOSIS — F411 Generalized anxiety disorder: Secondary | ICD-10-CM | POA: Diagnosis not present

## 2023-09-25 DIAGNOSIS — U071 COVID-19: Secondary | ICD-10-CM | POA: Diagnosis not present

## 2023-09-25 DIAGNOSIS — M810 Age-related osteoporosis without current pathological fracture: Secondary | ICD-10-CM | POA: Diagnosis not present

## 2023-09-25 DIAGNOSIS — E1122 Type 2 diabetes mellitus with diabetic chronic kidney disease: Secondary | ICD-10-CM | POA: Diagnosis not present

## 2023-09-25 DIAGNOSIS — N1832 Chronic kidney disease, stage 3b: Secondary | ICD-10-CM | POA: Diagnosis not present

## 2023-09-25 DIAGNOSIS — F0284 Dementia in other diseases classified elsewhere, unspecified severity, with anxiety: Secondary | ICD-10-CM | POA: Diagnosis not present

## 2023-09-25 DIAGNOSIS — I129 Hypertensive chronic kidney disease with stage 1 through stage 4 chronic kidney disease, or unspecified chronic kidney disease: Secondary | ICD-10-CM | POA: Diagnosis not present

## 2023-09-25 DIAGNOSIS — G301 Alzheimer's disease with late onset: Secondary | ICD-10-CM | POA: Diagnosis not present

## 2023-09-26 DIAGNOSIS — U071 COVID-19: Secondary | ICD-10-CM | POA: Diagnosis not present

## 2023-09-30 DIAGNOSIS — U071 COVID-19: Secondary | ICD-10-CM | POA: Diagnosis not present

## 2023-09-30 DIAGNOSIS — M810 Age-related osteoporosis without current pathological fracture: Secondary | ICD-10-CM | POA: Diagnosis not present

## 2023-09-30 DIAGNOSIS — I129 Hypertensive chronic kidney disease with stage 1 through stage 4 chronic kidney disease, or unspecified chronic kidney disease: Secondary | ICD-10-CM | POA: Diagnosis not present

## 2023-09-30 DIAGNOSIS — F0284 Dementia in other diseases classified elsewhere, unspecified severity, with anxiety: Secondary | ICD-10-CM | POA: Diagnosis not present

## 2023-09-30 DIAGNOSIS — N1832 Chronic kidney disease, stage 3b: Secondary | ICD-10-CM | POA: Diagnosis not present

## 2023-09-30 DIAGNOSIS — E1122 Type 2 diabetes mellitus with diabetic chronic kidney disease: Secondary | ICD-10-CM | POA: Diagnosis not present

## 2023-09-30 DIAGNOSIS — R531 Weakness: Secondary | ICD-10-CM | POA: Diagnosis not present

## 2023-09-30 DIAGNOSIS — G301 Alzheimer's disease with late onset: Secondary | ICD-10-CM | POA: Diagnosis not present

## 2023-09-30 DIAGNOSIS — F411 Generalized anxiety disorder: Secondary | ICD-10-CM | POA: Diagnosis not present

## 2023-10-01 DIAGNOSIS — U071 COVID-19: Secondary | ICD-10-CM | POA: Diagnosis not present

## 2023-10-01 DIAGNOSIS — F0284 Dementia in other diseases classified elsewhere, unspecified severity, with anxiety: Secondary | ICD-10-CM | POA: Diagnosis not present

## 2023-10-01 DIAGNOSIS — E1122 Type 2 diabetes mellitus with diabetic chronic kidney disease: Secondary | ICD-10-CM | POA: Diagnosis not present

## 2023-10-01 DIAGNOSIS — I129 Hypertensive chronic kidney disease with stage 1 through stage 4 chronic kidney disease, or unspecified chronic kidney disease: Secondary | ICD-10-CM | POA: Diagnosis not present

## 2023-10-01 DIAGNOSIS — F330 Major depressive disorder, recurrent, mild: Secondary | ICD-10-CM | POA: Diagnosis not present

## 2023-10-01 DIAGNOSIS — G301 Alzheimer's disease with late onset: Secondary | ICD-10-CM | POA: Diagnosis not present

## 2023-10-01 DIAGNOSIS — R531 Weakness: Secondary | ICD-10-CM | POA: Diagnosis not present

## 2023-10-01 DIAGNOSIS — F411 Generalized anxiety disorder: Secondary | ICD-10-CM | POA: Diagnosis not present

## 2023-10-01 DIAGNOSIS — F02B11 Dementia in other diseases classified elsewhere, moderate, with agitation: Secondary | ICD-10-CM | POA: Diagnosis not present

## 2023-10-01 DIAGNOSIS — G4709 Other insomnia: Secondary | ICD-10-CM | POA: Diagnosis not present

## 2023-10-01 DIAGNOSIS — N1832 Chronic kidney disease, stage 3b: Secondary | ICD-10-CM | POA: Diagnosis not present

## 2023-10-01 DIAGNOSIS — M810 Age-related osteoporosis without current pathological fracture: Secondary | ICD-10-CM | POA: Diagnosis not present

## 2023-10-02 DIAGNOSIS — F330 Major depressive disorder, recurrent, mild: Secondary | ICD-10-CM | POA: Diagnosis not present

## 2023-10-07 DIAGNOSIS — F411 Generalized anxiety disorder: Secondary | ICD-10-CM | POA: Diagnosis not present

## 2023-10-07 DIAGNOSIS — U071 COVID-19: Secondary | ICD-10-CM | POA: Diagnosis not present

## 2023-10-07 DIAGNOSIS — M810 Age-related osteoporosis without current pathological fracture: Secondary | ICD-10-CM | POA: Diagnosis not present

## 2023-10-07 DIAGNOSIS — R531 Weakness: Secondary | ICD-10-CM | POA: Diagnosis not present

## 2023-10-07 DIAGNOSIS — N1832 Chronic kidney disease, stage 3b: Secondary | ICD-10-CM | POA: Diagnosis not present

## 2023-10-07 DIAGNOSIS — I129 Hypertensive chronic kidney disease with stage 1 through stage 4 chronic kidney disease, or unspecified chronic kidney disease: Secondary | ICD-10-CM | POA: Diagnosis not present

## 2023-10-07 DIAGNOSIS — G301 Alzheimer's disease with late onset: Secondary | ICD-10-CM | POA: Diagnosis not present

## 2023-10-07 DIAGNOSIS — E1122 Type 2 diabetes mellitus with diabetic chronic kidney disease: Secondary | ICD-10-CM | POA: Diagnosis not present

## 2023-10-10 DIAGNOSIS — F0284 Dementia in other diseases classified elsewhere, unspecified severity, with anxiety: Secondary | ICD-10-CM | POA: Diagnosis not present

## 2023-10-10 DIAGNOSIS — M810 Age-related osteoporosis without current pathological fracture: Secondary | ICD-10-CM | POA: Diagnosis not present

## 2023-10-10 DIAGNOSIS — E1122 Type 2 diabetes mellitus with diabetic chronic kidney disease: Secondary | ICD-10-CM | POA: Diagnosis not present

## 2023-10-10 DIAGNOSIS — I129 Hypertensive chronic kidney disease with stage 1 through stage 4 chronic kidney disease, or unspecified chronic kidney disease: Secondary | ICD-10-CM | POA: Diagnosis not present

## 2023-10-10 DIAGNOSIS — R531 Weakness: Secondary | ICD-10-CM | POA: Diagnosis not present

## 2023-10-10 DIAGNOSIS — F411 Generalized anxiety disorder: Secondary | ICD-10-CM | POA: Diagnosis not present

## 2023-10-10 DIAGNOSIS — N1832 Chronic kidney disease, stage 3b: Secondary | ICD-10-CM | POA: Diagnosis not present

## 2023-10-10 DIAGNOSIS — U071 COVID-19: Secondary | ICD-10-CM | POA: Diagnosis not present

## 2023-10-14 DIAGNOSIS — I129 Hypertensive chronic kidney disease with stage 1 through stage 4 chronic kidney disease, or unspecified chronic kidney disease: Secondary | ICD-10-CM | POA: Diagnosis not present

## 2023-10-14 DIAGNOSIS — E1122 Type 2 diabetes mellitus with diabetic chronic kidney disease: Secondary | ICD-10-CM | POA: Diagnosis not present

## 2023-10-14 DIAGNOSIS — N1832 Chronic kidney disease, stage 3b: Secondary | ICD-10-CM | POA: Diagnosis not present

## 2023-10-14 DIAGNOSIS — F02B11 Dementia in other diseases classified elsewhere, moderate, with agitation: Secondary | ICD-10-CM | POA: Diagnosis not present

## 2023-10-22 DIAGNOSIS — E1122 Type 2 diabetes mellitus with diabetic chronic kidney disease: Secondary | ICD-10-CM | POA: Diagnosis not present

## 2023-10-22 DIAGNOSIS — U071 COVID-19: Secondary | ICD-10-CM | POA: Diagnosis not present

## 2023-10-22 DIAGNOSIS — F411 Generalized anxiety disorder: Secondary | ICD-10-CM | POA: Diagnosis not present

## 2023-10-22 DIAGNOSIS — I129 Hypertensive chronic kidney disease with stage 1 through stage 4 chronic kidney disease, or unspecified chronic kidney disease: Secondary | ICD-10-CM | POA: Diagnosis not present

## 2023-10-22 DIAGNOSIS — R531 Weakness: Secondary | ICD-10-CM | POA: Diagnosis not present

## 2023-10-22 DIAGNOSIS — M810 Age-related osteoporosis without current pathological fracture: Secondary | ICD-10-CM | POA: Diagnosis not present

## 2023-10-22 DIAGNOSIS — F0284 Dementia in other diseases classified elsewhere, unspecified severity, with anxiety: Secondary | ICD-10-CM | POA: Diagnosis not present

## 2023-10-22 DIAGNOSIS — N1832 Chronic kidney disease, stage 3b: Secondary | ICD-10-CM | POA: Diagnosis not present

## 2023-10-22 DIAGNOSIS — G301 Alzheimer's disease with late onset: Secondary | ICD-10-CM | POA: Diagnosis not present

## 2023-10-29 DIAGNOSIS — E1122 Type 2 diabetes mellitus with diabetic chronic kidney disease: Secondary | ICD-10-CM | POA: Diagnosis not present

## 2023-10-29 DIAGNOSIS — G301 Alzheimer's disease with late onset: Secondary | ICD-10-CM | POA: Diagnosis not present

## 2023-10-29 DIAGNOSIS — F411 Generalized anxiety disorder: Secondary | ICD-10-CM | POA: Diagnosis not present

## 2023-10-29 DIAGNOSIS — R531 Weakness: Secondary | ICD-10-CM | POA: Diagnosis not present

## 2023-10-29 DIAGNOSIS — U071 COVID-19: Secondary | ICD-10-CM | POA: Diagnosis not present

## 2023-10-29 DIAGNOSIS — M810 Age-related osteoporosis without current pathological fracture: Secondary | ICD-10-CM | POA: Diagnosis not present

## 2023-10-29 DIAGNOSIS — I129 Hypertensive chronic kidney disease with stage 1 through stage 4 chronic kidney disease, or unspecified chronic kidney disease: Secondary | ICD-10-CM | POA: Diagnosis not present

## 2023-10-29 DIAGNOSIS — N1832 Chronic kidney disease, stage 3b: Secondary | ICD-10-CM | POA: Diagnosis not present

## 2023-10-29 DIAGNOSIS — F0284 Dementia in other diseases classified elsewhere, unspecified severity, with anxiety: Secondary | ICD-10-CM | POA: Diagnosis not present

## 2023-11-04 DIAGNOSIS — I129 Hypertensive chronic kidney disease with stage 1 through stage 4 chronic kidney disease, or unspecified chronic kidney disease: Secondary | ICD-10-CM | POA: Diagnosis not present

## 2023-11-04 DIAGNOSIS — N1832 Chronic kidney disease, stage 3b: Secondary | ICD-10-CM | POA: Diagnosis not present

## 2023-11-04 DIAGNOSIS — U071 COVID-19: Secondary | ICD-10-CM | POA: Diagnosis not present

## 2023-11-04 DIAGNOSIS — F0284 Dementia in other diseases classified elsewhere, unspecified severity, with anxiety: Secondary | ICD-10-CM | POA: Diagnosis not present

## 2023-11-04 DIAGNOSIS — G301 Alzheimer's disease with late onset: Secondary | ICD-10-CM | POA: Diagnosis not present

## 2023-11-04 DIAGNOSIS — F411 Generalized anxiety disorder: Secondary | ICD-10-CM | POA: Diagnosis not present

## 2023-11-04 DIAGNOSIS — E1122 Type 2 diabetes mellitus with diabetic chronic kidney disease: Secondary | ICD-10-CM | POA: Diagnosis not present

## 2023-11-04 DIAGNOSIS — M810 Age-related osteoporosis without current pathological fracture: Secondary | ICD-10-CM | POA: Diagnosis not present
